# Patient Record
Sex: Male | Born: 1955 | Race: Black or African American | Hispanic: No | Marital: Single | Smoking: Former smoker
Health system: Southern US, Community
[De-identification: ages and names within clinical notes are randomized; demographics above are authoritative.]

## PROBLEM LIST (undated history)

## (undated) DIAGNOSIS — K4090 Unilateral inguinal hernia, without obstruction or gangrene, not specified as recurrent: Secondary | ICD-10-CM

## (undated) DIAGNOSIS — D849 Immunodeficiency, unspecified: Secondary | ICD-10-CM

## (undated) DIAGNOSIS — I219 Acute myocardial infarction, unspecified: Secondary | ICD-10-CM

## (undated) DIAGNOSIS — Z21 Asymptomatic human immunodeficiency virus [HIV] infection status: Secondary | ICD-10-CM

## (undated) DIAGNOSIS — B20 Human immunodeficiency virus [HIV] disease: Secondary | ICD-10-CM

## (undated) DIAGNOSIS — I2510 Atherosclerotic heart disease of native coronary artery without angina pectoris: Secondary | ICD-10-CM

## (undated) DIAGNOSIS — I509 Heart failure, unspecified: Secondary | ICD-10-CM

## (undated) DIAGNOSIS — E785 Hyperlipidemia, unspecified: Secondary | ICD-10-CM

## (undated) HISTORY — PX: COLONOSCOPY: SHX174

## (undated) HISTORY — DX: Hyperlipidemia, unspecified: E78.5

## (undated) HISTORY — PX: HERNIA REPAIR: SHX51

## (undated) HISTORY — PX: FRACTURE SURGERY: SHX138

## (undated) HISTORY — DX: Atherosclerotic heart disease of native coronary artery without angina pectoris: I25.10

## (undated) HISTORY — PX: CORONARY ANGIOPLASTY: SHX604

## (undated) HISTORY — DX: Heart failure, unspecified: I50.9

## (undated) HISTORY — PX: SURGERY SCROTAL / TESTICULAR: SUR1316

---

## 1999-11-29 ENCOUNTER — Encounter: Payer: Self-pay | Admitting: Emergency Medicine

## 1999-11-29 ENCOUNTER — Emergency Department (HOSPITAL_COMMUNITY): Admission: EM | Admit: 1999-11-29 | Discharge: 1999-11-29 | Payer: Self-pay | Admitting: Emergency Medicine

## 1999-12-01 ENCOUNTER — Ambulatory Visit (HOSPITAL_COMMUNITY): Admission: RE | Admit: 1999-12-01 | Discharge: 1999-12-02 | Payer: Self-pay | Admitting: Oral Surgery

## 2002-01-18 ENCOUNTER — Emergency Department (HOSPITAL_COMMUNITY): Admission: EM | Admit: 2002-01-18 | Discharge: 2002-01-18 | Payer: Self-pay | Admitting: Emergency Medicine

## 2002-01-18 ENCOUNTER — Encounter: Payer: Self-pay | Admitting: Family Medicine

## 2002-01-29 ENCOUNTER — Encounter: Admission: RE | Admit: 2002-01-29 | Discharge: 2002-01-29 | Payer: Self-pay | Admitting: Internal Medicine

## 2003-08-30 ENCOUNTER — Emergency Department (HOSPITAL_COMMUNITY): Admission: EM | Admit: 2003-08-30 | Discharge: 2003-08-30 | Payer: Self-pay | Admitting: Emergency Medicine

## 2004-11-09 ENCOUNTER — Inpatient Hospital Stay (HOSPITAL_COMMUNITY): Admission: EM | Admit: 2004-11-09 | Discharge: 2004-11-11 | Payer: Self-pay | Admitting: Emergency Medicine

## 2004-11-09 ENCOUNTER — Encounter: Payer: Self-pay | Admitting: Emergency Medicine

## 2005-03-18 ENCOUNTER — Emergency Department (HOSPITAL_COMMUNITY): Admission: EM | Admit: 2005-03-18 | Discharge: 2005-03-18 | Payer: Self-pay | Admitting: Emergency Medicine

## 2006-12-05 ENCOUNTER — Emergency Department (HOSPITAL_COMMUNITY): Admission: EM | Admit: 2006-12-05 | Discharge: 2006-12-05 | Payer: Self-pay | Admitting: Emergency Medicine

## 2007-03-11 ENCOUNTER — Emergency Department (HOSPITAL_COMMUNITY): Admission: EM | Admit: 2007-03-11 | Discharge: 2007-03-11 | Payer: Self-pay | Admitting: Emergency Medicine

## 2007-06-26 ENCOUNTER — Ambulatory Visit: Payer: Self-pay | Admitting: Family Medicine

## 2007-07-13 ENCOUNTER — Ambulatory Visit: Payer: Self-pay | Admitting: Internal Medicine

## 2007-08-10 ENCOUNTER — Ambulatory Visit: Payer: Self-pay | Admitting: Internal Medicine

## 2007-11-11 ENCOUNTER — Ambulatory Visit: Payer: Self-pay | Admitting: Internal Medicine

## 2008-02-10 ENCOUNTER — Ambulatory Visit: Payer: Self-pay | Admitting: Internal Medicine

## 2008-02-15 ENCOUNTER — Emergency Department (HOSPITAL_COMMUNITY): Admission: EM | Admit: 2008-02-15 | Discharge: 2008-02-15 | Payer: Self-pay | Admitting: Emergency Medicine

## 2008-05-10 ENCOUNTER — Emergency Department (HOSPITAL_COMMUNITY): Admission: EM | Admit: 2008-05-10 | Discharge: 2008-05-10 | Payer: Self-pay | Admitting: Emergency Medicine

## 2008-05-12 ENCOUNTER — Emergency Department (HOSPITAL_COMMUNITY): Admission: EM | Admit: 2008-05-12 | Discharge: 2008-05-12 | Payer: Self-pay | Admitting: Emergency Medicine

## 2008-05-25 ENCOUNTER — Ambulatory Visit (HOSPITAL_COMMUNITY): Admission: RE | Admit: 2008-05-25 | Discharge: 2008-05-26 | Payer: Self-pay | Admitting: Specialist

## 2008-08-11 ENCOUNTER — Encounter: Admission: RE | Admit: 2008-08-11 | Discharge: 2008-08-29 | Payer: Self-pay | Admitting: Specialist

## 2008-09-08 ENCOUNTER — Emergency Department (HOSPITAL_COMMUNITY): Admission: EM | Admit: 2008-09-08 | Discharge: 2008-09-08 | Payer: Self-pay | Admitting: Emergency Medicine

## 2009-03-26 ENCOUNTER — Emergency Department (HOSPITAL_COMMUNITY): Admission: EM | Admit: 2009-03-26 | Discharge: 2009-03-26 | Payer: Self-pay | Admitting: Emergency Medicine

## 2009-03-28 ENCOUNTER — Emergency Department (HOSPITAL_COMMUNITY): Admission: EM | Admit: 2009-03-28 | Discharge: 2009-03-29 | Payer: Self-pay | Admitting: Emergency Medicine

## 2009-06-05 ENCOUNTER — Emergency Department (HOSPITAL_COMMUNITY): Admission: EM | Admit: 2009-06-05 | Discharge: 2009-06-05 | Payer: Self-pay | Admitting: Emergency Medicine

## 2009-06-15 ENCOUNTER — Emergency Department (HOSPITAL_COMMUNITY): Admission: EM | Admit: 2009-06-15 | Discharge: 2009-06-15 | Payer: Self-pay | Admitting: Emergency Medicine

## 2009-07-04 ENCOUNTER — Emergency Department (HOSPITAL_COMMUNITY): Admission: EM | Admit: 2009-07-04 | Discharge: 2009-07-04 | Payer: Self-pay | Admitting: Emergency Medicine

## 2010-11-29 LAB — RAPID STREP SCREEN (MED CTR MEBANE ONLY): Streptococcus, Group A Screen (Direct): NEGATIVE

## 2010-12-01 LAB — POCT CARDIAC MARKERS
CKMB, poc: <1 ng/mL — ABNORMAL LOW (ref 1.0–8.0)
Myoglobin, poc: 106 ng/mL (ref 12–200)
Troponin i, poc: <0.05 ng/mL (ref 0.00–0.09)

## 2010-12-01 LAB — COMPREHENSIVE METABOLIC PANEL
ALT: 17 U/L (ref 0–53)
AST: 30 U/L (ref 0–37)
Albumin: 3.4 g/dL — ABNORMAL LOW (ref 3.5–5.2)
Alkaline Phosphatase: 88 U/L (ref 39–117)
BUN: 4 mg/dL — ABNORMAL LOW (ref 6–23)
CO2: 30 mEq/L (ref 19–32)
Calcium: 9.2 mg/dL (ref 8.4–10.5)
Calcium: 9.5 mg/dL (ref 8.4–10.5)
Chloride: 96 mEq/L (ref 96–112)
Creatinine, Ser: 0.74 mg/dL (ref 0.4–1.5)
GFR calc Af Amer: >60 mL/min (ref >60)
GFR calc Af Amer: >60 mL/min (ref >60)
GFR calc non Af Amer: >60 mL/min (ref >60)
Potassium: 3.6 mEq/L (ref 3.5–5.1)
Sodium: 136 mEq/L (ref 135–145)
Total Bilirubin: 0.8 mg/dL (ref 0.3–1.2)
Total Protein: 7.5 g/dL (ref 6.0–8.3)

## 2010-12-01 LAB — CBC
HCT: 38.3 % — ABNORMAL LOW (ref 39.0–52.0)
Hemoglobin: 13.2 g/dL (ref 13.0–17.0)
MCHC: 34.5 g/dL (ref 30.0–36.0)
MCV: 95.5 fL (ref 78.0–100.0)
Platelets: 310 10*3/uL (ref 150–400)
RBC: 4.01 MIL/uL — ABNORMAL LOW (ref 4.22–5.81)
RDW: 14.1 % (ref 11.5–15.5)
WBC: 5.2 10*3/uL (ref 4.0–10.5)

## 2010-12-01 LAB — DIFFERENTIAL
Basophils Absolute: 0 10*3/uL (ref 0.0–0.1)
Basophils Relative: 1 % (ref 0–1)
Eosinophils Absolute: 0.1 K/uL (ref 0.0–0.7)
Eosinophils Relative: 1 % (ref 0–5)
Lymphocytes Relative: 32 % (ref 12–46)
Lymphs Abs: 1.7 10*3/uL (ref 0.7–4.0)
Monocytes Absolute: 0.6 K/uL (ref 0.1–1.0)
Monocytes Relative: 12 % (ref 3–12)
Neutro Abs: 2.9 10*3/uL (ref 1.7–7.7)
Neutrophils Relative %: 54 % (ref 43–77)

## 2010-12-01 LAB — COMPREHENSIVE METABOLIC PANEL WITH GFR
ALT: 22 U/L (ref 0–53)
Albumin: 3.5 g/dL (ref 3.5–5.2)
Alkaline Phosphatase: 95 U/L (ref 39–117)
Glucose, Bld: 94 mg/dL (ref 70–99)
Potassium: 3.3 meq/L — ABNORMAL LOW (ref 3.5–5.1)
Sodium: 136 meq/L (ref 135–145)
Total Protein: 7.8 g/dL (ref 6.0–8.3)

## 2010-12-01 LAB — POCT I-STAT, CHEM 8
BUN: 5 mg/dL — ABNORMAL LOW (ref 6–23)
Calcium, Ion: 1.13 mmol/L (ref 1.12–1.32)
Chloride: 95 mEq/L — ABNORMAL LOW (ref 96–112)
Creatinine, Ser: 0.9 mg/dL (ref 0.4–1.5)
Glucose, Bld: 97 mg/dL (ref 70–99)
HCT: 41 % (ref 39.0–52.0)
Hemoglobin: 13.9 g/dL (ref 13.0–17.0)
Potassium: 3.4 mEq/L — ABNORMAL LOW (ref 3.5–5.1)
Sodium: 138 mEq/L (ref 135–145)
TCO2: 29 mmol/L (ref 0–100)

## 2010-12-10 LAB — DIFFERENTIAL
Basophils Absolute: 0 10*3/uL (ref 0.0–0.1)
Eosinophils Relative: 1 % (ref 0–5)
Lymphocytes Relative: 38 % (ref 12–46)
Lymphs Abs: 1.6 10*3/uL (ref 0.7–4.0)
Neutro Abs: 2 10*3/uL (ref 1.7–7.7)
Neutrophils Relative %: 49 % (ref 43–77)

## 2010-12-10 LAB — CBC
HCT: 42.6 % (ref 39.0–52.0)
Platelets: 304 10*3/uL (ref 150–400)
RDW: 14.9 % (ref 11.5–15.5)
WBC: 4.2 10*3/uL (ref 4.0–10.5)

## 2010-12-10 LAB — POCT I-STAT, CHEM 8
BUN: 4 mg/dL — ABNORMAL LOW (ref 6–23)
Potassium: 4.2 mEq/L (ref 3.5–5.1)
Sodium: 139 mEq/L (ref 135–145)
TCO2: 29 mmol/L (ref 0–100)

## 2010-12-10 LAB — HEMOCCULT GUIAC POC 1CARD (OFFICE): Fecal Occult Bld: POSITIVE

## 2011-01-08 NOTE — Op Note (Signed)
NAMELYMON, KIDNEY NO.:  [id number]   MEDICAL RECORD NO.:  [id number]          PATIENT TYPE:  OIB   LOCATION:  1538                         FACILITY:  [facility]   PHYSICIAN:  Jene Every, M.D.    DATE OF BIRTH:  [birth year]   DATE OF PROCEDURE:  05/25/2008  DATE OF DISCHARGE:                               OPERATIVE REPORT   PREOPERATIVE DIAGNOSIS:  Bimalleolar ankle fracture with ruptured  syndesmosis.   POSTOPERATIVE DIAGNOSIS:  Bimalleolar ankle fracture with ruptured  syndesmosis, acute on chronic fracture.   PROCEDURE PERFORMED:  1. Open reduction internal fixation bimalleolar ankle fracture.  2. Syndesmosis debridement of the ankle joint.   SURGEON:  Jene Every, M.D.   ASSISTANT:  Roma Schanz, P.A.   BRIEF HISTORY:  The patient is a 55 year old male who presented to the  acute care clinic with an acute injury to the right ankle.  Radiographs  indicated a medial malleolar displaced fracture with widened  syndesmosis, fracture of the fibula, question the acuity of the injury.  However, the patient denied any previous injury of the ankle.  It was  therefore felt to be acute.  It was indicated for open reduction  internal fixation.  Risks and benefits were discussed including  bleeding, infection, nonunion, malunion, need for revision, hardware  removal, DVT, PE and anesthetic complications, etc.   TECHNIQUE:  With the patient in supine position after adequate general  anesthesia, 2 grams Kefzol, the right lower extremity was prepped and  draped and exsanguinated in the usual sterile fashion.  Thigh tourniquet  inflated to 300 mmHg.  Curvilinear incision based upon the medial  malleolus was performed.  Subcutaneous tissue was dissected.  Electrocautery utilized to achieve hemostasis.  The periosteum over the  lateral malleolus was incised and subperiosteally elevated to expose the  fracture.  The fracture was just below the level of the plafond  and it  actually appeared to have chronic configuration to it.  We found the tip  of the medial malleolus in the joint.  It appeared to be chronic.  We  curetted the fracture ends of the tibia and of the fracture fragment.  We mobilized the fragment, skeletonized it though we left ligamentous  attachments to it.  This was then pinned to the medial malleolus  utilizing two 2-0 K-wires after transfixion with a tenaculum.  We  curetted the bone to bleeding tissue and then I used a tenaculum for  compression of the bone with expression of hematoma and then placed two  K-wires, clipped them and bent them over and impacting them into the  bone.  Prior to that we examined the joint.  I irrigated this and some  cartilaginous damage was noted.  On x-ray in the AP and lateral plane it  was found to be satisfactory.  We then used an incision over the distal  fibula.  There was small avulsion of the fibula so it would not require  ORIF.  The syndesmosis was widened and we therefore used the [employer].  After roughing up the syndesmosis we used  a tong to compress it and then  put a two hole plate and put two 0.35 screws, angled it from lateral to  medial, posterior to anterior with insertion of fully threaded cortical  screws 40 and 45 mm in length with excellent purchase.  The distal screw  had a smaller head for the cannulated screw.  We tried to change it out  but the screw head was stripped so we left it.  We felt that it was  adherent to the three hole plate that we placed on the lateral aspect of  the fibula using two of the distal holes.  With the ankle in  dorsiflexion provided compression and then released the [employer].  In  the AP and lateral plane there was excellent restoration of the mortise.  The wound was copiously irrigated and repaired the incision medially  with 2-0 Vicryl simple sutures and laterally in the skin with staples.  Tourniquet was deflated and there was adequate  vascularization of the  lower extremity appreciated.  A short-leg cast in neutral, well-padded  was then placed with fiberglass.  Cold water was used.  The patient was  then extubated without difficulty and transferred to the recovery room  in satisfactory condition.  The patient tolerated the procedure with no  complications.  Tourniquet time was an hour and 15 minutes.      Jene Every, M.D.  Electronically Signed     JB/MEDQ  D:  05/25/2008  T:  05/26/2008  Job:  [id number]
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
## 2011-05-23 LAB — POCT I-STAT, CHEM 8
BUN: 11
Calcium, Ion: 1.19
HCT: 48
Hemoglobin: 16.3
Sodium: 138
TCO2: 31

## 2011-05-23 LAB — CBC
HCT: 44.3
MCHC: 35.4
Platelets: 295
RBC: 4.73
RDW: 14.1
WBC: 5.5

## 2011-05-23 LAB — DIFFERENTIAL
Basophils Absolute: 0
Basophils Relative: 1
Eosinophils Relative: 1
Lymphocytes Relative: 38

## 2011-05-23 LAB — GC/CHLAMYDIA PROBE AMP, GENITAL
Chlamydia, DNA Probe: NEGATIVE
GC Probe Amp, Genital: NEGATIVE

## 2011-05-27 LAB — DIFFERENTIAL
Basophils Relative: 1
Eosinophils Absolute: 0
Lymphs Abs: 1.8
Monocytes Absolute: 0.6
Monocytes Relative: 12
Neutrophils Relative %: 47

## 2011-05-27 LAB — COMPREHENSIVE METABOLIC PANEL WITH GFR
ALT: 20
AST: 22
Albumin: 3.8
Alkaline Phosphatase: 91
BUN: 9
CO2: 29
Calcium: 9.5
Chloride: 105
Creatinine, Ser: 0.72
GFR calc non Af Amer: >60
Glucose, Bld: 113 — ABNORMAL HIGH
Potassium: 4.1
Sodium: 137
Total Bilirubin: 1.2
Total Protein: 7.6

## 2011-05-27 LAB — CBC
MCHC: 34.2
Platelets: 398
RDW: 13.9

## 2011-09-07 ENCOUNTER — Encounter (HOSPITAL_COMMUNITY): Payer: Self-pay

## 2011-09-07 ENCOUNTER — Emergency Department (HOSPITAL_COMMUNITY)
Admission: EM | Admit: 2011-09-07 | Discharge: 2011-09-07 | Disposition: A | Discharge status: Home / Self Care | Payer: Medicare Other | Attending: Emergency Medicine | Admitting: Emergency Medicine

## 2011-09-07 DIAGNOSIS — H6590 Unspecified nonsuppurative otitis media, unspecified ear: Secondary | ICD-10-CM | POA: Insufficient documentation

## 2011-09-07 DIAGNOSIS — H6591 Unspecified nonsuppurative otitis media, right ear: Secondary | ICD-10-CM

## 2011-09-07 DIAGNOSIS — J3489 Other specified disorders of nose and nasal sinuses: Secondary | ICD-10-CM | POA: Insufficient documentation

## 2011-09-07 DIAGNOSIS — R599 Enlarged lymph nodes, unspecified: Secondary | ICD-10-CM | POA: Insufficient documentation

## 2011-09-07 DIAGNOSIS — H9209 Otalgia, unspecified ear: Secondary | ICD-10-CM | POA: Insufficient documentation

## 2011-09-07 DIAGNOSIS — R0982 Postnasal drip: Secondary | ICD-10-CM | POA: Insufficient documentation

## 2011-09-07 NOTE — ED Notes (Signed)
Pt in from home with right ear discomfort x1 week states feels as if its clogged denies drainage

## 2011-09-07 NOTE — ED Provider Notes (Signed)
History     CSN: [id number]  Arrival date & time 09/07/11  1528   First MD Initiated Contact with Patient 09/07/11 1904      Chief Complaint  Patient presents with  . Ear Fullness    (Consider location/radiation/quality/duration/timing/severity/associated sxs/prior treatment) Patient is a 56 y.o. male presenting with plugged ear sensation. The history is provided by the patient.  Ear Fullness This is a new problem. The current episode started in the past 7 days. The problem occurs constantly. The problem has been unchanged. Associated symptoms include congestion and swollen glands. Pertinent negatives include no chest pain, chills, coughing, fever, headaches, neck pain, rash or sore throat. Associated symptoms comments: Positive nasal congestion, rhinorrhea, PND. The symptoms are aggravated by nothing. Treatments tried: "popping" ears. The treatment provided no relief.  Ear Fullness This is a new problem. The current episode started in the past 7 days. The problem occurs constantly. The problem has been unchanged. Pertinent negatives include no chest pain and no headaches. Associated symptoms comments: Positive nasal congestion, rhinorrhea, PND. The symptoms are aggravated by nothing. Treatments tried: "popping" ears. The treatment provided no relief.    History reviewed. No pertinent past medical history.  History reviewed. No pertinent past surgical history.  No family history on file.  History  Substance Use Topics  . Smoking status: Current Some Day Smoker  . Smokeless tobacco: Not on file  . Alcohol Use: Yes      Review of Systems  Constitutional: Negative for fever and chills.  HENT: Positive for ear pain, congestion, rhinorrhea and postnasal drip. Negative for hearing loss, sore throat, neck pain, tinnitus and ear discharge.   Respiratory: Negative for cough and chest tightness.   Cardiovascular: Negative for chest pain.  Skin: Negative for rash.  Neurological:  Negative for headaches.    Allergies  Review of patient's allergies indicates no known allergies.  Home Medications   Current Outpatient Rx  Name Route Sig Dispense Refill  . CYCLOBENZAPRINE HCL 10 MG PO TABS Oral Take 10 mg by mouth 3 (three) times daily as needed. For back and shoulder pain    . EFAVIRENZ-EMTRICITAB-TENOFOVIR 600-200-300 MG PO TABS Oral Take 1 tablet by mouth at bedtime.    . OXYCODONE-ACETAMINOPHEN 10-325 MG PO TABS Oral Take 1 tablet by mouth every 8 (eight) hours as needed. For pain      BP 112/79  Pulse 79  Temp(Src) 98.6 F (37 C) (Oral)  SpO2 98%  Physical Exam  Nursing note and vitals reviewed. Constitutional: He is oriented to person, place, and time. He appears well-developed and well-nourished. No distress.  HENT:  Head: Normocephalic and atraumatic.  Nose: Nose normal.  Mouth/Throat: Oropharynx is clear and moist. No oropharyngeal exudate.       Right TM with serous effusion, no erythema  Eyes: Conjunctivae are normal. Pupils are equal, round, and reactive to light.  Neck: Normal range of motion. Neck supple.  Cardiovascular: Normal rate and regular rhythm.   Pulmonary/Chest: Effort normal. No respiratory distress.  Abdominal: Soft. He exhibits no distension. There is no tenderness.  Musculoskeletal: He exhibits no edema and no tenderness.  Lymphadenopathy:    He has no cervical adenopathy.  Neurological: He is alert and oriented to person, place, and time. No cranial nerve deficit. Coordination normal.  Skin: Skin is warm and dry. No rash noted.    ED Course  Procedures (including critical care time)  Labs Reviewed - No data to display No results found.  MDM  OM with effusion, NO abx necessary. Discussed use of OTC decongestant    Medical screening examination/treatment/procedure(s) were performed by non-physician practitioner and as supervising physician I was immediately available for consultation/collaboration. Osvaldo Human, M.D.     [address] [place], [place] 09/07/11 1927  Carleene Cooper III, MD 09/08/11 [phone]

## 2011-09-23 DIAGNOSIS — D013 Carcinoma in situ of anus and anal canal: Secondary | ICD-10-CM | POA: Insufficient documentation

## 2012-12-09 ENCOUNTER — Encounter (HOSPITAL_COMMUNITY): Payer: Self-pay | Admitting: Emergency Medicine

## 2012-12-09 ENCOUNTER — Emergency Department (HOSPITAL_COMMUNITY)
Admission: EM | Admit: 2012-12-09 | Discharge: 2012-12-09 | Disposition: A | Discharge status: Home / Self Care | Payer: Medicare Other | Attending: Emergency Medicine | Admitting: Emergency Medicine

## 2012-12-09 DIAGNOSIS — K6289 Other specified diseases of anus and rectum: Secondary | ICD-10-CM | POA: Insufficient documentation

## 2012-12-09 DIAGNOSIS — F17200 Nicotine dependence, unspecified, uncomplicated: Secondary | ICD-10-CM | POA: Insufficient documentation

## 2012-12-09 DIAGNOSIS — Z21 Asymptomatic human immunodeficiency virus [HIV] infection status: Secondary | ICD-10-CM | POA: Insufficient documentation

## 2012-12-09 DIAGNOSIS — Z79899 Other long term (current) drug therapy: Secondary | ICD-10-CM | POA: Insufficient documentation

## 2012-12-09 HISTORY — DX: Human immunodeficiency virus (HIV) disease: B20

## 2012-12-09 HISTORY — DX: Asymptomatic human immunodeficiency virus (hiv) infection status: Z21

## 2012-12-09 HISTORY — DX: Immunodeficiency, unspecified: D84.9

## 2012-12-09 MED ORDER — TUCKS 50 % EX PADS: 1.0000 | MEDICATED_PAD | Freq: Four times a day (QID) | CUTANEOUS | Status: DC | PRN

## 2012-12-09 MED ORDER — HYDROCORTISONE ACE-PRAMOXINE 1-1 % RE FOAM: 1.0000 | Freq: Two times a day (BID) | RECTAL | Status: DC

## 2012-12-09 NOTE — ED Notes (Signed)
NAD noted at time of d/c home 

## 2012-12-09 NOTE — ED Notes (Signed)
ED-P in room at this time reviewing pt d/c orders

## 2012-12-09 NOTE — ED Provider Notes (Signed)
History     CSN: [id number]  Arrival date & time 12/09/12  1013   First MD Initiated Contact with Patient 12/09/12 1045      Chief Complaint  Patient presents with  . Wrist Pain  . Rectal Pain    (Consider location/radiation/quality/duration/timing/severity/associated sxs/prior treatment) Patient is a 57 y.o. male presenting with wrist pain. The history is provided by the patient.  Wrist Pain   patient here complaining of rectal pain after a biopsy done 3 weeks ago. No fever or chills. No nausea or vomiting. History of chronic rectal complaints. Has been using medications without relief. Pain worse with defecation. No severe drainage appreciated. Has used sitz baths without relief.  Past Medical History  Diagnosis Date  . Immune deficiency disorder   . HIV (human immunodeficiency virus infection)     Past Surgical History  Procedure Laterality Date  . Fracture surgery      left ankle and right foot  . Colonoscopy    . Hernia repair    . Surgery scrotal / testicular      nondecended testes    No family history on file.  History  Substance Use Topics  . Smoking status: Current Some Day Smoker -- 0.50 packs/day    Types: Cigarettes  . Smokeless tobacco: Not on file  . Alcohol Use: 1.2 oz/week    2 Cans of beer per week     Comment: beer daily      Review of Systems  All other systems reviewed and are negative.    Allergies  Review of patient's allergies indicates no known allergies.  Home Medications   Current Outpatient Rx  Name  Route  Sig  Dispense  Refill  . efavirenz-emtrictabine-tenofovir (ATRIPLA) 600-200-300 MG per tablet   Oral   Take 1 tablet by mouth at bedtime.         [name] oxyCODONE-acetaminophen (PERCOCET) 10-325 MG per tablet   Oral   Take 1 tablet by mouth every 8 (eight) hours as needed. For pain           BP 120/74  Pulse 83  Temp(Src) 98.1 F (36.7 C) (Oral)  Resp 20  Ht 5\' 7"  (1.702 m)  Wt 220 lb (99.791 kg)  BMI 34.45  kg/m2  SpO2 100%  Physical Exam  Nursing note and vitals reviewed. Constitutional: He is oriented to person, place, and time. He appears well-developed and well-nourished.  Non-toxic appearance. No distress.  HENT:  Head: Normocephalic and atraumatic.  Eyes: Conjunctivae, EOM and lids are normal. Pupils are equal, round, and reactive to light.  Neck: Normal range of motion. Neck supple. No tracheal deviation present. No mass present.  Cardiovascular: Normal rate, regular rhythm and normal heart sounds.  Exam reveals no gallop.   No murmur heard. Pulmonary/Chest: Effort normal and breath sounds normal. No stridor. No respiratory distress. He has no decreased breath sounds. He has no wheezes. He has no rhonchi. He has no rales.  Abdominal: Soft. Normal appearance and bowel sounds are normal. He exhibits no distension. There is no tenderness. There is no rebound and no CVA tenderness.  Genitourinary:  Ulcerations noted around the patient's anus without evidence of abscess. No fluctuance appreciated. No erythema noted. Patient has deferred the digital exam  Musculoskeletal: Normal range of motion. He exhibits no edema and no tenderness.  Neurological: He is alert and oriented to person, place, and time. He has normal strength. No cranial nerve deficit or sensory deficit. GCS eye subscore is 4.  GCS verbal subscore is 5. GCS motor subscore is 6.  Skin: Skin is warm and dry. No abrasion and no rash noted.  Psychiatric: He has a normal mood and affect. His speech is normal and behavior is normal.    ED Course  Procedures (including critical care time)  Labs Reviewed - No data to display No results found.   No diagnosis found.    MDM  Will place patient on Proctofoam and Tucks pads. Given GI referral        Toy Baker, MD 12/09/12 1105

## 2012-12-09 NOTE — ED Notes (Signed)
Pt with c/o rectal pain after endoscopy with biopsy x3 weeks ago done at [facility]. Pt reports he is taking pain medication for the rectal pain that is not helping along with sitz baths. Also c/o right wrist and FA pain that he has had "for a couple of months". Pt lying on his side with head propped onto right hand.

## 2013-01-05 ENCOUNTER — Ambulatory Visit (INDEPENDENT_AMBULATORY_CARE_PROVIDER_SITE_OTHER): Payer: Self-pay | Admitting: General Surgery

## 2013-01-06 ENCOUNTER — Ambulatory Visit (INDEPENDENT_AMBULATORY_CARE_PROVIDER_SITE_OTHER): Payer: Medicare Other | Admitting: General Surgery

## 2013-01-06 ENCOUNTER — Encounter (INDEPENDENT_AMBULATORY_CARE_PROVIDER_SITE_OTHER): Payer: Self-pay | Admitting: General Surgery

## 2013-01-06 VITALS — BP 126/78 | HR 92 | Temp 97.7°F | Ht 69.0 in | Wt 205.0 lb

## 2013-01-06 DIAGNOSIS — D013 Carcinoma in situ of anus and anal canal: Secondary | ICD-10-CM | POA: Insufficient documentation

## 2013-01-06 DIAGNOSIS — B20 Human immunodeficiency virus [HIV] disease: Secondary | ICD-10-CM | POA: Insufficient documentation

## 2013-01-06 NOTE — Patient Instructions (Signed)
Anal Warts and Anal Dysplasia Expanded  What are anal warts? Anal warts (also called "condyloma acuminata") are a condition that affects the area around and inside the anus. They may also affect the skin of the genital area. They first appear as tiny spots or growths, perhaps as small as the head of a pin, and may grow quite large and cover the entire anal area. They usually appear as a flesh or brownish color. Usually, they do not cause pain or discomfort and patients may be unaware that the warts are present. Some patients will experience symptoms such as itching, bleeding, mucus discharge and/or a feeling of a lump or mass in the anal area.  What causes anal warts? Warts are caused by the human papilloma virus (HPV), which is transmitted from person to person by direct contact. HPV is considered to be the most common sexually transmitted disease (STD). You may be upset when you are given this diagnosis and it is important to note that anal intercourse is not necessary to develop anal condylomata. Any contact exposure to the anal area (hand contact, secretions from a sexual partner) can result in HPV infection. Exposure to the virus could have occurred many years ago or from prior sexual partners, but you may have just recently developed the actual warts. How are anal warts diagnosed? Although potentially sensitive and difficult to talk about, your doctor may inquire as to the presence or absence of risk factors to include a history of anal intercourse, a positive HIV test or a chronically weakened immune system (medications for organ transplant patients, inflammatory bowel disease, rheumatoid arthritis, etc).  Physical examination should focus primarily on the anorectal examination and evaluation of the perineum (pelvic region) that includes the penile or vaginal area to look for warts. Digital rectal examination should be performed to rule out any mass. Anoscopy is typically performed to look within the  anal canal for additional warts. This involves inserting a small instrument about the size of a finger into your anus to help visualize the area. Speculum examination may also be performed to aid in vaginal examination in women.  Can I prevent myself from getting warts? The safest way to protect yourself from getting exposed to HPV or any other STD, is to use safe sex techniques. Abstain from sexual contact with individuals who have anal (or genital) warts. Since many individuals may be unaware that they suffer from this condition, sexual abstinence, condom protection or limiting sexual contact to a single partner will reduce the contagious virus that causes warts. However, using condoms whenever having any kind of intercourse may reduce, but not completely eliminate, the risk of HPV infection, as HPV is spread by skin-to-skin contact and can live in areas not covered by a condom. The U[place]. Food and Drug Administration (FDA) has approved the vaccine Gardasil (vaccine against certain types of HPV that more commonly cause cervical and other HPV-related cancers) in certain patients age 9 to 26 prior to HPV exposure (sexual activity) to prevent the development of HPV- related cancers and associated precancerous lesions (called dysplasia). Your physician may recommend you to be evaluated if you would potentially be a candidate for this vaccine. However, the vaccine's role to prevent anal warts and anal cancer is unknown. Do these warts always need to be removed? Yes. If they are not removed, the warts usually grow larger and multiply. Left untreated, warts may lead to an increased risk of anal cancer in the affected area. Fortunately, the risk of anal   cancer is still very rare. What treatments are available for anal warts? Topical options for treating warts 1. Medications: If warts are very small and are located only on the skin around the anus, they may be treated with a topical medication in the office and  sometimes at home. Common topical medications applied directly to the warts are podophyllin, trichloroacetic acid and bichloroacetic acid. These office treatments do not require anesthesia and only take a few minutes to apply to the warts. Minor burning or discomfort may be experienced after treatment and, thus, most patients can return to work after the procedure. Your physician will recommend when to wash off the medication after treatment. Topical agents that can be applied at home on small warts include Imiquimod or 5-fluorouracial (5-FU), although how well they work to eliminate anal warts completely is unknown. Side effects include skin irritation, burning and painful ulcerations of the skin. If you develop severe side effects, immediately stop using the cream and contact your physician. 2. Cryotherapy: Anal warts may also be treated in the physician's office by freezing the warts with liquid nitrogen. Similar recovery as the topical agents mentioned above is expected. Surgical options for treating warts: Surgery provides immediate results, but must be performed using either a local anesthetic - such as Novocaine - or a general or spinal anesthetic, depending on the number and exact location of warts being treated. Fulguration (burning), surgical excision (removal), or a combination of both, are used to treat larger external and internal anal warts. Must I be hospitalized for surgical treatment? Surgical treatment of anal warts is usually performed as outpatient surgery.  How much time will I lose from work after surgical treatment? Most people are moderately uncomfortable for a few days after treatment, and pain medication may be prescribed. Depending on the extent of the disease, some people return to work the next day, while others may remain out of work for several days to weeks. Pain, discomfort and slight bleeding are expected in the recovery period and may last at some level for several weeks.  Excessive bleeding is abnormal and your physician should be informed immediately if you experience large amounts of bleeding. Clear, yellowish or blood tinged drainage or moisture will be expected for days to weeks after the procedure. Placement of a pad and frequent dressing changes will help lessen the moisture and itching associated with the drainage. Will a single treatment cure the problem? When warts are extensive, your surgeon may wish to perform the surgery in stages. Additionally, recurrent warts are common in over 50% of patients. The virus that causes the warts can live concealed in the skin that appears normal for several months before another wart develops. As new warts develop, they usually can be treated in the physician's office. Sometimes new warts develop so rapidly that office treatment may not be possible or could be quite uncomfortable. In these situations, a second, and occasionally, third outpatient surgical visit may be recommended. How long is treatment usually continued? Follow-up visits are necessary at frequent intervals for several months after the last wart is observed to be certain that no new warts occur. It is important to see your physician on a routine basis as recommended by her/him or if you notice any new lesions or new symptoms (pain, rectal bleeding). What can be done to avoid getting these warts again? In some cases, warts may recur repeatedly after successful removal, since the virus that causes the warts often persists in a dormant state in the   skin. Discuss with you physician how often you should be examined for recurrent warts.  To prevent further spread of HPV, safe sex practices are recommended and include sexual abstinence, condom protection or limiting sexual contact to single partner. As a precaution, sexual partners ought to be checked for warts and other sexual transmitted diseases (STD), even if they have no symptoms. Your physician may also recommend that  you be tested for other STDs.  What is anal dysplasia? Some warts have abnormal changes seen by the pathologist when they look at the removed wart under the microscope. These changes are called anal dysplasia and can be graded as to how advanced their dysplasia or abnormal changes are under the microscope. These changes are referred to by physicians as low-grade and high-grade anal intraepithelial neoplasia (LGAIN/HGAIN). Cells that are becoming malignant or "premalignant", but have not invaded deeper into the skin, are often referred to as HGAIN. While this condition is likely a precursor to anal cancer, this is not anal cancer and is treated differently than anal cancer. Anal dysplasia (LGAIN/HGAIN) is similar to cervical dysplasia (cervical intraepithelial neoplasia or CIN) in that it originates from a HPV infection and can develop into anal and cervical cancer, respectively. Thus, patients with anal dysplasia need close follow up determined by their physician and any new lesions must be evaluated promptly. A gynecologic examination is also recommended in females, as the presence of HGAIN puts a male patient at risk for having CIN. Who is at increased risk for anal dysplasia? Risk factors for anal dysplasia include: 1. Patients with HPV infections (most common) 2. Patients with history of anal intercourse 3. Positive HIV test 4. Cigarette smoking  5. Patients with a weakened immune system from certain medications (solid organ transplantation, RA, IBD) Why do we need to treat anal dysplasia? Once you have anal dysplasia, it rarely disappears. Although still exceedingly uncommon, there is a slight increase risk of anal cancer in patients with a history of anal dysplasia (less than 5%). Its progression in HIV-positive patients seems to be higher. The risk for progression to anal cancer may be as high as 10-50% among HIV-positive patients. How is anal dysplasia diagnosed? Anal dysplasia can be found in  anal warts or sometimes these changes are found incidentally at the time of unrelated anal surgery (i.e. hemorrhoid surgery).  Screening procedures available to detect anal dysplasia include anal cytology and high-resolution anoscopy (HRA). However, they are not universally performed and their role in the management of patients with anal dysplasia is unknown at this time. 1. Anal Papanicolaou (Pap) smear cytology consists of using anal swabs to sample cells from the anal canal and can be used for both screening patients considered high-risk (see list above) and as follow up after anal dysplasia has been treated. Unfortunately, up to 45% of patients can have a false-positive test by anal PAP for anal dysplasia. As well, it is not known if anal PAP improves your outcome or decreases your risk of anal cancer.  2. HRA typically involves the application of temporary stains (3% acetic acid and Lugol's iodine solution) to the anal canal followed by evaluation under high-resolution microscopy to help differentiate normal from abnormal tissue. This is very similar to colposcopy (examination of the cervix) in women who have cervical dysplasia. Directed biopsies are performed for any questionable areas and to identify areas that may need further treatment. How is anal dysplasia treated? 1. Observation alone with close clinical follow-up may be considered for the treatment of   anal dysplasia. There has been much debate regarding the best treatment for anal dysplasia. There is a high risk of recurrence following treatments so physicians may recommend close observation and physical examination every 3-6 months depending on your risk factors and if the screening procedures discussed are available in your area. However, due to the high risk of also having cervical dysplasia, a referral to gynecology is recommended. 2. Topical 5% imiquimod (Aldara) cream or 5% 5-fluorouracil (5-FU) cream may be applied to areas of anal  dysplasia. Local treatment creams may be needed for 9-16 weeks. Up to 90% may have anal lesions disappear, although as many as 50% can recur. Local side effects are very common, occurring in up to 85%, and include skin irritation and hypo-pigmentation (loss of color around the anus), but these stop or reverse after you stop the cream. 3. Photodynamic therapy may be used in select patients. Similar to use in other types of skin cancers, photodynamic therapy has been described in patients with anal dysplasia since 1992. In this process, photosensitizing agents such as 5-aminolevulinic acid creams are applied to the affected area followed by treatment with a specific wavelength laser. Studies have been limited and its role in patients with anal dysplasia is still unknown. 4. Targeted destruction and close clinical long-term follow-up. A variety of surgical techniques to remove anal dysplasia have been used to prevent disease progression. These include wide local excision and targeted therapy using high-resolution anoscopy (HRA). Wide local excision's goal is to remove all of the affected areas with normal surrounding tissue ("negative margins"), although total removal of all disease is often difficult. Sometimes a local flap of normal tissue adjacent to the removed area is used to cover the large defect. There is still a high rate of recurrence of anal dysplasia despite a wide removal of tissue and high rates of complications such as stenosis anal (narrowing of the area) and fecal incontinence. Targeted destruction guided by high-resolution anoscopy is effective to identify, biopsy and destroy anal dysplasia without the long recovery and complications associated with wide local excision. However, there is still a high risk of persistent or recurrent disease, reported in up to 20-80%. Surgical complications such as incontinence and stenosis are generally not seen with HRA, though. Fortunately, both wide local excision  and targeted destruction by HRA have been shown to prevent progression from anal dysplasia to anal cancer. How should I be followed after treatment for anal dysplasia? Patients with anal dysplasia should usually be closely followed long term to prevent or detect recurrence, persistence or progression to anal cancer. Physical examinations may be performed at 3 to 6-month intervals. This approach allows for the treatment of recurrent or persistent dysplasia or the detection of anal cancer. Follow-up generally includes digital rectal examination, anoscopic examination, with or without the aid of magnification or the application of acetic acid and Lugol's solution, and can be performed in an office setting. Anorectal cytology (anal PAP) and/or biopsy may also be included if available in your area. The importance of close follow-up cannot be over emphasized in patients with history of anal dysplasia especially if new lesions develop. What is a colon and rectal surgeon? Colon and rectal surgeons are experts in the surgical and non-surgical treatment of diseases of the colon, rectum and anus. They have completed advanced surgical training in the treatment of these diseases as well as full general surgical training. Board-certified colon and rectal surgeons complete residencies in general surgery and colon and rectal surgery, and pass intensive   examinations conducted by the American Board of Surgery and the American Board of Colon and Rectal Surgery. They are well-versed in the treatment of both benign and malignant diseases of the colon, rectum and anus and are able to perform routine screening examinations and surgically treat conditions if indicated to do so. Disclaimer The American Society of Colon and Rectal Surgeons is dedicated to ensuring high-quality patient care by advancing the science, prevention, and management of disorders and diseases of the colon, rectum, and anus. These brochures are inclusive, and  not prescriptive. Their purpose is to provide information on diseases processes, rather than dictate a specific form of treatment. They are intended for the use of all practitioners, health care workers, and patients who desire information about the management of the conditions addressed by the topics covered in these brochures. It should be recognized that these brochures should not be deemed inclusive of all proper methods of care or exclusive of methods of care reasonably directed to obtaining the same results. The ultimate judgment regarding the propriety of any specific procedure must be made by the physician in light of all the circumstances presented by the individual patient.  author: Jennifer Lowney, MD, FASCRS, on behalf of the ASCRS Public Relations Committee   American Society of Colon & Rectal Surgeons  

## 2013-01-06 NOTE — Progress Notes (Signed)
Chief Complaint  Patient presents with  . New Evaluation    eval rectal pain    HISTORY: SANDERS MANNINEN is a 57 y.o. male who presents to the office with anal pain.  Other symptoms include itching and bleeding.  This had been occurring for about a year.  He has tried hemorrhoid creams and narcotics in the past with little success.  BM's makes the symptoms worse.   It is continuous in nature.  His bowel habits are regular and his bowel movements are soft.  He is being follow at [facility] with HRA and IRC treatments.  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  He has not had any invasive lesions.  He has had HIV for 17 yrs.  He thinks his last CD4 was >800.    Past Medical History  Diagnosis Date  . Immune deficiency disorder   . HIV (human immunodeficiency virus infection)       Past Surgical History  Procedure Laterality Date  . Fracture surgery      left ankle and right foot  . Colonoscopy    . Hernia repair    . Surgery scrotal / testicular      nondecended testes        Current Outpatient Prescriptions  Medication Sig Dispense Refill  . efavirenz-emtrictabine-tenofovir (ATRIPLA) 600-200-300 MG per tablet Take 1 tablet by mouth at bedtime.      . hydrocortisone-pramoxine (PROCTOFOAM HC) rectal foam Place 1 applicator rectally 2 (two) times daily.  10 g  0  . oxyCODONE-acetaminophen (PERCOCET) 10-325 MG per tablet Take 1 tablet by mouth every 8 (eight) hours as needed. For pain      . Witch Hazel (TUCKS) 50 % PADS Apply 1 applicator topically every 6 (six) hours as needed (anal pain).  30 each  0   No current facility-administered medications for this visit.      No Known Allergies    Family History  Problem Relation Age of Onset  . Cancer Mother     lung  . Cancer Brother     lung, liver, and colon    History   Social History  . Marital Status: Single    Spouse Name: N/A    Number of Children: N/A  . Years of Education: N/A   Social History Main Topics   . Smoking status: Current Some Day Smoker -- 0.50 packs/day    Types: Cigarettes  . Smokeless tobacco: None  . Alcohol Use: 1.8 oz/week    3 Cans of beer per week     Comment: beer daily  . Drug Use: Yes    Special: Marijuana  . Sexually Active: None   Other Topics Concern  . None   Social History Narrative  . None      REVIEW OF SYSTEMS - PERTINENT POSITIVES ONLY: Review of Systems - General ROS: negative for - chills, fever or weight loss Hematological and Lymphatic ROS: negative for - bleeding problems, blood clots or bruising Respiratory ROS: no cough, shortness of breath, or wheezing Cardiovascular ROS: no chest pain or dyspnea on exertion Gastrointestinal ROS: no abdominal pain, change in bowel habits, or black or bloody stools Genito-Urinary ROS: no dysuria, trouble voiding, or hematuria  EXAM: Filed Vitals:   01/06/13 1345  BP: 126/78  Pulse: 92  Temp: 97.7 F (36.5 C)    General appearance: alert and cooperative Resp: clear to auscultation bilaterally Cardio: regular rate and rhythm GI: soft, non-tender; bowel  sounds normal; no masses,  no organomegaly  Perianal Findings: multiple scars, no visible condyloma, unable to do DRE due to pain.      ASSESSMENT AND PLAN: RONDEL EPISCOPO is a 57 y.o. M with anal pain.  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  This is being treated with IRC.  He is doing sitz baths 3-4 times a day.  I do not have anything to add to his current treatment.  After reviewing the notes from [facility], I do not have anything else to add.  I have recommended that he continue his surveillance for now.      Vanita Panda, MD Colon and Rectal Surgery / General Surgery [facility]      Visit Diagnoses: 1. HIV disease   2. AIN grade III     Primary Care Physician: Bartholomew Boards, MD

## 2013-01-22 ENCOUNTER — Ambulatory Visit (INDEPENDENT_AMBULATORY_CARE_PROVIDER_SITE_OTHER): Payer: Medicare Other | Admitting: General Surgery

## 2013-06-21 ENCOUNTER — Encounter (HOSPITAL_COMMUNITY): Payer: Self-pay | Admitting: Emergency Medicine

## 2013-06-21 ENCOUNTER — Emergency Department (HOSPITAL_COMMUNITY)
Admission: EM | Admit: 2013-06-21 | Discharge: 2013-06-21 | Disposition: A | Discharge status: Home / Self Care | Payer: Medicare Other | Attending: Emergency Medicine | Admitting: Emergency Medicine

## 2013-06-21 ENCOUNTER — Emergency Department (HOSPITAL_COMMUNITY): Payer: Medicare Other

## 2013-06-21 DIAGNOSIS — N451 Epididymitis: Secondary | ICD-10-CM

## 2013-06-21 DIAGNOSIS — Z79899 Other long term (current) drug therapy: Secondary | ICD-10-CM | POA: Insufficient documentation

## 2013-06-21 DIAGNOSIS — Z885 Allergy status to narcotic agent status: Secondary | ICD-10-CM | POA: Insufficient documentation

## 2013-06-21 DIAGNOSIS — N390 Urinary tract infection, site not specified: Secondary | ICD-10-CM | POA: Insufficient documentation

## 2013-06-21 DIAGNOSIS — R109 Unspecified abdominal pain: Secondary | ICD-10-CM | POA: Insufficient documentation

## 2013-06-21 DIAGNOSIS — N453 Epididymo-orchitis: Secondary | ICD-10-CM | POA: Insufficient documentation

## 2013-06-21 DIAGNOSIS — F17200 Nicotine dependence, unspecified, uncomplicated: Secondary | ICD-10-CM | POA: Insufficient documentation

## 2013-06-21 DIAGNOSIS — D849 Immunodeficiency, unspecified: Secondary | ICD-10-CM | POA: Insufficient documentation

## 2013-06-21 DIAGNOSIS — Z21 Asymptomatic human immunodeficiency virus [HIV] infection status: Secondary | ICD-10-CM | POA: Insufficient documentation

## 2013-06-21 LAB — CBC WITH DIFFERENTIAL/PLATELET
Basophils Absolute: 0 10*3/uL (ref 0.0–0.1)
Basophils Relative: 0 % (ref 0–1)
Eosinophils Absolute: 0.1 10*3/uL (ref 0.0–0.7)
Eosinophils Relative: 2 % (ref 0–5)
HCT: 38 % — ABNORMAL LOW (ref 39.0–52.0)
Hemoglobin: 13.4 g/dL (ref 13.0–17.0)
Lymphocytes Relative: 40 % (ref 12–46)
Lymphs Abs: 1.8 10*3/uL (ref 0.7–4.0)
MCH: 34.1 pg — ABNORMAL HIGH (ref 26.0–34.0)
MCHC: 35.3 g/dL (ref 30.0–36.0)
MCV: 96.7 fL (ref 78.0–100.0)
Monocytes Absolute: 0.6 10*3/uL (ref 0.1–1.0)
Monocytes Relative: 12 % (ref 3–12)
Neutro Abs: 2.1 10*3/uL (ref 1.7–7.7)
Neutrophils Relative %: 46 % (ref 43–77)
Platelets: 376 10*3/uL (ref 150–400)
RBC: 3.93 MIL/uL — ABNORMAL LOW (ref 4.22–5.81)
RDW: 14.6 % (ref 11.5–15.5)
WBC: 4.5 10*3/uL (ref 4.0–10.5)

## 2013-06-21 LAB — BASIC METABOLIC PANEL WITH GFR
BUN: 10 mg/dL (ref 6–23)
CO2: 26 meq/L (ref 19–32)
Calcium: 9.6 mg/dL (ref 8.4–10.5)
Chloride: 100 meq/L (ref 96–112)
Creatinine, Ser: 0.67 mg/dL (ref 0.50–1.35)
GFR calc Af Amer: >90 mL/min
GFR calc non Af Amer: >90 mL/min
Glucose, Bld: 101 mg/dL — ABNORMAL HIGH (ref 70–99)
Potassium: 4.1 meq/L (ref 3.5–5.1)
Sodium: 134 meq/L — ABNORMAL LOW (ref 135–145)

## 2013-06-21 LAB — URINALYSIS, ROUTINE W REFLEX MICROSCOPIC
Bilirubin Urine: NEGATIVE
Glucose, UA: NEGATIVE mg/dL
Ketones, ur: NEGATIVE mg/dL
Nitrite: POSITIVE — AB
Protein, ur: NEGATIVE mg/dL
Specific Gravity, Urine: 1.017 (ref 1.005–1.030)
Urobilinogen, UA: 0.2 mg/dL (ref 0.0–1.0)
pH: 6 (ref 5.0–8.0)

## 2013-06-21 LAB — URINE MICROSCOPIC-ADD ON

## 2013-06-21 MED ORDER — ONDANSETRON HCL 4 MG/2ML IJ SOLN
4.0000 mg | Freq: Once | INTRAMUSCULAR | Status: AC
  Administered 2013-06-21: 4 mg via INTRAVENOUS
  Filled 2013-06-21: qty 2

## 2013-06-21 MED ORDER — MORPHINE SULFATE 4 MG/ML IJ SOLN
4.0000 mg | Freq: Once | INTRAMUSCULAR | Status: AC
  Administered 2013-06-21: 4 mg via INTRAVENOUS
  Filled 2013-06-21: qty 1

## 2013-06-21 MED ORDER — DEXTROSE 5 % IV SOLN
1.0000 g | Freq: Once | INTRAVENOUS | Status: AC
  Administered 2013-06-21: 1 g via INTRAVENOUS
  Filled 2013-06-21: qty 10

## 2013-06-21 MED ORDER — OXYCODONE-ACETAMINOPHEN 10-325 MG PO TABS: 0.5000 | ORAL_TABLET | ORAL | Status: DC | PRN

## 2013-06-21 MED ORDER — SODIUM CHLORIDE 0.9 % IV SOLN
Freq: Once | INTRAVENOUS | Status: AC
  Administered 2013-06-21: 15:00:00 via INTRAVENOUS

## 2013-06-21 MED ORDER — LEVOFLOXACIN 500 MG PO TABS: 500.0000 mg | ORAL_TABLET | Freq: Every day | ORAL | Status: DC

## 2013-06-21 NOTE — ED Provider Notes (Signed)
Call received from pharmacy regarding prescription for oxycodone acetaminophen 10-325. On August 20, he felt a prescription for 120 tablets of oxycodone-acetaminophen 5-325 but had complained that it is not effective. Says he should have adequate number of visits of oxycodone acetaminophen tablets left, it is decided to not fill the prescription for oxycodone-acetaminophen 10-325 and patient is advised to take 2 of his oxycodone-acetaminophen 5-325 tablets as needed for pain.  Dione Booze, MD 06/21/13 [phone]

## 2013-06-21 NOTE — ED Notes (Signed)
Pt c/o left testicle pain x 2 weeks; pt unsure if swelling or red

## 2013-06-21 NOTE — ED Provider Notes (Signed)
CSN: [id number]     Arrival date & time 06/21/13  1145 History   First MD Initiated Contact with Patient 06/21/13 1203     Chief Complaint  Patient presents with  . Testicle Pain   (Consider location/radiation/quality/duration/timing/severity/associated sxs/prior Treatment) HPI Comments: Omar Sandoval This 57 year old HIV positive male who presents with chief complaint of left testicle pain.  Patient states that for the past 2 weeks he is having increasingly worsening pain in the left testicle.  He said the pain is worse when standing.  It is pain is aggravated also with ejaculation, urination.  He has some complaint of pain into the inguinal canal.  The patient also has a history of undescended right testicle with orchiopexy at the age of 64.  The patient is in a monogamous relationship with one partner for many years.  He denies any discharge from his penis.  He did notice a little bit of blood in his ejaculate.  He denies any rectal pain, discharge.  The patient states that he is a triple a prescribing his last CD4 count and viral load were "good." The patient denies any abdominal pain, flank pain, hematuria, fevers, chills, fatigue, unexplained weight loss or soaking night sweats.  Patient is a 57 y.o. male presenting with testicular pain. The history is provided by the patient and medical records. No language interpreter was used.  Testicle Pain This is a new problem. The current episode started 1 to 4 weeks ago. The problem occurs constantly. The problem has been gradually worsening. Associated symptoms include abdominal pain and urinary symptoms. Pertinent negatives include no anorexia, arthralgias, change in bowel habit, chest pain, chills, congestion, coughing, diaphoresis, fatigue, fever, headaches, joint swelling, myalgias, nausea, neck pain, numbness, rash, sore throat, swollen glands, vertigo, visual change, vomiting or weakness. Exacerbated by: standing, ejaculating, urinating. He has  tried nothing for the symptoms.    Past Medical History  Diagnosis Date  . Immune deficiency disorder   . HIV (human immunodeficiency virus infection)    Past Surgical History  Procedure Laterality Date  . Fracture surgery      left ankle and right foot  . Colonoscopy    . Hernia repair    . Surgery scrotal / testicular      nondecended testes   Family History  Problem Relation Age of Onset  . Cancer Mother     lung  . Cancer Brother     lung, liver, and colon   History  Substance Use Topics  . Smoking status: Current Some Day Smoker -- 0.50 packs/day    Types: Cigarettes  . Smokeless tobacco: Not on file  . Alcohol Use: 1.8 oz/week    3 Cans of beer per week     Comment: beer daily    Review of Systems  Constitutional: Negative for fever, chills, diaphoresis, activity change, appetite change, fatigue and unexpected weight change.  HENT: Negative for congestion and sore throat.   Respiratory: Negative for cough.   Cardiovascular: Negative for chest pain.  Gastrointestinal: Positive for abdominal pain. Negative for nausea, vomiting, anorexia and change in bowel habit.  Genitourinary: Positive for dysuria and testicular pain. Negative for frequency, hematuria, flank pain, discharge, difficulty urinating and penile pain.  Musculoskeletal: Negative for arthralgias, joint swelling, myalgias and neck pain.  Skin: Negative for rash.  Neurological: Negative for vertigo, weakness, numbness and headaches.  Psychiatric/Behavioral: Negative for behavioral problems.  All other systems reviewed and are negative.    Allergies  Vicodin  Home  Medications   Current Outpatient Rx  Name  Route  Sig  Dispense  Refill  . efavirenz-emtrictabine-tenofovir (ATRIPLA) 600-200-300 MG per tablet   Oral   Take 1 tablet by mouth at bedtime.         [name] oxyCODONE-acetaminophen (PERCOCET) 10-325 MG per tablet   Oral   Take 1 tablet by mouth every 8 (eight) hours as needed. For pain           BP 135/88  Pulse 93  Temp(Src) 97.8 F (36.6 C) (Oral)  Resp 16  Ht 5\' 9"  (1.753 m)  Wt 211 lb 8 oz (95.936 kg)  BMI 31.22 kg/m2  SpO2 95% Physical Exam  Nursing note and vitals reviewed. Constitutional: He appears well-developed and well-nourished. No distress.  HENT:  Head: Normocephalic and atraumatic.  Eyes: Conjunctivae are normal. No scleral icterus.  Neck: Normal range of motion. Neck supple.  Cardiovascular: Normal rate, regular rhythm and normal heart sounds.   Pulmonary/Chest: Effort normal and breath sounds normal. No respiratory distress.  Abdominal: Soft. There is no tenderness.  Genitourinary:  Genital exam: Normal male anatomy with a circumcised penis.  No discharge from meatus.  Right testicle normal in size, nontender, epididymis nontender, no spermatic cord tenderness and no right sided hernia.  Left testicle is swollen and tender to touch, no epididymal tenderness, no spermatic cord tenderness, negative pre-and signed, patient has tenderness in the inguinal canal with palpation.  No palpable hernia.  Musculoskeletal: He exhibits no edema.  Neurological: He is alert.  Skin: Skin is warm and dry. He is not diaphoretic.  Psychiatric: His behavior is normal.    ED Course  Procedures (including critical care time) Labs Review Labs Reviewed  GC/CHLAMYDIA PROBE AMP  URINALYSIS, ROUTINE W REFLEX MICROSCOPIC  CBC WITH DIFFERENTIAL  BASIC METABOLIC PANEL   Imaging Review No results found.  EKG Interpretation   None       MDM   1. Epididymitis   2. UTI (lower urinary tract infection)    Patient here with complaint of left testicle pain.  Ongoing for the past 2 weeks.  Has been gradually worsening.  Patient will have testicular ultrasound to rule out possible torsion however it is lower on the differential.  The patient may have possibility of intermittent torsion.  May have possible hernia.  I do not suspect referred pain from kidney stone.   Differential includes epididymitis, orchitis. Less likely is torsion. [place] pending.    1:58 PM BP 134/84  Pulse 84  Temp(Src) 97.7 F (36.5 C) (Oral)  Resp 18  Ht 5\' 9"  (1.753 m)  Wt 211 lb 8 oz (95.936 kg)  BMI 31.22 kg/m2  SpO2 98%  Urine appears infected, culture pending.. I have ordered IV rocephin. G/C CHLAMYDIA probe collected and results are pending. The patient pain controlled here in the ED. [place] results consistent with epididymitis. Patient will be discharged with 10 days of doxycycline. F/u with urology. The patient appears reasonably screened and/or stabilized for discharge and I doubt any other medical condition or other [facility] requiring further screening, evaluation, or treatment in the ED at this time prior to discharge.    Arthor Captain, PA-C 06/24/13 1415

## 2013-06-21 NOTE — ED Notes (Signed)
Pt discharged.Vital signs stable and GCS 15.Discharge instruction given. 

## 2013-06-22 LAB — GC/CHLAMYDIA PROBE AMP: CT Probe RNA: NEGATIVE

## 2013-06-23 LAB — URINE CULTURE: Colony Count: >=100000

## 2013-06-25 NOTE — ED Provider Notes (Signed)
Medical screening examination/treatment/procedure(s) were performed by non-physician practitioner and as supervising physician I was immediately available for consultation/collaboration.  EKG Interpretation   None         Audree Camel, MD 06/25/13 1735

## 2013-12-11 ENCOUNTER — Emergency Department (HOSPITAL_COMMUNITY)
Admission: EM | Admit: 2013-12-11 | Discharge: 2013-12-11 | Disposition: A | Discharge status: Home / Self Care | Payer: Medicare Other | Attending: Emergency Medicine | Admitting: Emergency Medicine

## 2013-12-11 ENCOUNTER — Encounter (HOSPITAL_COMMUNITY): Payer: Self-pay | Admitting: Emergency Medicine

## 2013-12-11 DIAGNOSIS — Z21 Asymptomatic human immunodeficiency virus [HIV] infection status: Secondary | ICD-10-CM | POA: Insufficient documentation

## 2013-12-11 DIAGNOSIS — Z8639 Personal history of other endocrine, nutritional and metabolic disease: Secondary | ICD-10-CM | POA: Insufficient documentation

## 2013-12-11 DIAGNOSIS — N390 Urinary tract infection, site not specified: Secondary | ICD-10-CM

## 2013-12-11 DIAGNOSIS — F17200 Nicotine dependence, unspecified, uncomplicated: Secondary | ICD-10-CM | POA: Insufficient documentation

## 2013-12-11 DIAGNOSIS — Z862 Personal history of diseases of the blood and blood-forming organs and certain disorders involving the immune mechanism: Secondary | ICD-10-CM | POA: Insufficient documentation

## 2013-12-11 DIAGNOSIS — R5381 Other malaise: Secondary | ICD-10-CM | POA: Insufficient documentation

## 2013-12-11 DIAGNOSIS — R5383 Other fatigue: Secondary | ICD-10-CM

## 2013-12-11 DIAGNOSIS — L702 Acne varioliformis: Secondary | ICD-10-CM | POA: Insufficient documentation

## 2013-12-11 LAB — CBC WITH DIFFERENTIAL/PLATELET
BASOS ABS: 0 10*3/uL (ref 0.0–0.1)
Basophils Relative: 0 % (ref 0–1)
Eosinophils Absolute: 0 10*3/uL (ref 0.0–0.7)
Eosinophils Relative: 0 % (ref 0–5)
HEMATOCRIT: 39.3 % (ref 39.0–52.0)
HEMOGLOBIN: 13.4 g/dL (ref 13.0–17.0)
LYMPHS ABS: 1.8 10*3/uL (ref 0.7–4.0)
LYMPHS PCT: 15 % (ref 12–46)
MCH: 33.3 pg (ref 26.0–34.0)
MCHC: 34.1 g/dL (ref 30.0–36.0)
MCV: 97.5 fL (ref 78.0–100.0)
MONO ABS: 2 10*3/uL — AB (ref 0.1–1.0)
MONOS PCT: 16 % — AB (ref 3–12)
NEUTROS ABS: 8.6 10*3/uL — AB (ref 1.7–7.7)
Neutrophils Relative %: 69 % (ref 43–77)
Platelets: 504 10*3/uL — ABNORMAL HIGH (ref 150–400)
RBC: 4.03 MIL/uL — AB (ref 4.22–5.81)
RDW: 13.6 % (ref 11.5–15.5)
WBC: 12.4 10*3/uL — AB (ref 4.0–10.5)

## 2013-12-11 LAB — URINALYSIS, ROUTINE W REFLEX MICROSCOPIC
BILIRUBIN URINE: NEGATIVE
Glucose, UA: NEGATIVE mg/dL
Ketones, ur: 15 mg/dL — AB
Nitrite: POSITIVE — AB
PROTEIN: 30 mg/dL — AB
Specific Gravity, Urine: 1.021 (ref 1.005–1.030)
UROBILINOGEN UA: 0.2 mg/dL (ref 0.0–1.0)
pH: 5.5 (ref 5.0–8.0)

## 2013-12-11 LAB — COMPREHENSIVE METABOLIC PANEL
ALT: 42 U/L (ref 0–53)
AST: 44 U/L — AB (ref 0–37)
Albumin: 3 g/dL — ABNORMAL LOW (ref 3.5–5.2)
Alkaline Phosphatase: 120 U/L — ABNORMAL HIGH (ref 39–117)
BILIRUBIN TOTAL: 0.2 mg/dL — AB (ref 0.3–1.2)
BUN: 9 mg/dL (ref 6–23)
CHLORIDE: 93 meq/L — AB (ref 96–112)
CO2: 24 meq/L (ref 19–32)
CREATININE: 0.89 mg/dL (ref 0.50–1.35)
Calcium: 9.6 mg/dL (ref 8.4–10.5)
GLUCOSE: 125 mg/dL — AB (ref 70–99)
Potassium: 3.9 mEq/L (ref 3.7–5.3)
Sodium: 132 mEq/L — ABNORMAL LOW (ref 137–147)
Total Protein: 8 g/dL (ref 6.0–8.3)

## 2013-12-11 LAB — URINE MICROSCOPIC-ADD ON

## 2013-12-11 MED ORDER — ACETAMINOPHEN 325 MG PO TABS
650.0000 mg | ORAL_TABLET | Freq: Once | ORAL | Status: AC
  Administered 2013-12-11: 650 mg via ORAL
  Filled 2013-12-11: qty 2

## 2013-12-11 MED ORDER — SODIUM CHLORIDE 0.9 % IV BOLUS (SEPSIS)
500.0000 mL | Freq: Once | INTRAVENOUS | Status: AC
  Administered 2013-12-11: 500 mL via INTRAVENOUS

## 2013-12-11 MED ORDER — DEXTROSE 5 % IV SOLN
1.0000 g | Freq: Once | INTRAVENOUS | Status: AC
  Administered 2013-12-11: 1 g via INTRAVENOUS
  Filled 2013-12-11: qty 10

## 2013-12-11 MED ORDER — CIPROFLOXACIN HCL 500 MG PO TABS: 500.0000 mg | ORAL_TABLET | Freq: Two times a day (BID) | ORAL | Status: DC

## 2013-12-11 NOTE — Discharge Instructions (Signed)
Please read and follow all provided instructions.  Your diagnoses today include:  1. Urinary tract infection     Tests performed today include:  Urine test - suggests that you have an infection in your bladder  Blood counts and electrolytes  Test for gonorrhea and chlamydia -- you will be notified with a positive result  Vital signs. See below for your results today.   Medications prescribed:   Ciprofloxacin - antibiotic  You have been prescribed an antibiotic medicine: take the entire course of medicine even if you are feeling better. Stopping early can cause the antibiotic not to work.  Home care instructions:  Follow any educational materials contained in this packet.  Follow-up instructions: Please follow-up with your primary care provider in 3 days for a recheck of your urine.   If you do not have a primary care doctor -- see below for referral information.   Return instructions:   Please return to the Emergency Department if you experience worsening symptoms.   Return with fever, worsening pain, persistent vomiting, worsening pain in your back.   Please return if you have any other emergent concerns.  Additional Information:  Your vital signs today were: BP 127/81   Pulse 82   Temp(Src) 99.4 F (37.4 C)   Resp 18   SpO2 91% If your blood pressure (BP) was elevated above 135/85 this visit, please have this repeated by your doctor within one month. --------------

## 2013-12-11 NOTE — ED Notes (Addendum)
Per pt sts that he has been having flu like symptoms, diarrhea for a few weeks. sts it will get better and then come back. sts he just doesn't feel well. sts he also wants a STD check. Denies discharge.

## 2013-12-11 NOTE — ED Provider Notes (Signed)
CSN: [id number]     Arrival date & time 12/11/13  1106 History   First MD Initiated Contact with Patient 12/11/13 1206     Chief Complaint  Patient presents with  . Diarrhea  . Chills     (Consider location/radiation/quality/duration/timing/severity/associated sxs/prior Treatment) HPI Comments: Patient with history of HIV, CD4 count 760 in 09/2013 -- presents with complaint of 2 weeks of malaise, generalized fatigue, subjective fever and chills. Patient has not had nausea, vomiting, or diarrhea. He has not had chest pain, cough, abdominal pain. He has not had skin rash. Patient states that he has had increasing urinary frequency but no current dysuria or hematuria. Patient has had similar symptoms in the past with having urinary tract infection. He has been eating and drinking well and is compliant with his antiretroviral therapy. Onset of symptoms gradual. Course is constant. Nothing makes symptoms better worse. No treatments prior to arrival. Patient is in a same sex relationship and participates in receptive sex only.  The history is provided by the patient and medical records.    Past Medical History  Diagnosis Date  . Immune deficiency disorder   . HIV (human immunodeficiency virus infection)    Past Surgical History  Procedure Laterality Date  . Fracture surgery      left ankle and right foot  . Colonoscopy    . Hernia repair    . Surgery scrotal / testicular      nondecended testes   Family History  Problem Relation Age of Onset  . Cancer Mother     lung  . Cancer Brother     lung, liver, and colon   History  Substance Use Topics  . Smoking status: Current Some Day Smoker -- 0.50 packs/day    Types: Cigarettes  . Smokeless tobacco: Not on file  . Alcohol Use: 1.8 oz/week    3 Cans of beer per week     Comment: beer daily    Review of Systems  Constitutional: Positive for fever and fatigue.  HENT: Negative for rhinorrhea and sore throat.   Eyes: Negative for  redness.  Respiratory: Negative for cough.   Cardiovascular: Negative for chest pain.  Gastrointestinal: Negative for nausea, vomiting, abdominal pain and diarrhea.  Genitourinary: Positive for frequency. Negative for dysuria, hematuria, flank pain, discharge and penile pain.  Musculoskeletal: Negative for myalgias.  Skin: Negative for rash.  Neurological: Negative for headaches.      Allergies  Vicodin  Home Medications   Prior to Admission medications   Medication Sig Start Date End Date Taking? Authorizing Provider  efavirenz-emtrictabine-tenofovir (ATRIPLA) 600-200-300 MG per tablet Take 1 tablet by mouth at bedtime.   Yes Historical Provider, MD  oxyCODONE-acetaminophen (PERCOCET) 10-325 MG per tablet Take 0.5-1 tablets by mouth every 4 (four) hours as needed for pain. 06/21/13  Yes Abigail Harris, PA-C   BP 108/75  Pulse 93  Temp(Src) 99.4 F (37.4 C)  Resp 18  SpO2 93% Physical Exam  Nursing note and vitals reviewed. Constitutional: He appears well-developed and well-nourished.  HENT:  Head: Normocephalic and atraumatic.  Mouth/Throat: Oropharynx is clear and moist. No oropharyngeal exudate.  Eyes: Conjunctivae are normal. Pupils are equal, round, and reactive to light. Right eye exhibits no discharge. Left eye exhibits no discharge.  Neck: Normal range of motion. Neck supple.  Cardiovascular: Normal rate, regular rhythm and normal heart sounds.   Pulmonary/Chest: Effort normal and breath sounds normal. No respiratory distress. He has no wheezes. He has no rales.  Abdominal: Soft. Bowel sounds are normal. There is no tenderness. There is no rebound and no guarding.  Genitourinary: Penis normal. No discharge found.  Lymphadenopathy:    He has no cervical adenopathy.  Neurological: He is alert.  Skin: Skin is warm and dry.  Psychiatric: He has a normal mood and affect.    ED Course  Procedures (including critical care time) Labs Review Labs Reviewed  CBC WITH  DIFFERENTIAL - Abnormal; Notable for the following:    WBC 12.4 (*)    RBC 4.03 (*)    Platelets 504 (*)    Neutro Abs 8.6 (*)    Monocytes Relative 16 (*)    Monocytes Absolute 2.0 (*)    All other components within normal limits  COMPREHENSIVE METABOLIC PANEL - Abnormal; Notable for the following:    Sodium 132 (*)    Chloride 93 (*)    Glucose, Bld 125 (*)    Albumin 3.0 (*)    AST 44 (*)    Alkaline Phosphatase 120 (*)    Total Bilirubin 0.2 (*)    All other components within normal limits  URINALYSIS, ROUTINE W REFLEX MICROSCOPIC - Abnormal; Notable for the following:    APPearance CLOUDY (*)    Hgb urine dipstick MODERATE (*)    Ketones, ur 15 (*)    Protein, ur 30 (*)    Nitrite POSITIVE (*)    Leukocytes, UA MODERATE (*)    All other components within normal limits  URINE MICROSCOPIC-ADD ON - Abnormal; Notable for the following:    Bacteria, UA MANY (*)    All other components within normal limits  URINE CULTURE  GC/CHLAMYDIA PROBE AMP    Imaging Review No results found.   EKG Interpretation None      12:55 PM Patient seen and examined. Work-up initiated. Medications ordered.   Vital signs reviewed and are as follows: Filed Vitals:   12/11/13 1240  BP: 108/75  Pulse: 93  Temp:   Resp:   BP 108/75  Pulse 93  Temp(Src) 99.4 F (37.4 C)  Resp 18  SpO2 93%  Patient informed of results. Will treat for urinary tract infection. Will give dose of Rocephin in emergency department. Patient appears well, nontoxic. Verbalizes understanding and agrees with the plan.  2:32 PM antibiotics completed. Patient to followup with primary care physician in the next 3 days for recheck of urine. Patient encouraged to return to the emergency department with worsening symptoms, fever, vomiting, back pain, or new symptoms. Patient verbalizes understanding and agrees with this plan. Low grade fever at discharge.  BP 114/82  Pulse 91  Temp(Src) 100.5 F (38.1 C) (Oral)   Resp 16  SpO2 97%    MDM   Final diagnoses:  Urinary tract infection   Patient with history of HIV, normal CD4 count, with vague generalized weakness and malaise. No worsening cough or shortness of breath to suggest pneumonia. No abdominal pain to suggest colitis or abdominal infection. No signs of skin infection on exam. No URI symptoms on exam  He does have increased urinary frequency similar to previous UTI. His urine demonstrates nitrite positive urinary tract infection. Doubt pyelo given no back pain, vomiting. Patient was treated in emergency department with Rocephin. Will be discharged to home on Cipro. He is able to tolerate oral fluids. He appears well, nontoxic.  Low grade fever at discharge. I do not feel this will preclude discharge at this time as patient is being appropriately treated, is immunocompetent from  a CD4 standpoint, is well-appearing, has good PCP f/u, and seems reliable to return if worsening. Do not feel he warrants admission at this time. Doubt pyelo.     Renne CriglerJoshua Dacoda Spallone, PA-C 12/11/13 [id number]

## 2013-12-12 NOTE — ED Provider Notes (Signed)
Medical screening examination/treatment/procedure(s) were performed by non-physician practitioner and as supervising physician I was immediately available for consultation/collaboration.   EKG Interpretation None       Humberto Addo R. Shaquala Broeker, MD 12/12/13 1053 

## 2013-12-13 LAB — URINE CULTURE

## 2013-12-13 LAB — GC/CHLAMYDIA PROBE AMP
CT Probe RNA: NEGATIVE
GC Probe RNA: NEGATIVE

## 2013-12-15 ENCOUNTER — Telehealth (HOSPITAL_BASED_OUTPATIENT_CLINIC_OR_DEPARTMENT_OTHER): Payer: Self-pay | Admitting: Emergency Medicine

## 2013-12-15 NOTE — Telephone Encounter (Signed)
Post ED Visit - Positive Culture Follow-up  Culture report reviewed by antimicrobial stewardship pharmacist: []  Wes Dulaney, Pharm.D., BCPS []  Celedonio MiyamotoJeremy Frens, [address] BCPS []  Georgina PillionElizabeth Martin, Pharm.D., BCPS []  KeshenaMinh Pham, [address] BCPS, AAHIVP []  Estella HuskMichelle Turner, Pharm.D., BCPS, AAHIVP [x]  Harvie JuniorNathan Cope, Pharm.D.  Positive urine culture Treated with Cipro, organism sensitive to the same and no further patient follow-up is required at this time.  Mickie Kozikowski 12/15/2013, 11:25 AM

## 2014-01-03 ENCOUNTER — Ambulatory Visit
Admission: RE | Admit: 2014-01-03 | Discharge: 2014-01-03 | Disposition: A | Discharge status: Home / Self Care | Payer: Medicare Other | Source: Ambulatory Visit | Attending: Otolaryngology | Admitting: Otolaryngology

## 2014-01-03 ENCOUNTER — Other Ambulatory Visit: Payer: Self-pay | Admitting: Otolaryngology

## 2014-01-03 DIAGNOSIS — R059 Cough, unspecified: Secondary | ICD-10-CM

## 2014-01-03 DIAGNOSIS — R05 Cough: Secondary | ICD-10-CM

## 2014-08-12 ENCOUNTER — Emergency Department (HOSPITAL_COMMUNITY)
Admission: EM | Admit: 2014-08-12 | Discharge: 2014-08-12 | Disposition: A | Discharge status: Home / Self Care | Payer: Medicare Other | Attending: Emergency Medicine | Admitting: Emergency Medicine

## 2014-08-12 ENCOUNTER — Encounter (HOSPITAL_COMMUNITY): Payer: Self-pay | Admitting: *Deleted

## 2014-08-12 DIAGNOSIS — R609 Edema, unspecified: Secondary | ICD-10-CM | POA: Diagnosis not present

## 2014-08-12 DIAGNOSIS — Z792 Long term (current) use of antibiotics: Secondary | ICD-10-CM | POA: Diagnosis not present

## 2014-08-12 DIAGNOSIS — Z720 Tobacco use: Secondary | ICD-10-CM | POA: Insufficient documentation

## 2014-08-12 DIAGNOSIS — Z21 Asymptomatic human immunodeficiency virus [HIV] infection status: Secondary | ICD-10-CM | POA: Insufficient documentation

## 2014-08-12 DIAGNOSIS — M7989 Other specified soft tissue disorders: Secondary | ICD-10-CM | POA: Diagnosis present

## 2014-08-12 DIAGNOSIS — Z862 Personal history of diseases of the blood and blood-forming organs and certain disorders involving the immune mechanism: Secondary | ICD-10-CM | POA: Insufficient documentation

## 2014-08-12 LAB — I-STAT CHEM 8, ED
BUN: 10 mg/dL (ref 6–23)
Calcium, Ion: 1.17 mmol/L (ref 1.12–1.23)
Chloride: 101 mEq/L (ref 96–112)
Creatinine, Ser: 0.9 mg/dL (ref 0.50–1.35)
Glucose, Bld: 116 mg/dL — ABNORMAL HIGH (ref 70–99)
HCT: 42 % (ref 39.0–52.0)
Hemoglobin: 14.3 g/dL (ref 13.0–17.0)
Potassium: 3.8 mEq/L (ref 3.7–5.3)
Sodium: 139 mEq/L (ref 137–147)
TCO2: 25 mmol/L (ref 0–100)

## 2014-08-12 NOTE — Progress Notes (Signed)
VASCULAR LAB PRELIMINARY  PRELIMINARY  PRELIMINARY  PRELIMINARY  Right lower extremity venous duplex completed.    Preliminary report:  Right:  No evidence of DVT, superficial thrombosis, or Baker's cyst.  Aleks Nawrot, RVT 08/12/2014, 3:15 PM

## 2014-08-12 NOTE — Discharge Instructions (Signed)
Your ultrasound did not show any signs of blood clot.  Return here as needed.  Follow-up with your primary care doctor, elevate your lower extremities above the level of your heart

## 2014-08-12 NOTE — ED Notes (Signed)
Pt remains monitored by blood pressure, pulse ox, and 5 lead. Phlebotomy at bedside.  

## 2014-08-12 NOTE — ED Notes (Signed)
Pt reports onset of right leg swelling yesterday. Denies injury or pain to leg. No acute distress noted at triage.

## 2014-08-12 NOTE — ED Provider Notes (Signed)
CSN: [id number]     Arrival date & time 08/12/14  1257 History   First MD Initiated Contact with Patient 08/12/14 1335     Chief Complaint  Patient presents with  . Leg Swelling     (Consider location/radiation/quality/duration/timing/severity/associated sxs/prior Treatment) HPI Patient presents to the emergency department with right leg swelling that started 2 days ago.  The patient states that he does not have any pain or injury to that leg.  The patient states that he is concerned about blood clot.  The patient states that nothing seems make his condition, better or worse.  Patient denies fever, nausea, vomiting, chest pain, shortness of breath, abdominal pain, weakness, dizziness, back pain, numbness, lightheadedness, hemoptysis, bloody stool or syncope.  The patient states that he did not take any medications prior to arrival.  Past Medical History  Diagnosis Date  . Immune deficiency disorder   . HIV (human immunodeficiency virus infection)    Past Surgical History  Procedure Laterality Date  . Fracture surgery      left ankle and right foot  . Colonoscopy    . Hernia repair    . Surgery scrotal / testicular      nondecended testes   Family History  Problem Relation Age of Onset  . Cancer Mother     lung  . Cancer Brother     lung, liver, and colon   History  Substance Use Topics  . Smoking status: Current Some Day Smoker -- 0.50 packs/day    Types: Cigarettes  . Smokeless tobacco: Not on file  . Alcohol Use: 1.8 oz/week    3 Cans of beer per week     Comment: beer daily    Review of Systems`  All other systems negative except as documented in the HPI. All pertinent positives and negatives as reviewed in the HPI.   Allergies  Vicodin  Home Medications   Prior to Admission medications   Medication Sig Start Date End Date Taking? Authorizing Provider  Abacavir-Dolutegravir-Lamivud (TRIUMEQ) 600-50-300 MG TABS Take 1 tablet by mouth at bedtime.  03/03/14  Yes  Historical Provider, MD  Cyanocobalamin (VITAMIN B-12 IJ) Inject 1 Units as directed every 30 (thirty) days.   Yes Historical Provider, MD  oxyCODONE-acetaminophen (PERCOCET) 10-325 MG per tablet Take 0.5-1 tablets by mouth every 4 (four) hours as needed for pain. Patient taking differently: Take 1 tablet by mouth every 4 (four) hours as needed for pain.  06/21/13  Yes Arthor CaptainAbigail Harris, PA-C  PRESCRIPTION MEDICATION Inject 1 Units as directed See admin instructions. Steroid injections every 2 to 3 months, get shots from doctor's office.   Yes Historical Provider, MD  ciprofloxacin (CIPRO) 500 MG tablet Take 1 tablet (500 mg total) by mouth 2 (two) times daily. 12/11/13   Renne CriglerJoshua Geiple, PA-C   BP 116/69 mmHg  Pulse 69  Temp(Src) 98 F (36.7 C) (Oral)  Resp 21  SpO2 100% Physical Exam  Constitutional: He is oriented to person, place, and time. He appears well-developed and well-nourished. No distress.  HENT:  Head: Normocephalic and atraumatic.  Mouth/Throat: Oropharynx is clear and moist.  Eyes: Pupils are equal, round, and reactive to light.  Neck: Normal range of motion. Neck supple. No JVD present.  Cardiovascular: Normal rate, regular rhythm and normal heart sounds.  Exam reveals no gallop and no friction rub.   No murmur heard. Pulmonary/Chest: Effort normal and breath sounds normal. No respiratory distress.  Abdominal: Soft. Bowel sounds are normal. He exhibits no distension.  There is no tenderness.  Musculoskeletal: He exhibits edema.  Patient has bilateral pitting edema, right greater than left  Neurological: He is alert and oriented to person, place, and time. He exhibits normal muscle tone. Coordination normal.  Skin: Skin is warm and dry. No rash noted. No erythema.  Psychiatric: He has a normal mood and affect. His behavior is normal.  Nursing note and vitals reviewed.   ED Course  Procedures (including critical care time) Labs Review Labs Reviewed  I-STAT CHEM 8, ED -  Abnormal; Notable for the following:    Glucose, Bld 116 (*)    All other components within normal limits    Patient has a negative venous Doppler of the right lower extremity.  He has bilateral peripheral edema.  1+ pitting.  The patient is advised follow-up with his primary care Dr. told to elevate his legs with level of his heart.  Patient does not have any signs of or symptoms of CHF  MDM   Final diagnoses:  None        Carlyle DollyChristopher W Kabeer Hoagland, PA-C 08/16/14 0015  Juliet RudeNathan R. Rubin PayorPickering, MD 08/16/14 [phone]

## 2015-08-26 ENCOUNTER — Emergency Department (HOSPITAL_COMMUNITY)
Admission: EM | Admit: 2015-08-26 | Discharge: 2015-08-26 | Disposition: A | Discharge status: Home / Self Care | Payer: Medicare HMO | Attending: Emergency Medicine | Admitting: Emergency Medicine

## 2015-08-26 ENCOUNTER — Emergency Department (HOSPITAL_COMMUNITY): Payer: Medicare HMO

## 2015-08-26 ENCOUNTER — Encounter (HOSPITAL_COMMUNITY): Payer: Self-pay | Admitting: Emergency Medicine

## 2015-08-26 DIAGNOSIS — M549 Dorsalgia, unspecified: Secondary | ICD-10-CM

## 2015-08-26 DIAGNOSIS — Z21 Asymptomatic human immunodeficiency virus [HIV] infection status: Secondary | ICD-10-CM | POA: Diagnosis not present

## 2015-08-26 DIAGNOSIS — R059 Cough, unspecified: Secondary | ICD-10-CM

## 2015-08-26 DIAGNOSIS — R05 Cough: Secondary | ICD-10-CM | POA: Insufficient documentation

## 2015-08-26 DIAGNOSIS — M545 Low back pain: Secondary | ICD-10-CM | POA: Diagnosis present

## 2015-08-26 DIAGNOSIS — Z792 Long term (current) use of antibiotics: Secondary | ICD-10-CM | POA: Insufficient documentation

## 2015-08-26 DIAGNOSIS — Z87891 Personal history of nicotine dependence: Secondary | ICD-10-CM | POA: Diagnosis not present

## 2015-08-26 MED ORDER — ACETAMINOPHEN 325 MG PO TABS
650.0000 mg | ORAL_TABLET | Freq: Once | ORAL | Status: AC
Start: 2015-08-26 — End: 2015-08-26
  Administered 2015-08-26: 650 mg via ORAL
  Filled 2015-08-26: qty 2

## 2015-08-26 MED ORDER — IBUPROFEN 800 MG PO TABS: 800.0000 mg | ORAL_TABLET | Freq: Three times a day (TID) | ORAL | Status: DC

## 2015-08-26 MED ORDER — ACETAMINOPHEN 325 MG PO TABS: 650.0000 mg | ORAL_TABLET | Freq: Four times a day (QID) | ORAL | Status: DC | PRN

## 2015-08-26 MED ORDER — KETOROLAC TROMETHAMINE 30 MG/ML IJ SOLN
30.0000 mg | Freq: Once | INTRAMUSCULAR | Status: AC
  Administered 2015-08-26: 30 mg via INTRAVENOUS
  Filled 2015-08-26: qty 1

## 2015-08-26 MED ORDER — BENZONATATE 100 MG PO CAPS: 100.0000 mg | ORAL_CAPSULE | Freq: Three times a day (TID) | ORAL | Status: DC

## 2015-08-26 NOTE — ED Notes (Signed)
Patient states low back pain that goes into legs.   Patient states he has "been living in a house with no heat since the 14th of December".  Patient states that he has been having problems with his back since he has been having to live without heat.   Patient states congested cough since that time with nasal congestion.   Patient talking in full sentences with no distress.

## 2015-08-26 NOTE — Discharge Instructions (Signed)
You were seen in the ER today for cough and back pain. Your chest x-ray was normal. Your back pain is likely musculoskeletal pain due to coughing. You may take tessalon, a cough medicine, as needed for cough. I will also give you prescriptions for ibuprofen and tylenol to take as needed for your sore back. Please follow-up with your primary care provider within one week. Return to the ER for new or worsening symptoms.

## 2015-08-26 NOTE — ED Provider Notes (Signed)
CSN: [id number]     Arrival date & time 08/26/15  1232 History   First MD Initiated Contact with Patient 08/26/15 1343     Chief Complaint  Patient presents with  . Back Pain    HPI  [name] is an 59 y.o. male with history of HIV who presents to the ED for evaluation of back pain and cough. States he has not had heat at his house for the past couple of weeks and feels like he has a cold "set in to his back bones." He reports an intermittent cough productive of clear sputum. States his back hurts all over and sometimes it feels like his pain radiates into his legs. Denies chest pain or SOB. Denies fever, chills, abdominal pain, n/v/d. He does not follow with ID. Denies IVDU. Denies saddle paresthesias, bowel bladder incontinence. He states he has not tried anything to help with his symptoms.    Past Medical History  Diagnosis Date  . Immune deficiency disorder (HCC)   . HIV (human immunodeficiency virus infection) [facility])    Past Surgical History  Procedure Laterality Date  . Fracture surgery      left ankle and right foot  . Colonoscopy    . Hernia repair    . Surgery scrotal / testicular      nondecended testes   Family History  Problem Relation Age of Onset  . Cancer Mother     lung  . Cancer Brother     lung, liver, and colon   Social History  Substance Use Topics  . Smoking status: Former Smoker -- 0.00 packs/day    Types: Cigarettes  . Smokeless tobacco: None  . Alcohol Use: 1.8 oz/week    3 Cans of beer per week     Comment: beer daily    Review of Systems  All other systems reviewed and are negative.     Allergies  Vicodin  Home Medications   Prior to Admission medications   Medication Sig Start Date End Date Taking? Authorizing Provider  Abacavir-Dolutegravir-Lamivud (TRIUMEQ) 600-50-300 MG TABS Take 1 tablet by mouth at bedtime.  03/03/14  Yes Historical Provider, MD  oxyCODONE-acetaminophen (PERCOCET) 10-325 MG per tablet Take 0.5-1 tablets by mouth  every 4 (four) hours as needed for pain. Patient taking differently: Take 1 tablet by mouth every 4 (four) hours as needed for pain.  06/21/13  Yes Arthor Captain, PA-C  ciprofloxacin (CIPRO) 500 MG tablet Take 1 tablet (500 mg total) by mouth 2 (two) times daily. 12/11/13   Renne Crigler, PA-C  Cyanocobalamin (VITAMIN B-12 IJ) Inject 1 Units as directed every 30 (thirty) days.    Historical Provider, MD  PRESCRIPTION MEDICATION Inject 1 Units as directed See admin instructions. Steroid injections every 2 to 3 months, get shots from doctor's office.    Historical Provider, MD   BP 129/85 mmHg  Pulse 73  Temp(Src) 98.3 F (36.8 C) (Oral)  Resp 18  Ht  (1.702 m)  Wt 100.381 kg  BMI 34.65 kg/m2  SpO2 99% Physical Exam  Constitutional: He is oriented to person, place, and time.  HENT:  Right Ear: External ear normal.  Left Ear: External ear normal.  Nose: Nose normal.  Mouth/Throat: Oropharynx is clear and moist. No oropharyngeal exudate.  Eyes: Conjunctivae and EOM are normal. Pupils are equal, round, and reactive to light.  Neck: Normal range of motion. Neck supple.  Cardiovascular: Normal rate, regular rhythm, normal heart sounds and intact distal pulses.  Pulmonary/Chest: Effort normal and breath sounds normal. No respiratory distress. He has no wheezes. He exhibits no tenderness.  Abdominal: Soft. Bowel sounds are normal. He exhibits no distension. There is no tenderness.  Musculoskeletal: Normal range of motion. He exhibits no edema.  Diffuse back tenderness. No c-spine, l-spine, or t-spine tenderness.  Neurological: He is alert and oriented to person, place, and time. He has normal strength. No cranial nerve deficit or sensory deficit. He displays a negative Romberg sign. Gait normal.  Skin: Skin is warm and dry.  Psychiatric: He has a normal mood and affect.  Nursing note and vitals reviewed.   ED Course  Procedures (including critical care time) Labs Review Labs  Reviewed - No data to display  Imaging Review Dg Chest 2 View  08/26/2015  CLINICAL DATA:  Dry cough x2 weeks, pt denies SOB and CP. Pt states his landlord has cut off his heat on 08/09/2015. Hx of HIV. Ex-smoker, quit x2 years ago. EXAM: CHEST  2 VIEW COMPARISON:  01/03/2014 FINDINGS: Midline trachea. Normal heart size. Mildly tortuous thoracic aorta. No pleural effusion or pneumothorax. Apical lordotic positioning on the frontal radiograph. Mild hyperinflation. Clear lungs. IMPRESSION: Mild hyperinflation, without acute disease. Electronically Signed   By: Jeronimo GreavesKyle  Talbot M.D.   On: 08/26/2015 14:56   I have personally reviewed and evaluated these images and lab results as part of my medical decision-making.   EKG Interpretation None      MDM   Final diagnoses:  Cough  Bilateral back pain, unspecified location    Diffuse back pain. Suspect musculoskeletal pain. No spinal tenderness. Afebrile. No red flags for cauda equina, epidural abscess, or other acute spinal pathology. CXR obtained given 2 week history of cough. CXR negative. Likely viral vs seasonal/allergic cough. Will give toradol and tylenol. Will give rx for tessalon, tylenol, and ibuprofen.     Carlene CoriaSerena Y Rishika Mccollom, PA-C 08/28/15 1519  Cathren LaineKevin Steinl, MD 08/29/15 1007

## 2015-09-16 ENCOUNTER — Emergency Department (HOSPITAL_COMMUNITY)
Admission: EM | Admit: 2015-09-16 | Discharge: 2015-09-16 | Disposition: A | Discharge status: Home / Self Care | Payer: Medicare HMO | Attending: Emergency Medicine | Admitting: Emergency Medicine

## 2015-09-16 ENCOUNTER — Emergency Department (HOSPITAL_COMMUNITY): Payer: Medicare HMO

## 2015-09-16 ENCOUNTER — Encounter (HOSPITAL_COMMUNITY): Payer: Self-pay | Admitting: *Deleted

## 2015-09-16 DIAGNOSIS — M545 Low back pain: Secondary | ICD-10-CM | POA: Diagnosis present

## 2015-09-16 DIAGNOSIS — Z79899 Other long term (current) drug therapy: Secondary | ICD-10-CM | POA: Insufficient documentation

## 2015-09-16 DIAGNOSIS — M25562 Pain in left knee: Secondary | ICD-10-CM | POA: Insufficient documentation

## 2015-09-16 DIAGNOSIS — Z87891 Personal history of nicotine dependence: Secondary | ICD-10-CM | POA: Insufficient documentation

## 2015-09-16 DIAGNOSIS — M5440 Lumbago with sciatica, unspecified side: Secondary | ICD-10-CM | POA: Diagnosis not present

## 2015-09-16 DIAGNOSIS — B20 Human immunodeficiency virus [HIV] disease: Secondary | ICD-10-CM | POA: Insufficient documentation

## 2015-09-16 DIAGNOSIS — Z792 Long term (current) use of antibiotics: Secondary | ICD-10-CM | POA: Diagnosis not present

## 2015-09-16 MED ORDER — OXYCODONE-ACETAMINOPHEN 5-325 MG PO TABS
1.0000 | ORAL_TABLET | Freq: Once | ORAL | Status: AC
  Administered 2015-09-16: 1 via ORAL
  Filled 2015-09-16: qty 1

## 2015-09-16 MED ORDER — METHOCARBAMOL 500 MG PO TABS: 500.0000 mg | ORAL_TABLET | Freq: Two times a day (BID) | ORAL | Status: DC

## 2015-09-16 NOTE — ED Provider Notes (Signed)
CSN: [id number]     Arrival date & time 09/16/15  4098 History  By signing my name below, I, [name], attest that this documentation has been prepared under the direction and in the presence of Barrett Henle, [place]. Electronically Signed: [name], ED Scribe. 09/16/2015. 10:43 AM.   Chief Complaint  Patient presents with  . Back Pain  . Leg Pain   The history is provided by the patient. No language interpreter was used.    HPI Comments: Omar Sandoval is a 60 y.o. male with past medical history of HIV who presents to the Emergency Department complaining of constant, unchanged aching lower back pain that began 2 weeks ago. He reports associated bilateral hip pain and bilateral thigh pain. He denies recent heavy lifting or injury to the affected area. He has been taking Percocet (prescribed by neurologist) and gets cortisone injections every 2-3 months (next appointment is 09/2015 at [facility]). Patient reports this pain is consistent with his chronic pain but notes it is worse. Pt denies fevers, chills, dysuria, abdominal pain, nausea, vomiting, urinary symptoms, bowel/bladder incontinence or retention, numbness or paraesthesia in groin or extremities. Pt denies any history of spinal manipulations, cancer, or IV drug abuse.   Pt also c/o left knee pain with mild swelling. Pt reports that in November 2016 he fell on his left knee. Pt denies any head injuries, LOC or any other associated sx at that time. Denies fever, redness, swelling, warmth, numbness, tingling, weakness.   Past Medical History  Diagnosis Date  . Immune deficiency disorder (HCC)   . HIV (human immunodeficiency virus infection) [facility])    Past Surgical History  Procedure Laterality Date  . Fracture surgery      left ankle and right foot  . Colonoscopy    . Hernia repair    . Surgery scrotal / testicular      nondecended testes   Family History  Problem Relation Age of Onset  . Cancer Mother     lung  .  Cancer Brother     lung, liver, and colon   Social History  Substance Use Topics  . Smoking status: Former Smoker -- 0.00 packs/day    Types: Cigarettes  . Smokeless tobacco: None  . Alcohol Use: 1.8 oz/week    3 Cans of beer per week     Comment: beer daily    Review of Systems  Constitutional: Negative for fever and chills.  Genitourinary: Negative for dysuria.  Musculoskeletal: Positive for myalgias (bilateral hips and thighs), back pain, joint swelling ( left knee) and arthralgias (left knee, bilateral hips).  Neurological: Negative for weakness and numbness.  All other systems reviewed and are negative.   Allergies  Vicodin  Home Medications   Prior to Admission medications   Medication Sig Start Date End Date Taking? Authorizing Provider  Abacavir-Dolutegravir-Lamivud (TRIUMEQ) 600-50-300 MG TABS Take 1 tablet by mouth at bedtime.  03/03/14   Historical Provider, MD  acetaminophen (TYLENOL) 325 MG tablet Take 2 tablets (650 mg total) by mouth every 6 (six) hours as needed. 08/26/15   Ace Gins Sam, PA-C  benzonatate (TESSALON) 100 MG capsule Take 1 capsule (100 mg total) by mouth every 8 (eight) hours. 08/26/15   Ace Gins Sam, PA-C  ciprofloxacin (CIPRO) 500 MG tablet Take 1 tablet (500 mg total) by mouth 2 (two) times daily. 12/11/13   Renne Crigler, PA-C  Cyanocobalamin (VITAMIN B-12 IJ) Inject 1 Units as directed every 30 (thirty) days.  Historical Provider, MD  ibuprofen (ADVIL,MOTRIN) 800 MG tablet Take 1 tablet (800 mg total) by mouth 3 (three) times daily. 08/26/15   Ace Gins Sam, PA-C  methocarbamol (ROBAXIN) 500 MG tablet Take 1 tablet (500 mg total) by mouth 2 (two) times daily. 09/16/15   Barrett Henle, PA-C  oxyCODONE-acetaminophen (PERCOCET) 10-325 MG per tablet Take 0.5-1 tablets by mouth every 4 (four) hours as needed for pain. Patient taking differently: Take 1 tablet by mouth every 4 (four) hours as needed for pain.  06/21/13   Arthor Captain, PA-C   PRESCRIPTION MEDICATION Inject 1 Units as directed See admin instructions. Steroid injections every 2 to 3 months, get shots from doctor's office.    Historical Provider, MD   BP 136/83 mmHg  Pulse 63  Temp(Src) 98.4 F (36.9 C) (Oral)  Resp 18  SpO2 100% Physical Exam  Constitutional: He is oriented to person, place, and time. He appears well-developed and well-nourished.  HENT:  Head: Normocephalic and atraumatic.  Eyes: Conjunctivae and EOM are normal. Right eye exhibits no discharge. Left eye exhibits no discharge. No scleral icterus.  Neck: Normal range of motion. Neck supple.  Cardiovascular: Normal rate.   Pulmonary/Chest: Effort normal.  Abdominal: Soft. He exhibits no distension and no mass. There is no tenderness. There is no rebound and no guarding.  Musculoskeletal: Normal range of motion. He exhibits no edema.       Left knee: He exhibits swelling. He exhibits normal range of motion, no effusion, no ecchymosis, no deformity, no laceration, no erythema, normal alignment, no LCL laxity, normal patellar mobility and no MCL laxity. No tenderness found.  No midline C, T, or L spine tenderness. Full ROM of neck and back. Full ROM of the BLE. 5/5 strength in BLE, sensation intact, 2+ PT pulse. Able to stand and ambulate without assistance.   Neurological: He is alert and oriented to person, place, and time. He has normal strength and normal reflexes. No sensory deficit. Gait normal.  Skin: Skin is warm and dry.  Psychiatric: He has a normal mood and affect.  Nursing note and vitals reviewed.   ED Course  Procedures  DIAGNOSTIC STUDIES: Oxygen Saturation is 100% on RA, nl by my interpretation.    COORDINATION OF CARE: 10:54 AM-Discussed treatment plan which includes left knee xray with pt at bedside and pt agreed to plan.   Labs Review Labs Reviewed - No data to display  Imaging Review Dg Knee Complete 4 Views Left  09/16/2015  CLINICAL DATA:  Palpable abnormality on the  lateral aspect of the left knee EXAM: LEFT KNEE - COMPLETE 4+ VIEW COMPARISON:  None. FINDINGS: There is no evidence of fracture, dislocation, or joint effusion. There is no evidence of arthropathy or other focal bone abnormality. Soft tissues are unremarkable. IMPRESSION: No acute abnormality noted. Electronically Signed   By: Alcide Clever M.D.   On: 09/16/2015 12:29     Filed Vitals:   09/16/15 1018 09/16/15 1250  BP: 120/74 136/83  Pulse: 73 63  Temp: 98.2 F (36.8 C) 98.4 F (36.9 C)  Resp: 19 18     MDM   Final diagnoses:  Bilateral low back pain, with sciatica presence unspecified  Left knee pain    Shim presents with worsening chronic back pain and left knee pain. Denies any recent fall, trauma, injury. Patient notes he ran out of his Percocet is prescribed to him through his neurosurgeon that he sees at [facility], he  notes he has a pain contract with his physician. No back pain red flags. VSS. Exam revealed mild swelling to left anterior knee, no midline spinal tenderness. No neuro deficits. Patient able to stand and ambulate without assistance. Left knee x-ray negative. Due to patient's pain being consistent with his chronic pain and exam unremarkable, I do not suspect epidural abscess, spinal cord compression or cauda equina syndrome and do not feel that any further workup or imaging is warranted at this time. Discussed with patient that I'm unable to give him narcotics for pain control to go home with at discharge due to his pain contract. Plan to discharge patient home with NSAIDs and muscle relaxant and advised patient to follow up with his neurosurgeon this week.  Evaluation does not show pathology requring ongoing emergent intervention or admission. Pt is hemodynamically stable and mentating appropriately. Discussed findings/results and plan with patient/guardian, who agrees with plan. All questions answered. Return precautions discussed and outpatient  follow up given.    I personally performed the services described in this documentation, which was scribed in my presence. The recorded information has been reviewed and is accurate.    Satira Sark [place], [place] 09/16/15 1455  Doug Sou, MD 09/16/15 [phone]

## 2015-09-16 NOTE — Discharge Instructions (Signed)
Take your medications as prescribed. I also recommend taking ibuprofen and/or Tylenol as prescribed over-the-counter for pain relief. You may also apply ice to affected regions for 15-20 minutes 3-4 times daily for pain relief. Follow-up with your neurosurgeon regarding management of your chronic pain. I recommend trying to schedule an earlier appointment for sooner evaluation. Return to the emergency department if symptoms worsen or new onset of fever, numbness, tingling, saddle anesthesia, loss of bowel or bladder, weakness.

## 2015-09-16 NOTE — ED Notes (Signed)
PT asking when the doctor will be in to see them Pt stated " I was the first one here this morning but decided to get something to eat . I did not check in until 10:00

## 2015-09-16 NOTE — ED Notes (Signed)
Declined W/C at D/C and was escorted to lobby by RN. 

## 2015-09-16 NOTE — ED Notes (Signed)
Pt reports back and Thigh pain for 2 weeks . Pt can not explain how pain started. Pt denies any type of injury.

## 2016-07-19 ENCOUNTER — Emergency Department (HOSPITAL_COMMUNITY)
Admission: EM | Admit: 2016-07-19 | Discharge: 2016-07-19 | Disposition: A | Discharge status: Home / Self Care | Payer: Medicare HMO | Attending: Emergency Medicine | Admitting: Emergency Medicine

## 2016-07-19 ENCOUNTER — Emergency Department (HOSPITAL_COMMUNITY): Payer: Medicare HMO

## 2016-07-19 ENCOUNTER — Encounter (HOSPITAL_COMMUNITY): Payer: Self-pay | Admitting: Emergency Medicine

## 2016-07-19 DIAGNOSIS — Y999 Unspecified external cause status: Secondary | ICD-10-CM | POA: Insufficient documentation

## 2016-07-19 DIAGNOSIS — Z87891 Personal history of nicotine dependence: Secondary | ICD-10-CM | POA: Diagnosis not present

## 2016-07-19 DIAGNOSIS — S22088A Other fracture of T11-T12 vertebra, initial encounter for closed fracture: Secondary | ICD-10-CM | POA: Diagnosis not present

## 2016-07-19 DIAGNOSIS — S6991XA Unspecified injury of right wrist, hand and finger(s), initial encounter: Secondary | ICD-10-CM | POA: Diagnosis present

## 2016-07-19 DIAGNOSIS — S52301A Unspecified fracture of shaft of right radius, initial encounter for closed fracture: Secondary | ICD-10-CM | POA: Insufficient documentation

## 2016-07-19 DIAGNOSIS — Q7191 Unspecified reduction defect of right upper limb: Secondary | ICD-10-CM | POA: Insufficient documentation

## 2016-07-19 DIAGNOSIS — W010XXA Fall on same level from slipping, tripping and stumbling without subsequent striking against object, initial encounter: Secondary | ICD-10-CM | POA: Diagnosis not present

## 2016-07-19 DIAGNOSIS — Z79899 Other long term (current) drug therapy: Secondary | ICD-10-CM | POA: Insufficient documentation

## 2016-07-19 DIAGNOSIS — S22080A Wedge compression fracture of T11-T12 vertebra, initial encounter for closed fracture: Secondary | ICD-10-CM

## 2016-07-19 DIAGNOSIS — Y929 Unspecified place or not applicable: Secondary | ICD-10-CM | POA: Insufficient documentation

## 2016-07-19 DIAGNOSIS — Y9389 Activity, other specified: Secondary | ICD-10-CM | POA: Insufficient documentation

## 2016-07-19 DIAGNOSIS — S52501A Unspecified fracture of the lower end of right radius, initial encounter for closed fracture: Secondary | ICD-10-CM

## 2016-07-19 LAB — BASIC METABOLIC PANEL WITH GFR
Anion gap: 12 (ref 5–15)
BUN: 7 mg/dL (ref 6–20)
CO2: 25 mmol/L (ref 22–32)
Calcium: 9.2 mg/dL (ref 8.9–10.3)
Chloride: 101 mmol/L (ref 101–111)
Creatinine, Ser: 0.66 mg/dL (ref 0.61–1.24)
GFR calc Af Amer: >60 mL/min
GFR calc non Af Amer: >60 mL/min
Glucose, Bld: 104 mg/dL — ABNORMAL HIGH (ref 65–99)
Potassium: 3.6 mmol/L (ref 3.5–5.1)
Sodium: 138 mmol/L (ref 135–145)

## 2016-07-19 LAB — CBC WITH DIFFERENTIAL/PLATELET
Basophils Absolute: 0 K/uL (ref 0.0–0.1)
Basophils Relative: 0 %
Eosinophils Absolute: 0 K/uL (ref 0.0–0.7)
Eosinophils Relative: 0 %
HCT: 37.8 % — ABNORMAL LOW (ref 39.0–52.0)
Hemoglobin: 13.2 g/dL (ref 13.0–17.0)
Lymphocytes Relative: 22 %
Lymphs Abs: 1.8 K/uL (ref 0.7–4.0)
MCH: 35.2 pg — ABNORMAL HIGH (ref 26.0–34.0)
MCHC: 34.9 g/dL (ref 30.0–36.0)
MCV: 100.8 fL — ABNORMAL HIGH (ref 78.0–100.0)
Monocytes Absolute: 0.9 K/uL (ref 0.1–1.0)
Monocytes Relative: 11 %
Neutro Abs: 5.3 K/uL (ref 1.7–7.7)
Neutrophils Relative %: 67 %
Platelets: 268 K/uL (ref 150–400)
RBC: 3.75 MIL/uL — ABNORMAL LOW (ref 4.22–5.81)
RDW: 14.1 % (ref 11.5–15.5)
WBC: 8 K/uL (ref 4.0–10.5)

## 2016-07-19 MED ORDER — HYDROMORPHONE HCL 2 MG/ML IJ SOLN
1.0000 mg | Freq: Once | INTRAMUSCULAR | Status: AC
  Administered 2016-07-19: 1 mg via INTRAMUSCULAR
  Filled 2016-07-19: qty 1

## 2016-07-19 MED ORDER — OXYCODONE-ACETAMINOPHEN 5-325 MG PO TABS
1.0000 | ORAL_TABLET | Freq: Once | ORAL | Status: AC
  Administered 2016-07-19: 1 via ORAL

## 2016-07-19 MED ORDER — OXYCODONE-ACETAMINOPHEN 5-325 MG PO TABS
ORAL_TABLET | ORAL | Status: AC
  Filled 2016-07-19: qty 1

## 2016-07-19 MED ORDER — LIDOCAINE-EPINEPHRINE (PF) 2 %-1:200000 IJ SOLN
20.0000 mL | Freq: Once | INTRAMUSCULAR | Status: AC
  Administered 2016-07-19: 20 mL
  Filled 2016-07-19: qty 20

## 2016-07-19 MED ORDER — OXYCODONE-ACETAMINOPHEN 5-325 MG PO TABS
1.0000 | ORAL_TABLET | Freq: Once | ORAL | Status: AC
  Administered 2016-07-19: 1 via ORAL
  Filled 2016-07-19: qty 1

## 2016-07-19 NOTE — ED Triage Notes (Signed)
Pt. Stated, I slid on carpet and fell. Deformity to right wrist.  my back is hurting in the middle to the bottom.

## 2016-07-19 NOTE — ED Provider Notes (Signed)
Patient was fitted with a thoracic brace.  He understands he is to come back in the morning to short stay for surgical repair of his radial fracture   Omar FavorGail Onisha Cedeno, NP 07/19/16 2246    [name], MD 07/20/16 [phone]

## 2016-07-19 NOTE — ED Notes (Signed)
Pt transported to xray 

## 2016-07-19 NOTE — ED Notes (Signed)
Pt ambulated to restroom independently without difficulty and voided of urine. No complaints.

## 2016-07-19 NOTE — ED Notes (Signed)
Pt transported to CT ?

## 2016-07-19 NOTE — ED Notes (Signed)
Ortho at bedside with back brace

## 2016-07-19 NOTE — ED Notes (Signed)
Dr Erma HeritageIsaacs, Sam PA, and Ortho tech with patient for splint placement

## 2016-07-19 NOTE — Discharge Instructions (Signed)
[facility] at 10 AM for surgical admission. He will need to go to the second floor short stay area. Do not eat or drink anything after midnight. Continue wearing T LSO brace. He will also need a follow-up with neurosurgery as an outpatient. Referral for Dr. Wynetta Emerycram given. Take home pain medications as needed.

## 2016-07-19 NOTE — ED Notes (Signed)
E-signature not available, patient verbalized understanding of instructions, denies concerns with dc

## 2016-07-19 NOTE — Progress Notes (Signed)
Orthopedic Tech Progress Note Patient Details:  Omar Sandoval 04/22/1956 [id number]  Ortho Devices Type of Ortho Device: Sugartong splint Ortho Device/Splint Location: Right Arm/Wrist Ortho Device/Splint Interventions: Application As per doctor's orders applied Sugar Tong to Right Arm/Wrist.  Omar Sandoval, Delvecchio Madole C 07/19/2016, 7:05 PM

## 2016-07-19 NOTE — ED Provider Notes (Signed)
[facility]-EMERGENCY DEPT Provider Note   CSN: [id number] Arrival date & time: 07/19/16  1247     History   Chief Complaint Chief Complaint  Patient presents with  . Fall  . Wrist Pain  . Back Pain    HPI Omar Sandoval is a 60 y.o. male with a past medical history of HIV who presents to the ED today to be evaluated after a mechanical fall. Patient states that this morning around 8 AM he was walking in his living room when he "missed a step" causing him to fall backwards landing on his left outstretched hand as well as his back. He has been able to ambulate without difficulty since. He took his home Percocet with minimal relief of his pain. He is complaining of persistent left wrist and back pain. He denies any bowel or bladder incontinence, numbness or tingling, head injury.  HPI  Past Medical History:  Diagnosis Date  . HIV (human immunodeficiency virus infection) (HCC)   . Immune deficiency disorder [facility](HCC)     Patient Active Problem List   Diagnosis Date Noted  . HIV disease (HCC) 01/06/2013  . 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 COLONOSCOPY    . FRACTURE SURGERY     left ankle and right foot  . HERNIA REPAIR    . SURGERY SCROTAL / TESTICULAR     nondecended testes       Home Medications    Prior to Admission medications   Medication Sig Start Date End Date Taking? Authorizing Provider  Abacavir-Dolutegravir-Lamivud (TRIUMEQ) 600-50-300 MG TABS Take 1 tablet by mouth at bedtime.  03/03/14   Historical Provider, MD  acetaminophen (TYLENOL) 325 MG tablet Take 2 tablets (650 mg total) by mouth every 6 (six) hours as needed. 08/26/15   Ace GinsSerena Y Sam, PA-C  benzonatate (TESSALON) 100 MG capsule Take 1 capsule (100 mg total) by mouth every 8 (eight) hours. 08/26/15   Ace GinsSerena Y Sam, PA-C  ciprofloxacin (CIPRO) 500 MG tablet Take 1 tablet (500 mg total) by mouth 2 (two) times daily. 12/11/13   Renne CriglerJoshua Geiple, PA-C  Cyanocobalamin  (VITAMIN B-12 IJ) Inject 1 Units as directed every 30 (thirty) days.    Historical Provider, MD  ibuprofen (ADVIL,MOTRIN) 800 MG tablet Take 1 tablet (800 mg total) by mouth 3 (three) times daily. 08/26/15   Ace GinsSerena Y Sam, PA-C  methocarbamol (ROBAXIN) 500 MG tablet Take 1 tablet (500 mg total) by mouth 2 (two) times daily. 09/16/15   Barrett HenleNicole Elizabeth Nadeau, PA-C  oxyCODONE-acetaminophen (PERCOCET) 10-325 MG per tablet Take 0.5-1 tablets by mouth every 4 (four) hours as needed for pain. Patient taking differently: Take 1 tablet by mouth every 4 (four) hours as needed for pain.  06/21/13   Arthor CaptainAbigail Harris, PA-C  PRESCRIPTION MEDICATION Inject 1 Units as directed See admin instructions. Steroid injections every 2 to 3 months, get shots from doctor's office.    Historical Provider, MD    Family History Family History  Problem Relation Age of Onset  . Cancer Mother     lung  . Cancer Brother     lung, liver, and colon    Social History Social History  Substance Use Topics  . Smoking status: Former Smoker    Packs/day: 0.00    Types: Cigarettes  . Smokeless tobacco: Never Used  . Alcohol use 1.8 oz/week    3 Cans of beer per week     Comment: beer daily  Allergies   Vicodin [hydrocodone-acetaminophen]   Review of Systems Review of Systems  All other systems reviewed and are negative.    Physical Exam Updated Vital Signs BP 134/84 (BP Location: Left Arm)   Pulse 83   Temp 99.2 F (37.3 C) (Oral)   Resp 18   Ht 5\' 6"  (1.676 m)   Wt 96.2 kg   SpO2 96%   BMI 34.22 kg/m   Physical Exam  Constitutional: He is oriented to person, place, and time. He appears well-developed and well-nourished. No distress.  HENT:  Head: Normocephalic and atraumatic.  Mouth/Throat: No oropharyngeal exudate.  Eyes: Conjunctivae and EOM are normal. Pupils are equal, round, and reactive to light. Right eye exhibits no discharge. Left eye exhibits no discharge. No scleral icterus.    Cardiovascular: Normal rate, regular rhythm, normal heart sounds and intact distal pulses.  Exam reveals no gallop and no friction rub.   No murmur heard. Pulmonary/Chest: Effort normal and breath sounds normal. No respiratory distress. He has no wheezes. He has no rales. He exhibits no tenderness.  Abdominal: Soft. He exhibits no distension. There is no tenderness. There is no guarding.  Musculoskeletal: Normal range of motion. He exhibits no edema.  Midline spinal tenderness of lower thoracic and lumbar spine. No step-offs or obvious bony deformities. Negative SLR.  Obvious bony deformity of right wrist with significant swelling. No ecchymosis. No open fracture.  Neurological: He is alert and oriented to person, place, and time.  Strength 5/5 throughout. No sensory deficits. No gait abnormality.   Skin: Skin is warm and dry. No rash noted. He is not diaphoretic. No erythema. No pallor.  Psychiatric: He has a normal mood and affect. His behavior is normal.  Nursing note and vitals reviewed.    ED Treatments / Results  Labs (all labs ordered are listed, but only abnormal results are displayed) Labs Reviewed - No data to display  EKG  EKG Interpretation None       Radiology Dg Thoracic Spine 2 View  Result Date: 07/19/2016 CLINICAL DATA:  Fall with pain EXAM: THORACIC SPINE 2 VIEWS COMPARISON:  08/26/2015 FINDINGS: There are 12 rib pairs identified. Suspect compression fracture deformity of T12. Mild wedging of T11 appears grossly stable. IMPRESSION: Suspect compression fracture deformity of T12. CT may be obtained for further evaluation. Electronically Signed   By: Jasmine Pang M.D.   On: 07/19/2016 18:13   Dg Lumbar Spine Complete  Result Date: 07/19/2016 CLINICAL DATA:  Slipped and fell having pain down the spine EXAM: LUMBAR SPINE - COMPLETE 4+ VIEW COMPARISON:  07/04/2009 FINDINGS: Five non rib-bearing lumbar type vertebra. Calcified pelvic phleboliths. SI joints are  patent. Mild atherosclerosis of the aorta. Superior endplate irregularity of T12. Mild wedging of T11 appears grossly unchanged. Possible mild superior endplate depression at L1. Degenerative osteophytes at L2-L3 and L3-L4. Mild disc space narrowing at L2-L3 and L3-L4. IMPRESSION: 1. Possible superior endplate compression fracture deformity at T12 with questionable irregularity of superior endplate of L1. Consider CT for further evaluation. 2. Degenerative changes of the lumbar spine. Electronically Signed   By: Jasmine Pang M.D.   On: 07/19/2016 18:11   Dg Wrist Complete Right  Result Date: 07/19/2016 CLINICAL DATA:  Right wrist pain status post fall. EXAM: RIGHT WRIST - COMPLETE 3+ VIEW COMPARISON:  None. FINDINGS: There is a transverse comminuted mildly impacted fracture of the distal radial shaft . There is a 1/2 shaft posterior dislocation of the distal fracture fragment and no significant  angulation. There is an associated avulsion fracture of the ulnar styloid. Deformity of the distal ulna likely from a healed fracture is noted. There is an associated soft tissue swelling and hematoma. IMPRESSION: Comminuted mildly impacted posteriorly dislocated fracture of the distal radial shaft. Ulnar-styloid avulsion fracture. Electronically Signed   By: Ted Mcalpineobrinka  Dimitrova M.D.   On: 07/19/2016 13:43    Procedures Reduction of fracture Date/Time: 07/19/2016 8:58 PM Performed by: Gaylyn RongWLESS, SAMANTHA TRIPP Authorized by: Gaylyn RongWLESS, SAMANTHA TRIPP  Consent: Verbal consent obtained. Risks and benefits: risks, benefits and alternatives were discussed Consent given by: patient Patient understanding: patient states understanding of the procedure being performed Patient consent: the patient's understanding of the procedure matches consent given Procedure consent: procedure consent matches procedure scheduled Test results: test results available and properly labeled Site marked: the operative site was  marked Imaging studies: imaging studies available Required items: required blood products, implants, devices, and special equipment available Patient identity confirmed: verbally with patient and arm band Local anesthesia used: yes Anesthesia: hematoma block  Anesthesia: Local anesthesia used: yes Local Anesthetic: lidocaine 2% with epinephrine Anesthetic total: 10 mL  Sedation: Patient sedated: no Patient tolerance: Patient tolerated the procedure well with no immediate complications    (including critical care time)    Medications Ordered in ED Medications  oxyCODONE-acetaminophen (PERCOCET/ROXICET) 5-325 MG per tablet 1 tablet (1 tablet Oral Given 07/19/16 1306)  oxyCODONE-acetaminophen (PERCOCET/ROXICET) 5-325 MG per tablet 1 tablet (1 tablet Oral Given 07/19/16 1809)  lidocaine-EPINEPHrine (XYLOCAINE W/EPI) 2 %-1:200000 (PF) injection 20 mL (20 mLs Infiltration Given 07/19/16 1809)     Initial Impression / Assessment and Plan / ED Course  I have reviewed the triage vital signs and the nursing notes.  Pertinent labs & imaging results that were available during my care of the patient were reviewed by me and considered in my medical decision making (see chart for details).  Clinical Course     60 year old male presents to the ED to be evaluated after mechanical fall that occurred several hours prior to arrival. Patient slipped and fell backwards landing on his back and right wrist. He is ambulatory in the ED without difficulty. No neurological deficits on exam. He has been obvious deformity of his right wrist. He is neurovascularly intact however. X-ray reveals comminuted mildly impacted posteriorly dislocated fracture of the distal radial shaft. Bedside ultrasound and hematoma block performed. Patient was placed in finger traps for approximately 15-20 minutes and splint applied. Post reduction x-ray reveals slight improvement of dorsal displacement however there is still more  than one half shaft width dorsal displacement and mild dorsal angulation. Pain is controlled however. Patient also has questionable fracture of T12. CT ordered to further evaluate. This reveals acute, comminuted T12 vertebral body compression fracture with approximately 33% loss of vertebral body height.  Spoke with Dr. Wynetta Emeryram with neurosurgery who states that if patient is able to ambulate, normal postvoid residual and pain is controlled patient may be discharged with T LSO brace and outpatient follow-up. Post void residual measured which was normal. He is urinating without difficulty. Pain is controlled.  Spoke with Dr.Ortmann with hand surgery who reviewed the films and states that patient will need surgical repair of his wrist. He recommends that I discussed with patient options of admission tonight for surgical repair or patient may come back tomorrow. I spoke with patient who wishes to go home tonight.  patient instructed to return to second for short stay tomorrow at 10 AM. He is to remain NPO  after midnight. Basic labs, chest x-ray and EKG ordered per Dr. Glenna Durand request. I appreciate his input.  Patient was discussed with and seen by Dr. Madilyn Hook who agrees with the treatment plan.    Final Clinical Impressions(s) / ED Diagnoses   Final diagnoses:  Reduction defect of upper extremity, right    New Prescriptions New Prescriptions   No medications on file     Dub Mikes, PA-C 07/19/16 2059    Tilden Fossa, MD 07/20/16 1723

## 2016-07-20 ENCOUNTER — Ambulatory Visit (HOSPITAL_COMMUNITY)
Admission: EM | Admit: 2016-07-20 | Discharge: 2016-07-20 | Disposition: A | Discharge status: Home / Self Care | Payer: Medicare HMO | Attending: Orthopedic Surgery | Admitting: Orthopedic Surgery

## 2016-07-20 ENCOUNTER — Inpatient Hospital Stay (HOSPITAL_COMMUNITY): Payer: Medicare HMO | Admitting: Certified Registered Nurse Anesthetist

## 2016-07-20 ENCOUNTER — Encounter (HOSPITAL_COMMUNITY): Payer: Self-pay | Admitting: *Deleted

## 2016-07-20 ENCOUNTER — Encounter (HOSPITAL_COMMUNITY)
Admission: EM | Disposition: A | Discharge status: Home / Self Care | Payer: Self-pay | Source: Home / Self Care | Attending: Orthopedic Surgery

## 2016-07-20 DIAGNOSIS — Z79899 Other long term (current) drug therapy: Secondary | ICD-10-CM | POA: Diagnosis not present

## 2016-07-20 DIAGNOSIS — Z87891 Personal history of nicotine dependence: Secondary | ICD-10-CM | POA: Insufficient documentation

## 2016-07-20 DIAGNOSIS — B20 Human immunodeficiency virus [HIV] disease: Secondary | ICD-10-CM | POA: Insufficient documentation

## 2016-07-20 DIAGNOSIS — W010XXA Fall on same level from slipping, tripping and stumbling without subsequent striking against object, initial encounter: Secondary | ICD-10-CM | POA: Insufficient documentation

## 2016-07-20 DIAGNOSIS — S52571A Other intraarticular fracture of lower end of right radius, initial encounter for closed fracture: Secondary | ICD-10-CM | POA: Insufficient documentation

## 2016-07-20 HISTORY — PX: OPEN REDUCTION INTERNAL FIXATION (ORIF) DISTAL RADIAL FRACTURE: SHX5989

## 2016-07-20 SURGERY — OPEN REDUCTION INTERNAL FIXATION (ORIF) DISTAL RADIUS FRACTURE
Anesthesia: Monitor Anesthesia Care | Site: Wrist | Laterality: Right

## 2016-07-20 MED ORDER — PROPOFOL 10 MG/ML IV BOLUS
INTRAVENOUS | Status: AC
  Filled 2016-07-20: qty 20

## 2016-07-20 MED ORDER — FENTANYL CITRATE (PF) 100 MCG/2ML IJ SOLN
INTRAMUSCULAR | Status: AC
  Filled 2016-07-20: qty 2

## 2016-07-20 MED ORDER — METHOCARBAMOL 500 MG PO TABS: 500.0000 mg | ORAL_TABLET | Freq: Four times a day (QID) | ORAL | 0 refills | Status: DC

## 2016-07-20 MED ORDER — DIPHENHYDRAMINE HCL 50 MG/ML IJ SOLN
INTRAMUSCULAR | Status: DC | PRN
  Administered 2016-07-20: 25 mg via INTRAVENOUS

## 2016-07-20 MED ORDER — VITAMIN C 500 MG PO TABS: 500.0000 mg | ORAL_TABLET | Freq: Every day | ORAL | 0 refills | Status: DC

## 2016-07-20 MED ORDER — MIDAZOLAM HCL 5 MG/5ML IJ SOLN
INTRAMUSCULAR | Status: DC | PRN
  Administered 2016-07-20: 2 mg via INTRAVENOUS

## 2016-07-20 MED ORDER — FENTANYL CITRATE (PF) 100 MCG/2ML IJ SOLN
INTRAMUSCULAR | Status: DC | PRN
  Administered 2016-07-20: 100 ug via INTRAVENOUS
  Administered 2016-07-20 (×2): 50 ug via INTRAVENOUS

## 2016-07-20 MED ORDER — DOCUSATE SODIUM 100 MG PO CAPS: 100.0000 mg | ORAL_CAPSULE | Freq: Two times a day (BID) | ORAL | 0 refills | Status: DC

## 2016-07-20 MED ORDER — OXYCODONE-ACETAMINOPHEN 5-325 MG PO TABS: 1.0000 | ORAL_TABLET | Freq: Three times a day (TID) | ORAL | 0 refills | Status: AC

## 2016-07-20 MED ORDER — CEFAZOLIN SODIUM-DEXTROSE 2-4 GM/100ML-% IV SOLN
2.0000 g | INTRAVENOUS | Status: AC
  Administered 2016-07-20: 2 g via INTRAVENOUS
  Filled 2016-07-20: qty 100

## 2016-07-20 MED ORDER — PROPOFOL 10 MG/ML IV BOLUS
INTRAVENOUS | Status: DC | PRN
  Administered 2016-07-20 (×2): 50 mg via INTRAVENOUS

## 2016-07-20 MED ORDER — CHLORHEXIDINE GLUCONATE 4 % EX LIQD: 60.0000 mL | Freq: Once | CUTANEOUS | Status: DC

## 2016-07-20 MED ORDER — PROPOFOL 1000 MG/100ML IV EMUL
INTRAVENOUS | Status: AC
  Filled 2016-07-20: qty 100

## 2016-07-20 MED ORDER — 0.9 % SODIUM CHLORIDE (POUR BTL) OPTIME
TOPICAL | Status: DC | PRN
  Administered 2016-07-20: 1000 mL

## 2016-07-20 MED ORDER — LACTATED RINGERS IV SOLN
INTRAVENOUS | Status: DC | PRN
  Administered 2016-07-20: 11:00:00 via INTRAVENOUS

## 2016-07-20 MED ORDER — MIDAZOLAM HCL 2 MG/2ML IJ SOLN
INTRAMUSCULAR | Status: AC
  Filled 2016-07-20: qty 2

## 2016-07-20 MED ORDER — PROPOFOL 500 MG/50ML IV EMUL
INTRAVENOUS | Status: DC | PRN
  Administered 2016-07-20: 75 ug/kg/min via INTRAVENOUS

## 2016-07-20 MED ORDER — LACTATED RINGERS IV SOLN: INTRAVENOUS | Status: DC

## 2016-07-20 SURGICAL SUPPLY — 65 items
BANDAGE ACE 4X5 VEL STRL LF (GAUZE/BANDAGES/DRESSINGS) ×3 IMPLANT
BANDAGE ELASTIC 3 VELCRO ST LF (GAUZE/BANDAGES/DRESSINGS) ×3 IMPLANT
BIT DRILL 2.2 SS TIBIAL (BIT) ×3 IMPLANT
BLADE SURG ROTATE 9660 (MISCELLANEOUS) IMPLANT
BNDG ESMARK 4X9 LF (GAUZE/BANDAGES/DRESSINGS) ×3 IMPLANT
BNDG GAUZE ELAST 4 BULKY (GAUZE/BANDAGES/DRESSINGS) ×3 IMPLANT
CANISTER SUCTION 2500CC (MISCELLANEOUS) ×3 IMPLANT
CORDS BIPOLAR (ELECTRODE) ×3 IMPLANT
COVER SURGICAL LIGHT HANDLE (MISCELLANEOUS) ×3 IMPLANT
CUFF TOURNIQUET SINGLE 18IN (TOURNIQUET CUFF) ×3 IMPLANT
CUFF TOURNIQUET SINGLE 24IN (TOURNIQUET CUFF) IMPLANT
DECANTER SPIKE VIAL GLASS SM (MISCELLANEOUS) ×3 IMPLANT
DRAPE OEC MINIVIEW 54X84 (DRAPES) ×3 IMPLANT
DRAPE SURG 17X11 SM STRL (DRAPES) ×3 IMPLANT
DRSG ADAPTIC 3X8 NADH LF (GAUZE/BANDAGES/DRESSINGS) ×3 IMPLANT
GAUZE SPONGE 4X4 12PLY STRL (GAUZE/BANDAGES/DRESSINGS) ×3 IMPLANT
GAUZE SPONGE 4X4 16PLY XRAY LF (GAUZE/BANDAGES/DRESSINGS) ×3 IMPLANT
GLOVE BIOGEL PI IND STRL 8.5 (GLOVE) ×1 IMPLANT
GLOVE BIOGEL PI INDICATOR 8.5 (GLOVE) ×2
GLOVE SURG ORTHO 8.0 STRL STRW (GLOVE) ×3 IMPLANT
GOWN STRL REUS W/ TWL LRG LVL3 (GOWN DISPOSABLE) ×1 IMPLANT
GOWN STRL REUS W/ TWL XL LVL3 (GOWN DISPOSABLE) ×1 IMPLANT
GOWN STRL REUS W/TWL LRG LVL3 (GOWN DISPOSABLE) ×2
GOWN STRL REUS W/TWL XL LVL3 (GOWN DISPOSABLE) ×2
K-WIRE 1.6 (WIRE) ×2
K-WIRE FX5X1.6XNS BN SS (WIRE) ×1
KIT BASIN OR (CUSTOM PROCEDURE TRAY) ×3 IMPLANT
KIT ROOM TURNOVER OR (KITS) ×3 IMPLANT
KWIRE FX5X1.6XNS BN SS (WIRE) ×1 IMPLANT
NEEDLE HYPO 25X1 1.5 SAFETY (NEEDLE) ×3 IMPLANT
NS IRRIG 1000ML POUR BTL (IV SOLUTION) ×3 IMPLANT
PACK ORTHO EXTREMITY (CUSTOM PROCEDURE TRAY) ×3 IMPLANT
PAD ARMBOARD 7.5X6 YLW CONV (MISCELLANEOUS) ×6 IMPLANT
PAD CAST 4YDX4 CTTN HI CHSV (CAST SUPPLIES) ×1 IMPLANT
PADDING CAST COTTON 4X4 STRL (CAST SUPPLIES) ×2
PEG LOCKING SMOOTH 2.2X18 (Peg) ×3 IMPLANT
PEG LOCKING SMOOTH 2.2X20 (Screw) ×3 IMPLANT
PEG LOCKING SMOOTH 2.2X22 (Screw) ×9 IMPLANT
PLATE DVR CROSSLOCK STD RT (Plate) ×3 IMPLANT
PUTTY DBM STAGRAFT PLUS 2CC (Putty) ×3 IMPLANT
SCREW LOCK 14X2.7X 3 LD TPR (Screw) ×3 IMPLANT
SCREW LOCK 16X2.7X 3 LD TPR (Screw) ×1 IMPLANT
SCREW LOCK 20X2.7X 3 LD TPR (Screw) ×1 IMPLANT
SCREW LOCKING 2.7X14 (Screw) ×6 IMPLANT
SCREW LOCKING 2.7X15MM (Screw) ×3 IMPLANT
SCREW LOCKING 2.7X16 (Screw) ×2 IMPLANT
SCREW LOCKING 2.7X20MM (Screw) ×2 IMPLANT
SCREW MULTI DIRECTIONAL 2.7X20 (Screw) ×3 IMPLANT
SOAP 2 % CHG 4 OZ (WOUND CARE) ×3 IMPLANT
SPLINT FIBERGLASS 3X35 (CAST SUPPLIES) ×3 IMPLANT
SPONGE LAP 4X18 X RAY DECT (DISPOSABLE) ×3 IMPLANT
SUT PROLENE 4 0 P 3 18 (SUTURE) ×3 IMPLANT
SUT VIC AB 2-0 CT1 27 (SUTURE) ×2
SUT VIC AB 2-0 CT1 TAPERPNT 27 (SUTURE) ×1 IMPLANT
SUT VIC AB 2-0 FS1 27 (SUTURE) IMPLANT
SUT VIC AB 4-0 P-3 18X BRD (SUTURE) ×1 IMPLANT
SUT VIC AB 4-0 P3 18 (SUTURE) ×2
SUT VICRYL 4-0 PS2 18IN ABS (SUTURE) IMPLANT
SYR CONTROL 10ML LL (SYRINGE) IMPLANT
TOWEL OR 17X24 6PK STRL BLUE (TOWEL DISPOSABLE) ×3 IMPLANT
TOWEL OR 17X26 10 PK STRL BLUE (TOWEL DISPOSABLE) ×3 IMPLANT
TUBE CONNECTING 12'X1/4 (SUCTIONS) ×1
TUBE CONNECTING 12X1/4 (SUCTIONS) ×2 IMPLANT
WATER STERILE IRR 1000ML POUR (IV SOLUTION) ×3 IMPLANT
YANKAUER SUCT BULB TIP NO VENT (SUCTIONS) IMPLANT

## 2016-07-20 NOTE — H&P (Signed)
Omar Sandoval is an 60 y.o. male.   Chief Complaint: LEFT DISTAL RADIUS FRACTURE HPI: Omar Sandoval is a 60 y.o. male with a past medical history of HIV who presents to the ED today to be evaluated after a mechanical fall. Patient states that this morning around 8 AM he was walking in his living room when he "missed a step" causing him to fall backwards landing on his left outstretched hand as well as his back. He has been able to ambulate without difficulty since. He took his home Percocet with minimal relief of his pain. He is complaining of persistent left wrist and back pain. He denies any bowel or bladder incontinence, numbness or tingling, head injury. PT HERE FOR SURGERY TODAY  Past Medical History:  Diagnosis Date  . HIV (human immunodeficiency virus infection) ([place])   . Immune deficiency disorder [facility])     Past Surgical History:  Procedure Laterality Date  . COLONOSCOPY    . FRACTURE SURGERY     left ankle and right foot  . HERNIA REPAIR    . SURGERY SCROTAL / TESTICULAR     nondecended testes    Family History  Problem Relation Age of Onset  . Cancer Mother     lung  . Cancer Brother     lung, liver, and colon   Social History:  reports that he has quit smoking. His smoking use included Cigarettes. He smoked 0.00 packs per day. He has never used smokeless tobacco. He reports that he drinks about 1.8 oz of alcohol per week . He reports that he uses drugs, including Marijuana.  Allergies:  Allergies  Allergen Reactions  . Vicodin [Hydrocodone-Acetaminophen] Nausea Only and Other (See Comments)    GI Upset    Medications Prior to Admission  Medication Sig Dispense Refill  . Abacavir-Dolutegravir-Lamivud (TRIUMEQ) 600-50-300 MG TABS Take 1 tablet by mouth at bedtime.     [name] acetaminophen (TYLENOL) 325 MG tablet Take 2 tablets (650 mg total) by mouth every 6 (six) hours as needed. 20 tablet 0  . benzonatate (TESSALON) 100 MG capsule Take 1 capsule (100 mg total) by mouth  every 8 (eight) hours. 21 capsule 0  . ciprofloxacin (CIPRO) 500 MG tablet Take 1 tablet (500 mg total) by mouth 2 (two) times daily. 20 tablet 0  . Cyanocobalamin (VITAMIN B-12 IJ) Inject 1 Units as directed every 30 (thirty) days.    [name] ibuprofen (ADVIL,MOTRIN) 800 MG tablet Take 1 tablet (800 mg total) by mouth 3 (three) times daily. 21 tablet 0  . methocarbamol (ROBAXIN) 500 MG tablet Take 1 tablet (500 mg total) by mouth 2 (two) times daily. 20 tablet 0  . oxyCODONE-acetaminophen (PERCOCET) 10-325 MG per tablet Take 0.5-1 tablets by mouth every 4 (four) hours as needed for pain. (Patient taking differently: Take 1 tablet by mouth every 4 (four) hours as needed for pain. ) 10 tablet 0  . PRESCRIPTION MEDICATION Inject 1 Units as directed See admin instructions. Steroid injections every 2 to 3 months, get shots from doctor's office.      Results for orders placed or performed during the hospital encounter of 07/19/16 (from the past 48 hour(s))  CBC with Differential     Status: Abnormal   Collection Time: 07/19/16  9:15 PM  Result Value Ref Range   WBC 8.0 4.0 - 10.5 K/uL   RBC 3.75 (L) 4.22 - 5.81 MIL/uL   Hemoglobin 13.2 13.0 - 17.0 g/dL   HCT 37.8 (L) 39.0 -  52.0 %   MCV 100.8 (H) 78.0 - 100.0 fL   MCH 35.2 (H) 26.0 - 34.0 pg   MCHC 34.9 30.0 - 36.0 g/dL   RDW 14.1 11.5 - 15.5 %   Platelets 268 150 - 400 K/uL   Neutrophils Relative % 67 %   Neutro Abs 5.3 1.7 - 7.7 K/uL   Lymphocytes Relative 22 %   Lymphs Abs 1.8 0.7 - 4.0 K/uL   Monocytes Relative 11 %   Monocytes Absolute 0.9 0.1 - 1.0 K/uL   Eosinophils Relative 0 %   Eosinophils Absolute 0.0 0.0 - 0.7 K/uL   Basophils Relative 0 %   Basophils Absolute 0.0 0.0 - 0.1 K/uL  Basic metabolic panel     Status: Abnormal   Collection Time: 07/19/16  9:15 PM  Result Value Ref Range   Sodium 138 135 - 145 mmol/L   Potassium 3.6 3.5 - 5.1 mmol/L   Chloride 101 101 - 111 mmol/L   CO2 25 22 - 32 mmol/L   Glucose, Bld 104 (H) 65 -  99 mg/dL   BUN 7 6 - 20 mg/dL   Creatinine, Ser 0.66 0.61 - 1.24 mg/dL   Calcium 9.2 8.9 - 10.3 mg/dL   GFR calc non Af Amer >60 >60 mL/min   GFR calc Af Amer >60 >60 mL/min    Comment: (NOTE) The eGFR has been calculated using the CKD EPI equation. This calculation has not been validated in all clinical situations. eGFR's persistently <60 mL/min signify possible Chronic Kidney Disease.    Anion gap 12 5 - 15   Dg Chest 2 View  Result Date: 07/19/2016 CLINICAL DATA:  Preoperative for right wrist fracture. Previous smoker. History of HIV. EXAM: CHEST  2 VIEW COMPARISON:  08/26/2015 FINDINGS: Shallow inspiration with linear atelectasis in the lung bases. Heart size and pulmonary vascularity are normal. No focal airspace disease or consolidation in the lungs. No blunting of costophrenic angles. No pneumothorax. Prominent right paratracheal shadow likely represents mediastinal fat. Old bilateral rib fractures. IMPRESSION: Shallow inspiration with linear atelectasis in the lung bases. Electronically Signed   By: Lucienne Capers M.D.   On: 07/19/2016 21:14   Dg Thoracic Spine 2 View  Result Date: 07/19/2016 CLINICAL DATA:  Fall with pain EXAM: THORACIC SPINE 2 VIEWS COMPARISON:  08/26/2015 FINDINGS: There are 12 rib pairs identified. Suspect compression fracture deformity of T12. Mild wedging of T11 appears grossly stable. IMPRESSION: Suspect compression fracture deformity of T12. CT may be obtained for further evaluation. Electronically Signed   By: Donavan Foil M.D.   On: 07/19/2016 18:13   Dg Lumbar Spine Complete  Result Date: 07/19/2016 CLINICAL DATA:  Slipped and fell having pain down the spine EXAM: LUMBAR SPINE - COMPLETE 4+ VIEW COMPARISON:  07/04/2009 FINDINGS: Five non rib-bearing lumbar type vertebra. Calcified pelvic phleboliths. SI joints are patent. Mild atherosclerosis of the aorta. Superior endplate irregularity of T12. Mild wedging of T11 appears grossly unchanged. Possible  mild superior endplate depression at L1. Degenerative osteophytes at L2-L3 and L3-L4. Mild disc space narrowing at L2-L3 and L3-L4. IMPRESSION: 1. Possible superior endplate compression fracture deformity at T12 with questionable irregularity of superior endplate of L1. Consider CT for further evaluation. 2. Degenerative changes of the lumbar spine. Electronically Signed   By: Donavan Foil M.D.   On: 07/19/2016 18:11   Dg Wrist 2 Views Right  Result Date: 07/19/2016 CLINICAL DATA:  Status post reduction of right wrist fracture. Initial encounter. EXAM: RIGHT WRIST -  2 VIEW COMPARISON:  Right wrist radiographs performed earlier today at 1:14 p.m. FINDINGS: Dorsal displacement of the distal radius fracture fragment is slightly improved, though there is still more than 1/2 shaft width dorsal displacement and mild dorsal angulation. Mild impaction is noted at the distal radial fracture, with underlying comminution. A mildly displaced ulnar styloid fracture is also seen. Soft tissue swelling is noted about the distal forearm and wrist, with an overlying splint. The carpal rows appear grossly intact, and demonstrate normal alignment. IMPRESSION: Dorsal displacement of the distal radius fracture fragment is slightly improved, though there is still more than 1/2 shaft width dorsal displacement and mild dorsal angulation. Mild impaction of the distal radial fracture, with underlying comminution. Mildly displaced ulnar styloid fracture again seen. Electronically Signed   By: Garald Balding M.D.   On: 07/19/2016 19:26   Dg Wrist Complete Right  Result Date: 07/19/2016 CLINICAL DATA:  Right wrist pain status post fall. EXAM: RIGHT WRIST - COMPLETE 3+ VIEW COMPARISON:  None. FINDINGS: There is a transverse comminuted mildly impacted fracture of the distal radial shaft . There is a 1/2 shaft posterior dislocation of the distal fracture fragment and no significant angulation. There is an associated avulsion fracture of  the ulnar styloid. Deformity of the distal ulna likely from a healed fracture is noted. There is an associated soft tissue swelling and hematoma. IMPRESSION: Comminuted mildly impacted posteriorly dislocated fracture of the distal radial shaft. Ulnar-styloid avulsion fracture. Electronically Signed   By: Fidela Salisbury M.D.   On: 07/19/2016 13:43   Ct Lumbar Spine Wo Contrast  Result Date: 07/19/2016 CLINICAL DATA:  60 year old male status post slip and fall with possible T12 compression fracture on radiographs today Initial encounter. EXAM: CT LUMBAR SPINE WITHOUT CONTRAST TECHNIQUE: Multidetector CT imaging of the lumbar spine was performed without intravenous contrast administration. Multiplanar CT image reconstructions were also generated. COMPARISON:  Lumbar radiographs 1738 hours. CT Abdomen and Pelvis 03/28/2009. FINDINGS: Segmentation: Normal. Alignment: Stable lumbar vertebral height and alignment since 2010. CT 12 findings described below. Vertebrae: Comminuted T12 compression fracture with about 33% loss of vertebral body height compared to 2010. Superior and inferior endplate deformities as well as anterior and left lateral vertebral body comminution (series 5, image 5, series 6, image 27 and sagittal image 32). Minimal retropulsion of the posterior superior endplate with preserved AP thecal sac dimension of 13-14 mm. The pedicles and posterior elements appear to remain intact. Visible posterior ribs are intact. No lumbar vertebral fracture identified. Visible sacrum and SI joints appear intact. Paraspinal and other soft tissues: Partially visible urinary bladder distension. Aortoiliac calcified atherosclerosis noted. Negative visualized noncontrast abdominal viscera. No paraspinal hematoma. Disc levels: Lumbar disc bulging and endplate spurring Q7-Y1 through L4-L5. Mild to moderate multifactorial lumbar spinal stenosis at L3-L4 and L4-L5 where there is superimposed moderate facet and ligament  flavum hypertrophy. IMPRESSION: 1. Acute, comminuted Comminuted T12 vertebral body compression fracture with about 33% loss of vertebral body height. Mild retropulsion of the posterior superior endplate without associated spinal stenosis. No other adverse features. 2.  No acute fracture in the lumbar spine. 3. Multifactorial degenerative lumbar mild-to-moderate spinal stenosis at L3-L4 and L4-L5. Electronically Signed   By: Genevie Ann M.D.   On: 07/19/2016 20:20    ROSNO RECENT ILLNESSES OR HOSPITALIZATIONS  There were no vitals taken for this visit. Physical Exam  General Appearance:  Alert, cooperative, no distress, appears stated age  Head:  Normocephalic, without obvious abnormality, atraumatic  Eyes:  Pupils  equal, conjunctiva/corneas clear,         Throat: Lips, mucosa, and tongue normal; teeth and gums normal  Neck: No visible masses     Lungs:   respirations unlabored  Chest Wall:  No tenderness or deformity  Heart:  Regular rate and rhythm,  Abdomen:   Soft, non-tender,         Extremities: RUE: SPLINT IN PLACE ABLE TO EXTEND THUMB WIGGLES FINGERS FINGERS WARM WELL PERFUSED  Pulses: 2+ and symmetric  Skin: Skin color, texture, turgor normal, no rashes or lesions     Neurologic: Normal    Assessment/Plan RIGHT COMMINUTED INTRA-ARTICULAR DISPLACED DISTAL RADIUS FRACTURE  RIGHT DISTAL RADIUS OPEN REDUCTION AND INTERNAL FIXATION AND REPAIR AS INDICATED  R/B/A DISCUSSED WITH PT IN HOSPITAL.  PT VOICED UNDERSTANDING OF PLAN CONSENT SIGNED DAY OF SURGERY PT SEEN AND EXAMINED PRIOR TO OPERATIVE PROCEDURE/DAY OF SURGERY SITE MARKED. QUESTIONS ANSWERED WILL GO HOME FOLLOWING SURGERY  WE ARE PLANNING SURGERY FOR YOUR UPPER EXTREMITY. THE RISKS AND BENEFITS OF SURGERY INCLUDE BUT NOT LIMITED TO BLEEDING INFECTION, DAMAGE TO NEARBY NERVES ARTERIES TENDONS, FAILURE OF SURGERY TO ACCOMPLISH ITS INTENDED GOALS, PERSISTENT SYMPTOMS AND NEED FOR FURTHER SURGICAL INTERVENTION. WITH THIS  IN MIND WE WILL PROCEED. I HAVE DISCUSSED WITH THE PATIENT THE PRE AND POSTOPERATIVE REGIMEN AND THE DOS AND DON'TS. PT VOICED UNDERSTANDING AND INFORMED CONSENT SIGNED.  Linna Hoff 07/20/2016, 9:32 AM

## 2016-07-20 NOTE — Progress Notes (Signed)
Informed patient several times that he cannot stay by himself after surgery for at least 24 hours. Patient is insistent that he can and has cared for himself immediately after anesthesia and reports that he even went back to work later in the day for surgery. Explained to patient that legally we are unable to send him home without knowing someone is going to be there to care for him. Patient again verbalized above. Dr. Michelle Piperssey and Dr. Melvyn Novasrtmann have been notified of same.

## 2016-07-20 NOTE — Transfer of Care (Signed)
Immediate Anesthesia Transfer of Care Note  Patient: Omar Sandoval  Procedure(s) Performed: Procedure(s): OPEN REDUCTION INTERNAL FIXATION (ORIF) DISTAL RADIAL FRACTURE (Right)  Patient Location: PACU  Anesthesia Type:General and Regional  Level of Consciousness: awake, alert , oriented and patient cooperative  Airway & Oxygen Therapy: Patient Spontanous Breathing and Patient connected to nasal cannula oxygen  Post-op Assessment: Report given to RN and Post -op Vital signs reviewed and stable  Post vital signs: Reviewed and stable  Last Vitals:  Vitals:   07/20/16 1012  BP: (!) 158/95  Pulse: 75  Resp: 16  Temp: 37.3 C    Last Pain:  Vitals:   07/20/16 1012  TempSrc: Oral  PainSc:       Patients Stated Pain Goal: 6 (07/20/16 1007)  Complications: No apparent anesthesia complications

## 2016-07-20 NOTE — Anesthesia Postprocedure Evaluation (Signed)
Anesthesia Post Note  Patient: Omar Sandoval  Procedure(s) Performed: Procedure(s) (LRB): OPEN REDUCTION INTERNAL FIXATION (ORIF) DISTAL RADIAL FRACTURE (Right)  Patient location during evaluation: PACU Anesthesia Type: Regional Level of consciousness: awake and alert and patient cooperative Pain management: pain level controlled Vital Signs Assessment: post-procedure vital signs reviewed and stable Respiratory status: spontaneous breathing and respiratory function stable Cardiovascular status: stable Anesthetic complications: no    Last Vitals:  Vitals:   07/20/16 1300 07/20/16 1315  BP: (!) 146/94 (!) 134/97  Pulse:    Resp: (!) 22   Temp:      Last Pain:  Vitals:   07/20/16 1245  TempSrc:   PainSc: 0-No pain                 Tupac Jeffus DAVID

## 2016-07-20 NOTE — Anesthesia Procedure Notes (Addendum)
Anesthesia Regional Block:  Supraclavicular block  Pre-Anesthetic Checklist: ,, timeout performed, Correct Patient, Correct Site, Correct Laterality, Correct Procedure, Correct Position, site marked, Risks and benefits discussed,  Surgical consent,  Pre-op evaluation,  At surgeon's request and post-op pain management  Laterality: Right  Prep: chloraprep       Needles:   Needle Type: Echogenic Stimulator Needle     Needle Length: 9cm 9 cm Needle Gauge: 21 and 21 G    Additional Needles:  Procedures: ultrasound guided (picture in chart) and nerve stimulator Supraclavicular block  Nerve Stimulator or Paresthesia:  Response: 0.4 mA,   Additional Responses:   Narrative:  Start time: 07/20/2016 10:50 AM End time: 07/20/2016 11:00 AM Injection made incrementally with aspirations every 5 mL.  Performed by: Personally  Anesthesiologist: Arta BruceSSEY, Elienai Gailey  Additional Notes: Monitors applied. Patient sedated. Sterile prep and drape,hand hygiene and sterile gloves were used. Relevant anatomy identified.Needle position confirmed.Local anesthetic injected incrementally after negative aspiration. Local anesthetic spread visualized around nerve(s). Vascular puncture avoided. No complications. Image printed for medical record.The patient tolerated the procedure well.

## 2016-07-20 NOTE — OR Nursing (Signed)
Discharge instructions were reviews with pt and pt niece. We discussed wound care, regional block, medications and discharge instructions that were printed out for pt. Pt knows to follow up with MD in 2 weeks and stated that his significant other would be with him over night and his niece would be riding with him in a cab and staying with him until significant other arrived. Pt and family denied any questions regarding discharge instructions and stated that he was ready to leave. Pt was able to tolerate fluids well and denied any pain at this time.

## 2016-07-20 NOTE — Discharge Instructions (Signed)
KEEP BANDAGE CLEAN AND DRY °CALL OFFICE FOR F/U APPT [phone] in 14 days °KEEP HAND ELEVATED ABOVE HEART °OK TO APPLY ICE TO OPERATIVE AREA °CONTACT OFFICE IF ANY WORSENING PAIN OR CONCERNS. °

## 2016-07-20 NOTE — Anesthesia Preprocedure Evaluation (Signed)
Anesthesia Evaluation  Patient identified by MRN, date of birth, ID band Patient awake    Reviewed: Allergy & Precautions, NPO status , Patient's Chart, lab work & pertinent test results  Airway Mallampati: I  TM Distance: >3 FB Neck ROM: Full    Dental   Pulmonary former smoker,    Pulmonary exam normal        Cardiovascular Normal cardiovascular exam     Neuro/Psych    GI/Hepatic   Endo/Other    Renal/GU      Musculoskeletal   Abdominal   Peds  Hematology   Anesthesia Other Findings   Reproductive/Obstetrics                             Anesthesia Physical Anesthesia Plan  ASA: II  Anesthesia Plan: Regional and MAC   Post-op Pain Management:    Induction: Intravenous  Airway Management Planned: Simple Face Mask  Additional Equipment:   Intra-op Plan:   Post-operative Plan:   Informed Consent: I have reviewed the patients History and Physical, chart, labs and discussed the procedure including the risks, benefits and alternatives for the proposed anesthesia with the patient or authorized representative who has indicated his/her understanding and acceptance.     Plan Discussed with: CRNA and Surgeon  Anesthesia Plan Comments:         Anesthesia Quick Evaluation

## 2016-07-22 ENCOUNTER — Encounter (HOSPITAL_COMMUNITY): Payer: Self-pay | Admitting: Orthopedic Surgery

## 2016-07-22 NOTE — Op Note (Signed)
NAMBaird Kay:  Sandoval, Omar                  ACCOUNT NO.:  [id number]  MEDICAL RECORD NO.:  [id number]  LOCATION:                                 FACILITY:  PHYSICIAN:  Sharma CovertFred W. Zanayah Shadowens IV, M.D.DATE OF BIRTH:  1955-09-04  DATE OF PROCEDURE:  07/20/2016 DATE OF DISCHARGE:                              OPERATIVE REPORT   PREOPERATIVE DIAGNOSIS:  Right wrist intra-articular distal radius fracture, 3 or more fragments.  POSTOPERATIVE DIAGNOSIS:  Right wrist intra-articular distal radius fracture, 3 or more fragments.  ATTENDING PHYSICIAN:  Sharma CovertFred W. Brightyn Mozer, M.D. was scrubbed and was present for the entire procedure.  ASSISTANT SURGEON:  None.  ANESTHESIA:  Supraclavicular block with IV sedation.  PROCEDURE: 1. Open treatment of right wrist intra-articular distal radius     fracture of 3 or more fragments requiring internal fixation. 2. Right wrist brachioradialis tenotomy, forearm flexor. 3. Radiographs, 3 views, right wrist.  SURGICAL IMPLANTS:  Standard DVR Crosslock.  RADIOGRAPHIC INTERPRETATION:  AP, lateral, and oblique views of the wrist did show the volar plate fixation in place with good alignment in both planes.  SURGICAL INDICATIONS:  [name] is a right-hand-dominant gentleman, who sustained a closed injury to the right distal radius.  The patient has a persistent displacement despite closed reduction.  Risks, benefits, and alternatives were discussed in detail with the patient.  A signed informed consent was obtained.  Risks include, but are not limited to bleeding, infection, damage to nearby nerves, arteries, or tendons, nonunion, malunion, hardware failure, loss and need for further surgical intervention.  DESCRIPTION OF THE PROCEDURE:  The patient was properly identified in the preoperative holding area, marked with a permanent marker made on the right wrist to indicate the correct operative site.  The patient was then brought back to the operating room, placed supine  on the anesthesia table.  IV sedation was administered.  The patient tolerated this well. A well-padded tourniquet placed on right brachium, sealed with 1000 drape.  Right upper extremity was then prepped and draped in normal sterile fashion.  Time-out was called, correct side was identified, and procedure was then begun.  Attention was then turned to the right wrist. Longitudinal incision was made directly over the FCR sheath.  Dissection was carried down through the skin and subcutaneous tissue.  The FCR sheath was then opened.  Going through the floor of the FCR sheath, the pronator quadratus was then elevated after retraction of the FPL.  An L- shaped pronator quadratus flap was then carried out.  The patient did have a highly-comminuted intra-articular fracture with 3 or more fragments, in order to reduce the radial column and reduce the fracture. Brachioradialis was then carefully elevated and forearm tenotomy was then carried out of the forearm flexor.  The wound was then thoroughly irrigated.  Fracture hematoma was then evacuated.  The patient did have the metaphyseal defect and 2 mL of Biomet StaGraft was then packed into this at the metaphyseal defect.  Once this was carried out, the volar plate was then applied and held in place distally with a K-wire and then the oblong screw hole was then placed proximally.  Plate height was  then adjusted.  Following it, distal fixation was carried out from an ulnar to radial direction with the combination of locking pegs and screws, 1 multidirectional screw.  The final fixation was then carried out proximally with a combination of locking and nonlocking screws in the shaft.  The wound was irrigated.  The pronator quadratus was closed with 2-0 Vicryl, subcutaneous tissue was closed with 4-0 Vicryl, and the skin closed with 4-0 Prolene.  Adaptic dressing, sterile compressive bandage was applied.  Stress examination of the distal radioulnar  joint and SL interval was completed.  There was not any gross instability.  Following this, the patient was then placed in a well-padded sugar-tong splint, taken to recovery room in good condition.  POSTPROCEDURE PLAN:  The patient will be discharged to home and seen back in the office in approximately 2 weeks for wound check, suture removal, x-rays out of the sugar-tong splint, application of short-arm cast, total immobilization for 4 weeks, and then begin an outpatient therapy regimen at the 4-week mark, radiographs at each visit.     Madelynn DoneFred W. Saburo Luger IV, M.D.   ______________________________ Madelynn DoneFred W. Britnie Colville IV, M.D.    FWO/MEDQ  D:  07/20/2016  T:  07/20/2016  Job:  [id number]

## 2016-08-31 ENCOUNTER — Emergency Department (HOSPITAL_COMMUNITY): Payer: Medicare HMO

## 2016-08-31 ENCOUNTER — Emergency Department (HOSPITAL_COMMUNITY)
Admission: EM | Admit: 2016-08-31 | Discharge: 2016-08-31 | Disposition: A | Discharge status: Home / Self Care | Payer: Medicare HMO | Attending: Emergency Medicine | Admitting: Emergency Medicine

## 2016-08-31 ENCOUNTER — Encounter (HOSPITAL_COMMUNITY): Payer: Self-pay

## 2016-08-31 DIAGNOSIS — Z21 Asymptomatic human immunodeficiency virus [HIV] infection status: Secondary | ICD-10-CM | POA: Diagnosis not present

## 2016-08-31 DIAGNOSIS — Z79899 Other long term (current) drug therapy: Secondary | ICD-10-CM | POA: Diagnosis not present

## 2016-08-31 DIAGNOSIS — Z87891 Personal history of nicotine dependence: Secondary | ICD-10-CM | POA: Insufficient documentation

## 2016-08-31 DIAGNOSIS — R0789 Other chest pain: Secondary | ICD-10-CM

## 2016-08-31 LAB — CBC
HEMATOCRIT: 40.4 % (ref 39.0–52.0)
HEMOGLOBIN: 14.1 g/dL (ref 13.0–17.0)
MCH: 35.2 pg — ABNORMAL HIGH (ref 26.0–34.0)
MCHC: 34.9 g/dL (ref 30.0–36.0)
MCV: 100.7 fL — ABNORMAL HIGH (ref 78.0–100.0)
Platelets: 322 10*3/uL (ref 150–400)
RBC: 4.01 MIL/uL — ABNORMAL LOW (ref 4.22–5.81)
RDW: 13.9 % (ref 11.5–15.5)
WBC: 5.9 10*3/uL (ref 4.0–10.5)

## 2016-08-31 LAB — I-STAT TROPONIN, ED
Troponin i, poc: 0.01 ng/mL (ref 0.00–0.08)
Troponin i, poc: 0 ng/mL (ref 0.00–0.08)

## 2016-08-31 LAB — COMPREHENSIVE METABOLIC PANEL
ALBUMIN: 3.9 g/dL (ref 3.5–5.0)
ALT: 23 U/L (ref 17–63)
AST: 25 U/L (ref 15–41)
Alkaline Phosphatase: 99 U/L (ref 38–126)
Anion gap: 13 (ref 5–15)
BILIRUBIN TOTAL: 0.9 mg/dL (ref 0.3–1.2)
BUN: 7 mg/dL (ref 6–20)
CO2: 24 mmol/L (ref 22–32)
Calcium: 9.1 mg/dL (ref 8.9–10.3)
Chloride: 101 mmol/L (ref 101–111)
Creatinine, Ser: 0.64 mg/dL (ref 0.61–1.24)
GFR calc Af Amer: >60 mL/min (ref >60)
GFR calc non Af Amer: >60 mL/min (ref >60)
GLUCOSE: 76 mg/dL (ref 65–99)
Potassium: 3.8 mmol/L (ref 3.5–5.1)
SODIUM: 138 mmol/L (ref 135–145)
TOTAL PROTEIN: 7.4 g/dL (ref 6.5–8.1)

## 2016-08-31 MED ORDER — IBUPROFEN 400 MG PO TABS
400.0000 mg | ORAL_TABLET | Freq: Once | ORAL | Status: AC
  Administered 2016-08-31: 400 mg via ORAL
  Filled 2016-08-31: qty 1

## 2016-08-31 MED ORDER — OXYCODONE-ACETAMINOPHEN 5-325 MG PO TABS
1.0000 | ORAL_TABLET | Freq: Once | ORAL | Status: AC
  Administered 2016-08-31: 1 via ORAL
  Filled 2016-08-31: qty 1

## 2016-08-31 MED ORDER — TRAMADOL HCL 50 MG PO TABS: 50.0000 mg | ORAL_TABLET | Freq: Four times a day (QID) | ORAL | 0 refills | Status: DC | PRN

## 2016-08-31 NOTE — ED Notes (Signed)
ED Provider at bedside. 

## 2016-08-31 NOTE — Discharge Instructions (Signed)
It was our pleasure to provide your ER care today - we hope that you feel better.  Take motrin or aleve as need for pain.  You may also take ultram as need for pain - no driving when taking.  Follow up with primary care doctor in the coming week for recheck.  Return to ER if worse, new symptoms, fevers, trouble breathing, other concern.   You were given pain medication in the ER - no driving for the next 4 hours.

## 2016-08-31 NOTE — ED Provider Notes (Signed)
[facility]-EMERGENCY DEPT Provider Note   CSN: [id number] Arrival date & time: 08/31/16  1416     History   Chief Complaint Chief Complaint  Patient presents with  . Chest Pain    HPI Omar Sandoval is a 61 y.o. male.  Patient c/o mid to left chest pain since this AM.  Onset at rest. Pain constant, sharp, non radiating, worse w change in position or turning, palpation chest wall, and non prod cough.  Episodic, mild non productive cough. No sore throat, runny nose, or other uri c/o. No fever or chills. Symptoms at rest. No change w activity or exertion. Not pleuritic. No hemoptysis. No leg pain or swelling. No recent surgery, immobility, trauma or travel. No hx dvt or pe. No hx cad or fam hx premature cad.   Denies chest wall injury or strain. No heartburn. No exertional cp or discomfort. No unusual fatigue or doe.       The history is provided by the patient.  Chest Pain   Associated symptoms include cough. Pertinent negatives include no abdominal pain, no back pain, no fever, no headaches, no nausea, no shortness of breath and no vomiting.    Past Medical History:  Diagnosis Date  . HIV (human immunodeficiency virus infection) (HCC)   . Immune deficiency disorder [facility](HCC)     Patient Active Problem List   Diagnosis Date Noted  . HIV disease (HCC) 01/06/2013  . 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 COLONOSCOPY    . FRACTURE SURGERY     left ankle and right foot  . HERNIA REPAIR    . OPEN REDUCTION INTERNAL FIXATION (ORIF) DISTAL RADIAL FRACTURE Right 07/20/2016   Procedure: OPEN REDUCTION INTERNAL FIXATION (ORIF) DISTAL RADIAL FRACTURE;  Surgeon: Bradly BienenstockFred Ortmann, MD;  Location: [facility] OR;  Service: Orthopedics;  Laterality: Right;  . SURGERY SCROTAL / TESTICULAR     nondecended testes       Home Medications    Prior to Admission medications   Medication Sig Start Date End Date Taking? Authorizing Provider    Abacavir-Dolutegravir-Lamivud (TRIUMEQ) 600-50-300 MG TABS Take 1 tablet by mouth at bedtime.  03/03/14   Historical Provider, MD  benzonatate (TESSALON) 100 MG capsule Take 1 capsule (100 mg total) by mouth every 8 (eight) hours. Patient not taking: Reported on 07/20/2016 08/26/15   Ace GinsSerena Y Sam, PA-C  Cyanocobalamin (VITAMIN B-12 IJ) Inject 1 Units as directed every 30 (thirty) days.    Historical Provider, MD  docusate sodium (COLACE) 100 MG capsule Take 1 capsule (100 mg total) by mouth 2 (two) times daily. 07/20/16   Bradly BienenstockFred Ortmann, MD  ibuprofen (ADVIL,MOTRIN) 800 MG tablet Take 1 tablet (800 mg total) by mouth 3 (three) times daily. Patient not taking: Reported on 07/20/2016 08/26/15   Ace GinsSerena Y Sam, PA-C  methocarbamol (ROBAXIN) 500 MG tablet Take 1 tablet (500 mg total) by mouth 4 (four) times daily. 07/20/16   Bradly BienenstockFred Ortmann, MD  oxyCODONE-acetaminophen (PERCOCET) 10-325 MG per tablet Take 0.5-1 tablets by mouth every 4 (four) hours as needed for pain. Patient taking differently: Take 1 tablet by mouth every 4 (four) hours as needed for pain.  06/21/13   Arthor CaptainAbigail Harris, PA-C  vitamin C (ASCORBIC ACID) 500 MG tablet Take 1 tablet (500 mg total) by mouth daily. 07/20/16   Bradly BienenstockFred Ortmann, MD    Family History Family History  Problem Relation Age of Onset  . Cancer Mother  lung  . Cancer Brother     lung, liver, and colon    Social History Social History  Substance Use Topics  . Smoking status: Former Smoker    Packs/day: 0.00    Types: Cigarettes  . Smokeless tobacco: Never Used  . Alcohol use 1.8 oz/week    3 Cans of beer per week     Comment: beer daily     Allergies   Vicodin [hydrocodone-acetaminophen]   Review of Systems Review of Systems  Constitutional: Negative for chills and fever.  HENT: Negative for sore throat.   Eyes: Negative for redness.  Respiratory: Positive for cough. Negative for shortness of breath.   Cardiovascular: Positive for chest pain.  Negative for leg swelling.  Gastrointestinal: Negative for abdominal pain, nausea and vomiting.  Genitourinary: Negative for flank pain.  Musculoskeletal: Negative for back pain and neck pain.  Skin: Negative for rash.  Neurological: Negative for headaches.  Hematological: Does not bruise/bleed easily.  Psychiatric/Behavioral: Negative for confusion.     Physical Exam Updated Vital Signs BP 121/83   Pulse 70   Temp 98.5 F (36.9 C) (Oral)   Resp 22   Ht 5\' 9"  (1.753 m)   Wt 88.5 kg   SpO2 95%   BMI 28.80 kg/m   Physical Exam  Constitutional: He appears well-developed and well-nourished. No distress.  HENT:  Mouth/Throat: Oropharynx is clear and moist.  Eyes: Conjunctivae are normal.  Neck: Neck supple. No tracheal deviation present.  Cardiovascular: Normal rate, regular rhythm, normal heart sounds and intact distal pulses.  Exam reveals no gallop and no friction rub.   No murmur heard. Pulmonary/Chest: Effort normal and breath sounds normal. No accessory muscle usage. No respiratory distress. He exhibits tenderness.  +chest wall tenderness reproducing symptoms.   Abdominal: Soft. Bowel sounds are normal. He exhibits no distension. There is no tenderness.  Genitourinary:  Genitourinary Comments: No cva tenderness  Musculoskeletal: He exhibits no edema or tenderness.  Neurological: He is alert.  Skin: Skin is warm and dry. He is not diaphoretic.  Psychiatric: He has a normal mood and affect.  Nursing note and vitals reviewed.    ED Treatments / Results  Labs (all labs ordered are listed, but only abnormal results are displayed) Results for orders placed or performed during the hospital encounter of 08/31/16  CBC  Result Value Ref Range   WBC 5.9 4.0 - 10.5 K/uL   RBC 4.01 (L) 4.22 - 5.81 MIL/uL   Hemoglobin 14.1 13.0 - 17.0 g/dL   HCT 16.1 09.6 - 04.5 %   MCV 100.7 (H) 78.0 - 100.0 fL   MCH 35.2 (H) 26.0 - 34.0 pg   MCHC 34.9 30.0 - 36.0 g/dL   RDW 40.9 81.1 -  91.4 %   Platelets 322 150 - 400 K/uL  Comprehensive metabolic panel  Result Value Ref Range   Sodium 138 135 - 145 mmol/L   Potassium 3.8 3.5 - 5.1 mmol/L   Chloride 101 101 - 111 mmol/L   CO2 24 22 - 32 mmol/L   Glucose, Bld 76 65 - 99 mg/dL   BUN 7 6 - 20 mg/dL   Creatinine, Ser 7.82 0.61 - 1.24 mg/dL   Calcium 9.1 8.9 - 95.6 mg/dL   Total Protein 7.4 6.5 - 8.1 g/dL   Albumin 3.9 3.5 - 5.0 g/dL   AST 25 15 - 41 U/L   ALT 23 17 - 63 U/L   Alkaline Phosphatase 99 38 - 126 U/L  Total Bilirubin 0.9 0.3 - 1.2 mg/dL   GFR calc non Af Amer >60 >60 mL/min   GFR calc Af Amer >60 >60 mL/min   Anion gap 13 5 - 15  I-stat troponin, ED (not at [facility], [facility])  Result Value Ref Range   Troponin i, poc 0.01 0.00 - 0.08 ng/mL   Comment 3           Dg Chest 2 View  Result Date: 08/31/2016 CLINICAL DATA:  Left-sided chest pain and shortness of breath since last night. EXAM: CHEST  2 VIEW COMPARISON:  07/19/2016 FINDINGS: Minimal basilar atelectasis, stable to improved from prior. No superimposed air bronchogram or Kerley line. No pleural effusion or pneumothorax. Normal heart size and stable aortic tortuosity. Subacute/remote T12 compression fracture, known from 07/19/2016 lumbar spine CT. Height loss increased since CT and is now moderate range. IMPRESSION: Minimal basilar atelectasis. Electronically Signed   By: Marnee Spring M.D.   On: 08/31/2016 15:30    EKG  EKG Interpretation  Date/Time:  Saturday August 31 2016 14:31:21 EST Ventricular Rate:  76 PR Interval:  146 QRS Duration: 126 QT Interval:  382 QTC Calculation: 429 R Axis:   6 Text Interpretation:  Normal sinus rhythm Right bundle branch block Confirmed by Denton Lank  MD, Caryn Bee ([id number]) on 08/31/2016 6:16:45 PM       Radiology Dg Chest 2 View  Result Date: 08/31/2016 CLINICAL DATA:  Left-sided chest pain and shortness of breath since last night. EXAM: CHEST  2 VIEW COMPARISON:  07/19/2016 FINDINGS: Minimal basilar atelectasis,  stable to improved from prior. No superimposed air bronchogram or Kerley line. No pleural effusion or pneumothorax. Normal heart size and stable aortic tortuosity. Subacute/remote T12 compression fracture, known from 07/19/2016 lumbar spine CT. Height loss increased since CT and is now moderate range. IMPRESSION: Minimal basilar atelectasis. Electronically Signed   By: Marnee Spring M.D.   On: 08/31/2016 15:30    Procedures Procedures (including critical care time)  Medications Ordered in ED Medications  oxyCODONE-acetaminophen (PERCOCET/ROXICET) 5-325 MG per tablet 1 tablet (1 tablet Oral Given 08/31/16 1842)  ibuprofen (ADVIL,MOTRIN) tablet 400 mg (400 mg Oral Given 08/31/16 1842)     Initial Impression / Assessment and Plan / ED Course  I have reviewed the triage vital signs and the nursing notes.  Pertinent labs & imaging results that were available during my care of the patient were reviewed by me and considered in my medical decision making (see chart for details).  Clinical Course     Reviewed nursing notes and prior charts for additional history.   Patients symptoms and exam appear most c/w musculoskeletal pain.   After symptoms since early AM, initial and repeat trop neg.   Percocet 1 po, motrin po.  Patient currently appears comfortable, in no distress, and appears stable for d/c.     Final Clinical Impressions(s) / ED Diagnoses   Final diagnoses:  None    New Prescriptions New Prescriptions   No medications on file     Cathren Laine, MD 08/31/16 1857

## 2016-08-31 NOTE — ED Triage Notes (Signed)
Pt presents with onset of L sided chest pain that began last night.  Pt reports pain has been constant, worsens with movement and deep inspiration.  Pain does not radiate.  +shortness of breath, dry cough

## 2016-10-18 ENCOUNTER — Other Ambulatory Visit: Payer: Self-pay | Admitting: Otolaryngology

## 2016-10-18 DIAGNOSIS — M542 Cervicalgia: Secondary | ICD-10-CM

## 2016-10-22 ENCOUNTER — Ambulatory Visit
Admission: RE | Admit: 2016-10-22 | Discharge: 2016-10-22 | Disposition: A | Discharge status: Home / Self Care | Payer: Medicare HMO | Source: Ambulatory Visit | Attending: Otolaryngology | Admitting: Otolaryngology

## 2016-10-22 DIAGNOSIS — M542 Cervicalgia: Secondary | ICD-10-CM

## 2016-10-22 MED ORDER — IOPAMIDOL (ISOVUE-300) INJECTION 61%
75.0000 mL | Freq: Once | INTRAVENOUS | Status: AC | PRN
  Administered 2016-10-22: 75 mL via INTRAVENOUS

## 2017-02-03 ENCOUNTER — Emergency Department (HOSPITAL_COMMUNITY): Payer: Medicare HMO

## 2017-02-03 ENCOUNTER — Inpatient Hospital Stay (HOSPITAL_COMMUNITY)
Admission: EM | Admit: 2017-02-03 | Discharge: 2017-02-06 | DRG: 247 | Disposition: A | Discharge status: Home / Self Care | Payer: Medicare HMO | Attending: Interventional Cardiology | Admitting: Interventional Cardiology

## 2017-02-03 ENCOUNTER — Inpatient Hospital Stay (HOSPITAL_COMMUNITY)
Admission: EM | Disposition: A | Discharge status: Home / Self Care | Payer: Self-pay | Source: Home / Self Care | Attending: Interventional Cardiology

## 2017-02-03 ENCOUNTER — Inpatient Hospital Stay (HOSPITAL_COMMUNITY): Payer: Medicare HMO

## 2017-02-03 ENCOUNTER — Encounter (HOSPITAL_COMMUNITY): Payer: Self-pay | Admitting: *Deleted

## 2017-02-03 DIAGNOSIS — B20 Human immunodeficiency virus [HIV] disease: Secondary | ICD-10-CM

## 2017-02-03 DIAGNOSIS — E785 Hyperlipidemia, unspecified: Secondary | ICD-10-CM | POA: Diagnosis present

## 2017-02-03 DIAGNOSIS — Z21 Asymptomatic human immunodeficiency virus [HIV] infection status: Secondary | ICD-10-CM | POA: Diagnosis present

## 2017-02-03 DIAGNOSIS — M549 Dorsalgia, unspecified: Secondary | ICD-10-CM | POA: Diagnosis present

## 2017-02-03 DIAGNOSIS — Z87891 Personal history of nicotine dependence: Secondary | ICD-10-CM

## 2017-02-03 DIAGNOSIS — R0789 Other chest pain: Secondary | ICD-10-CM | POA: Diagnosis present

## 2017-02-03 DIAGNOSIS — D7589 Other specified diseases of blood and blood-forming organs: Secondary | ICD-10-CM | POA: Diagnosis present

## 2017-02-03 DIAGNOSIS — I2510 Atherosclerotic heart disease of native coronary artery without angina pectoris: Secondary | ICD-10-CM | POA: Diagnosis present

## 2017-02-03 DIAGNOSIS — Z79899 Other long term (current) drug therapy: Secondary | ICD-10-CM | POA: Diagnosis not present

## 2017-02-03 DIAGNOSIS — Z6828 Body mass index (BMI) 28.0-28.9, adult: Secondary | ICD-10-CM | POA: Diagnosis not present

## 2017-02-03 DIAGNOSIS — Z955 Presence of coronary angioplasty implant and graft: Secondary | ICD-10-CM

## 2017-02-03 DIAGNOSIS — I952 Hypotension due to drugs: Secondary | ICD-10-CM | POA: Diagnosis not present

## 2017-02-03 DIAGNOSIS — I360 Nonrheumatic tricuspid (valve) stenosis: Secondary | ICD-10-CM | POA: Diagnosis not present

## 2017-02-03 DIAGNOSIS — E669 Obesity, unspecified: Secondary | ICD-10-CM | POA: Diagnosis present

## 2017-02-03 DIAGNOSIS — I340 Nonrheumatic mitral (valve) insufficiency: Secondary | ICD-10-CM | POA: Diagnosis not present

## 2017-02-03 DIAGNOSIS — I252 Old myocardial infarction: Secondary | ICD-10-CM | POA: Insufficient documentation

## 2017-02-03 DIAGNOSIS — G8929 Other chronic pain: Secondary | ICD-10-CM | POA: Diagnosis present

## 2017-02-03 DIAGNOSIS — I2111 ST elevation (STEMI) myocardial infarction involving right coronary artery: Secondary | ICD-10-CM

## 2017-02-03 DIAGNOSIS — I25119 Atherosclerotic heart disease of native coronary artery with unspecified angina pectoris: Secondary | ICD-10-CM

## 2017-02-03 DIAGNOSIS — I2119 ST elevation (STEMI) myocardial infarction involving other coronary artery of inferior wall: Secondary | ICD-10-CM | POA: Diagnosis present

## 2017-02-03 HISTORY — PX: LEFT HEART CATH AND CORONARY ANGIOGRAPHY: CATH118249

## 2017-02-03 HISTORY — PX: CORONARY/GRAFT ACUTE MI REVASCULARIZATION: CATH118305

## 2017-02-03 LAB — LIPID PANEL
CHOL/HDL RATIO: 3.3 ratio
CHOLESTEROL: 156 mg/dL (ref 0–200)
HDL: 47 mg/dL (ref >40)
LDL Cholesterol: 101 mg/dL — ABNORMAL HIGH (ref 0–99)
TRIGLYCERIDES: 39 mg/dL (ref <150)
VLDL: 8 mg/dL (ref 0–40)

## 2017-02-03 LAB — PROTIME-INR
INR: 0.96
Prothrombin Time: 12.8 s (ref 11.4–15.2)

## 2017-02-03 LAB — CBC
HCT: 41.7 % (ref 39.0–52.0)
HEMATOCRIT: 38.9 % — AB (ref 39.0–52.0)
Hemoglobin: 13.8 g/dL (ref 13.0–17.0)
Hemoglobin: 13.3 g/dL (ref 13.0–17.0)
MCH: 34.1 pg — ABNORMAL HIGH (ref 26.0–34.0)
MCH: 34.7 pg — AB (ref 26.0–34.0)
MCHC: 33.1 g/dL (ref 30.0–36.0)
MCHC: 34.2 g/dL (ref 30.0–36.0)
MCV: 103 fL — AB (ref 78.0–100.0)
MCV: 101.6 fL — AB (ref 78.0–100.0)
PLATELETS: 323 10*3/uL (ref 150–400)
Platelets: 306 10*3/uL (ref 150–400)
RBC: 4.05 MIL/uL — ABNORMAL LOW (ref 4.22–5.81)
RBC: 3.83 MIL/uL — AB (ref 4.22–5.81)
RDW: 13.8 % (ref 11.5–15.5)
RDW: 13.5 % (ref 11.5–15.5)
WBC: 6.3 10*3/uL (ref 4.0–10.5)
WBC: 8.5 10*3/uL (ref 4.0–10.5)

## 2017-02-03 LAB — BASIC METABOLIC PANEL
Anion gap: 8 (ref 5–15)
BUN: 5 mg/dL — AB (ref 6–20)
CO2: 28 mmol/L (ref 22–32)
Calcium: 9 mg/dL (ref 8.9–10.3)
Chloride: 99 mmol/L — ABNORMAL LOW (ref 101–111)
Creatinine, Ser: 0.68 mg/dL (ref 0.61–1.24)
GFR calc Af Amer: >60 mL/min (ref >60)
GLUCOSE: 177 mg/dL — AB (ref 65–99)
Potassium: 3.6 mmol/L (ref 3.5–5.1)
Sodium: 135 mmol/L (ref 135–145)

## 2017-02-03 LAB — POCT I-STAT, CHEM 8
BUN: 4 mg/dL — ABNORMAL LOW (ref 6–20)
CHLORIDE: 96 mmol/L — AB (ref 101–111)
Calcium, Ion: 1.22 mmol/L (ref 1.15–1.40)
Creatinine, Ser: 0.5 mg/dL — ABNORMAL LOW (ref 0.61–1.24)
Glucose, Bld: 164 mg/dL — ABNORMAL HIGH (ref 65–99)
HEMATOCRIT: 37 % — AB (ref 39.0–52.0)
HEMOGLOBIN: 12.6 g/dL — AB (ref 13.0–17.0)
POTASSIUM: 3.4 mmol/L — AB (ref 3.5–5.1)
SODIUM: 135 mmol/L (ref 135–145)
TCO2: 29 mmol/L (ref 0–100)

## 2017-02-03 LAB — MRSA PCR SCREENING: MRSA BY PCR: NEGATIVE

## 2017-02-03 LAB — BRAIN NATRIURETIC PEPTIDE: B NATRIURETIC PEPTIDE 5: 83.2 pg/mL (ref 0.0–100.0)

## 2017-02-03 LAB — CREATININE, SERUM
Creatinine, Ser: 0.69 mg/dL (ref 0.61–1.24)
GFR calc non Af Amer: >60 mL/min (ref >60)

## 2017-02-03 LAB — POCT ACTIVATED CLOTTING TIME
ACTIVATED CLOTTING TIME: 318 s
Activated Clotting Time: 241 seconds
Activated Clotting Time: 268 seconds

## 2017-02-03 LAB — POCT I-STAT TROPONIN I: Troponin i, poc: 9.21 ng/mL (ref 0.00–0.08)

## 2017-02-03 LAB — TROPONIN I
Troponin I: >65 ng/mL (ref <0.03)
Troponin I: >65 ng/mL (ref <0.03)

## 2017-02-03 LAB — TSH: TSH: 1.268 u[IU]/mL (ref 0.350–4.500)

## 2017-02-03 SURGERY — LEFT HEART CATH AND CORONARY ANGIOGRAPHY
Anesthesia: LOCAL

## 2017-02-03 MED ORDER — ATROPINE SULFATE 1 MG/10ML IJ SOSY
PREFILLED_SYRINGE | INTRAMUSCULAR | Status: DC | PRN
  Administered 2017-02-03: .6 mg via INTRAVENOUS

## 2017-02-03 MED ORDER — TICAGRELOR 90 MG PO TABS
ORAL_TABLET | ORAL | Status: DC | PRN
  Administered 2017-02-03: 180 mg via ORAL

## 2017-02-03 MED ORDER — LIDOCAINE HCL (PF) 1 % IJ SOLN
INTRAMUSCULAR | Status: DC | PRN
  Administered 2017-02-03: 2 mL

## 2017-02-03 MED ORDER — SODIUM CHLORIDE 0.9 % IV SOLN
INTRAVENOUS | Status: DC
  Administered 2017-02-03: 10:00:00 via INTRAVENOUS

## 2017-02-03 MED ORDER — HEPARIN SODIUM (PORCINE) 5000 UNIT/ML IJ SOLN
4000.0000 [IU] | Freq: Once | INTRAMUSCULAR | Status: AC
  Administered 2017-02-03: 4000 [IU] via INTRAVENOUS

## 2017-02-03 MED ORDER — ONDANSETRON HCL 4 MG/2ML IJ SOLN
4.0000 mg | Freq: Four times a day (QID) | INTRAMUSCULAR | Status: DC | PRN
  Administered 2017-02-03: 4 mg via INTRAVENOUS
  Filled 2017-02-03: qty 2

## 2017-02-03 MED ORDER — ONDANSETRON HCL 4 MG/2ML IJ SOLN
INTRAMUSCULAR | Status: AC
  Filled 2017-02-03: qty 2

## 2017-02-03 MED ORDER — NITROGLYCERIN 1 MG/10 ML FOR IR/CATH LAB
INTRA_ARTERIAL | Status: DC | PRN
  Administered 2017-02-03: 200 ug via INTRACORONARY

## 2017-02-03 MED ORDER — SODIUM CHLORIDE 0.9 % IV SOLN
INTRAVENOUS | Status: AC | PRN
  Administered 2017-02-03: 250 mL via INTRAVENOUS

## 2017-02-03 MED ORDER — HEPARIN (PORCINE) IN NACL 2-0.9 UNIT/ML-% IJ SOLN
INTRAMUSCULAR | Status: AC | PRN
  Administered 2017-02-03: 1000 mL

## 2017-02-03 MED ORDER — HEPARIN SODIUM (PORCINE) 1000 UNIT/ML IJ SOLN
INTRAMUSCULAR | Status: DC | PRN
  Administered 2017-02-03: 4000 [IU] via INTRAVENOUS
  Administered 2017-02-03: 3000 [IU] via INTRAVENOUS

## 2017-02-03 MED ORDER — MIDAZOLAM HCL 2 MG/2ML IJ SOLN
INTRAMUSCULAR | Status: AC
  Filled 2017-02-03: qty 2

## 2017-02-03 MED ORDER — ABACAVIR-DOLUTEGRAVIR-LAMIVUD 600-50-300 MG PO TABS
1.0000 | ORAL_TABLET | Freq: Every day | ORAL | Status: DC
  Administered 2017-02-03 – 2017-02-05 (×3): 1 via ORAL
  Filled 2017-02-03 (×5): qty 1

## 2017-02-03 MED ORDER — SODIUM CHLORIDE 0.9 % IV SOLN: 250.0000 mL | INTRAVENOUS | Status: DC | PRN

## 2017-02-03 MED ORDER — NITROGLYCERIN IN D5W 200-5 MCG/ML-% IV SOLN: 0.0000 ug/min | INTRAVENOUS | Status: AC

## 2017-02-03 MED ORDER — OXYCODONE-ACETAMINOPHEN 5-325 MG PO TABS
1.0000 | ORAL_TABLET | ORAL | Status: DC | PRN
  Administered 2017-02-03 – 2017-02-05 (×3): 1 via ORAL
  Filled 2017-02-03 (×3): qty 1

## 2017-02-03 MED ORDER — FENTANYL CITRATE (PF) 100 MCG/2ML IJ SOLN
INTRAMUSCULAR | Status: DC | PRN
  Administered 2017-02-03: 50 ug via INTRAVENOUS

## 2017-02-03 MED ORDER — SODIUM CHLORIDE 0.9% FLUSH: 3.0000 mL | INTRAVENOUS | Status: DC | PRN

## 2017-02-03 MED ORDER — TICAGRELOR 90 MG PO TABS
90.0000 mg | ORAL_TABLET | Freq: Two times a day (BID) | ORAL | Status: DC
  Administered 2017-02-03 – 2017-02-06 (×6): 90 mg via ORAL
  Filled 2017-02-03 (×6): qty 1

## 2017-02-03 MED ORDER — HEPARIN (PORCINE) IN NACL 2-0.9 UNIT/ML-% IJ SOLN
INTRAMUSCULAR | Status: AC
  Filled 2017-02-03: qty 1000

## 2017-02-03 MED ORDER — SODIUM CHLORIDE 0.9 % IV SOLN
INTRAVENOUS | Status: AC
  Administered 2017-02-03: 13:00:00 via INTRAVENOUS

## 2017-02-03 MED ORDER — TIROFIBAN HCL IN NACL 5-0.9 MG/100ML-% IV SOLN
INTRAVENOUS | Status: AC
  Filled 2017-02-03: qty 100

## 2017-02-03 MED ORDER — ASPIRIN 81 MG PO CHEW
324.0000 mg | CHEWABLE_TABLET | Freq: Once | ORAL | Status: AC
  Administered 2017-02-03: 324 mg via ORAL
  Filled 2017-02-03: qty 4

## 2017-02-03 MED ORDER — ASPIRIN 81 MG PO CHEW
81.0000 mg | CHEWABLE_TABLET | Freq: Every day | ORAL | Status: DC
  Administered 2017-02-04 – 2017-02-06 (×3): 81 mg via ORAL
  Filled 2017-02-03 (×3): qty 1

## 2017-02-03 MED ORDER — HEPARIN SODIUM (PORCINE) 5000 UNIT/ML IJ SOLN
5000.0000 [IU] | Freq: Three times a day (TID) | INTRAMUSCULAR | Status: DC
  Administered 2017-02-03 – 2017-02-06 (×8): 5000 [IU] via SUBCUTANEOUS
  Filled 2017-02-03 (×8): qty 1

## 2017-02-03 MED ORDER — TICAGRELOR 90 MG PO TABS: 90.0000 mg | ORAL_TABLET | Freq: Two times a day (BID) | ORAL | Status: DC

## 2017-02-03 MED ORDER — ONDANSETRON HCL 4 MG/2ML IJ SOLN
INTRAMUSCULAR | Status: DC | PRN
  Administered 2017-02-03 (×2): 4 mg via INTRAVENOUS

## 2017-02-03 MED ORDER — HEPARIN SODIUM (PORCINE) 1000 UNIT/ML IJ SOLN
INTRAMUSCULAR | Status: AC
  Filled 2017-02-03: qty 1

## 2017-02-03 MED ORDER — TICAGRELOR 90 MG PO TABS
ORAL_TABLET | ORAL | Status: AC
  Filled 2017-02-03: qty 2

## 2017-02-03 MED ORDER — VERAPAMIL HCL 2.5 MG/ML IV SOLN
INTRAVENOUS | Status: AC
  Filled 2017-02-03: qty 2

## 2017-02-03 MED ORDER — ACETAMINOPHEN 325 MG PO TABS: 650.0000 mg | ORAL_TABLET | ORAL | Status: DC | PRN

## 2017-02-03 MED ORDER — BENZONATATE 100 MG PO CAPS
100.0000 mg | ORAL_CAPSULE | Freq: Three times a day (TID) | ORAL | Status: DC
  Administered 2017-02-04 – 2017-02-05 (×2): 100 mg via ORAL
  Filled 2017-02-03 (×2): qty 1

## 2017-02-03 MED ORDER — HEPARIN SODIUM (PORCINE) 5000 UNIT/ML IJ SOLN
INTRAMUSCULAR | Status: AC
Start: 2017-02-03 — End: 2017-02-03
  Filled 2017-02-03: qty 1

## 2017-02-03 MED ORDER — VERAPAMIL HCL 2.5 MG/ML IV SOLN
INTRAVENOUS | Status: DC | PRN
  Administered 2017-02-03: 10 mL via INTRA_ARTERIAL

## 2017-02-03 MED ORDER — METOPROLOL SUCCINATE ER 25 MG PO TB24
12.5000 mg | ORAL_TABLET | Freq: Every day | ORAL | Status: DC
  Administered 2017-02-03 – 2017-02-06 (×4): 12.5 mg via ORAL
  Filled 2017-02-03 (×4): qty 1

## 2017-02-03 MED ORDER — MIDAZOLAM HCL 2 MG/2ML IJ SOLN
INTRAMUSCULAR | Status: DC | PRN
  Administered 2017-02-03: 1 mg via INTRAVENOUS

## 2017-02-03 MED ORDER — OXYCODONE-ACETAMINOPHEN 10-325 MG PO TABS: 0.5000 | ORAL_TABLET | ORAL | Status: DC | PRN

## 2017-02-03 MED ORDER — IOPAMIDOL (ISOVUE-370) INJECTION 76%
INTRAVENOUS | Status: DC | PRN
  Administered 2017-02-03: 165 mL via INTRA_ARTERIAL

## 2017-02-03 MED ORDER — SODIUM CHLORIDE 0.9% FLUSH
3.0000 mL | Freq: Two times a day (BID) | INTRAVENOUS | Status: DC
  Administered 2017-02-04 – 2017-02-05 (×3): 3 mL via INTRAVENOUS

## 2017-02-03 MED ORDER — TIROFIBAN (AGGRASTAT) BOLUS VIA INFUSION
INTRAVENOUS | Status: DC | PRN
  Administered 2017-02-03: 2200 ug via INTRAVENOUS

## 2017-02-03 MED ORDER — FENTANYL CITRATE (PF) 100 MCG/2ML IJ SOLN
INTRAMUSCULAR | Status: AC
  Filled 2017-02-03: qty 2

## 2017-02-03 MED ORDER — OXYCODONE HCL 5 MG PO TABS: 5.0000 mg | ORAL_TABLET | ORAL | Status: DC | PRN

## 2017-02-03 SURGICAL SUPPLY — 23 items
BALLN EMERGE MR 2.5X15 (BALLOONS) ×2
BALLN ~~LOC~~ EUPHORA RX 3.75X12 (BALLOONS) ×2
BALLN ~~LOC~~ EUPHORA RX 4.0X12 (BALLOONS) ×2
BALLOON EMERGE MR 2.5X15 (BALLOONS) ×1 IMPLANT
BALLOON ~~LOC~~ EUPHORA RX 3.75X12 (BALLOONS) ×1 IMPLANT
BALLOON ~~LOC~~ EUPHORA RX 4.0X12 (BALLOONS) ×1 IMPLANT
CATH INFINITI 5 FR JL3.5 (CATHETERS) ×2 IMPLANT
CATH INFINITI JR4 5F (CATHETERS) ×2 IMPLANT
CATH LAUNCHER 6FR JR4 (CATHETERS) ×2 IMPLANT
COVER PRB 48X5XTLSCP FOLD TPE (BAG) ×1 IMPLANT
COVER PROBE 5X48 (BAG) ×1
DEVICE RAD COMP TR BAND LRG (VASCULAR PRODUCTS) ×2 IMPLANT
GLIDESHEATH SLEND A-KIT 6F 22G (SHEATH) ×2 IMPLANT
GUIDEWIRE INQWIRE 1.5J.035X260 (WIRE) ×1 IMPLANT
INQWIRE 1.5J .035X260CM (WIRE) ×2
KIT ENCORE 26 ADVANTAGE (KITS) ×2 IMPLANT
KIT HEART LEFT (KITS) ×2 IMPLANT
PACK CARDIAC CATHETERIZATION (CUSTOM PROCEDURE TRAY) ×2 IMPLANT
STENT SYNERGY DES 3.5X16 (Permanent Stent) ×2 IMPLANT
TRANSDUCER W/STOPCOCK (MISCELLANEOUS) ×2 IMPLANT
TUBING CIL FLEX 10 FLL-RA (TUBING) ×2 IMPLANT
WIRE ASAHI PROWATER 180CM (WIRE) ×2 IMPLANT
WIRE MARVEL STR TIP 190CM (WIRE) ×2 IMPLANT

## 2017-02-03 NOTE — ED Triage Notes (Signed)
PT states chest pain that started last nite and then started having vomiting and diarrhea.  Denies fever. Code Stemi called at triage

## 2017-02-03 NOTE — H&P (Addendum)
Omar Sandoval is a 61 y.o. male  Admit Date: 02/03/2017 Referring Physician: Emergency department Primary Cardiologist:: New Chief complaint / reason for admission: Greater than 12 hours of continuous chest pain and EKG with ST elevation inferiorly  HPI: 61 year old HIV-positive gentleman who began having chest discomfort at around 9 PM. Came to the emergency room this morning because of persisting chest discomfort (variously reported to be in the 6-10 over 10 range) that he states is severe. No prior history of heart disease. Smokes weed but not cigarettes. No history of bleeding, ulcer disease, stroke, previous myocardial infarction, heart failure, or other medical issues.  His family is not aware of his HIV status. He does not want them to know.  PMH:    Past Medical History:  Diagnosis Date  . HIV (human immunodeficiency virus infection) (HCC)   . Immune deficiency disorder (HCC)     PSH:    Past Surgical History:  Procedure Laterality Date  . COLONOSCOPY    . FRACTURE SURGERY     left ankle and right foot  . HERNIA REPAIR    . OPEN REDUCTION INTERNAL FIXATION (ORIF) DISTAL RADIAL FRACTURE Right 07/20/2016   Procedure: OPEN REDUCTION INTERNAL FIXATION (ORIF) DISTAL RADIAL FRACTURE;  Surgeon: Bradly BienenstockFred Ortmann, MD;  Location: [facility] OR;  Service: Orthopedics;  Laterality: Right;  . SURGERY SCROTAL / TESTICULAR     nondecended testes   ALLERGIES:   Chocolate; Lactose intolerance (gi); and Vicodin [hydrocodone-acetaminophen] Prior to Admit Meds:   Prescriptions Prior to Admission  Medication Sig Dispense Refill Last Dose  . Abacavir-Dolutegravir-Lamivud (TRIUMEQ) 600-50-300 MG TABS Take 1 tablet by mouth at bedtime.    08/30/2016 at pm  . benzonatate (TESSALON) 100 MG capsule Take 1 capsule (100 mg total) by mouth every 8 (eight) hours. (Patient not taking: Reported on 08/31/2016) 21 capsule 0 Not Taking at Unknown time  . Cyanocobalamin (VITAMIN B-12 IJ) Inject 1 Units as directed every  30 (thirty) days.   Not Taking at Unknown time  . docusate sodium (COLACE) 100 MG capsule Take 1 capsule (100 mg total) by mouth 2 (two) times daily. 10 capsule 0 Past Month at Unknown time  . ibuprofen (ADVIL,MOTRIN) 800 MG tablet Take 1 tablet (800 mg total) by mouth 3 (three) times daily. (Patient not taking: Reported on 08/31/2016) 21 tablet 0 Not Taking at Unknown time  . methocarbamol (ROBAXIN) 500 MG tablet Take 1 tablet (500 mg total) by mouth 4 (four) times daily. (Patient not taking: Reported on 08/31/2016) 30 tablet 0 Not Taking at Unknown time  . oxyCODONE-acetaminophen (PERCOCET) 10-325 MG per tablet Take 0.5-1 tablets by mouth every 4 (four) hours as needed for pain. (Patient taking differently: Take 1 tablet by mouth every 4 (four) hours as needed for pain. ) 10 tablet 0 Past Month at Unknown time  . traMADol (ULTRAM) 50 MG tablet Take 1 tablet (50 mg total) by mouth every 6 (six) hours as needed. 20 tablet 0   . vitamin C (ASCORBIC ACID) 500 MG tablet Take 1 tablet (500 mg total) by mouth daily. (Patient not taking: Reported on 08/31/2016) 50 tablet 0 Not Taking at Unknown time   Family HX:    Family History  Problem Relation Age of Onset  . Cancer Mother        lung  . Cancer Brother        lung, liver, and colon   Social HX:    Social History   Social History  .  Marital status: Single    Spouse name: N/A  . Number of children: N/A  . Years of education: N/A   Occupational History  . Not on file.   Social History Main Topics  . Smoking status: Former Smoker    Packs/day: 0.00    Types: Cigarettes  . Smokeless tobacco: Never Used  . Alcohol use 1.8 oz/week    3 Cans of beer per week     Comment: beer daily  . Drug use: Yes    Types: Marijuana  . Sexual activity: Not on file   Other Topics Concern  . Not on file   Social History Narrative  . No narrative on file     ROS HIV-positive, cared for in [place]. Family does not know his HIV diagnosis. No medical  therapy other than occasional steroid injections in his back for chronic back pain. All other systems are negative.  Physical Exam: Blood pressure (!) 158/93, pulse 71, temperature 98.2 F (36.8 C), temperature source Oral, resp. rate 18, SpO2 100 %.   Moderately obese, African-American male having significant chest discomfort when evaluated in trauma B in the emergency room.  Skin is warm and dry. HEENT exam reveals pupils that are equal and reactive. Neck exam reveals no JVD or carotid bruit. Chest is clear including the basis posteriorly. Cardiac exam is difficult due to noise and support apparatus. No obvious murmur or gallop. Abdomen is soft. Bowel sounds are normal. No bruits are heard. Extremities reveal no edema. Femoral pulses 2+. Radials are 2+ bilateral. Long longitudinal incision along the medial aspect of the right wrist and forearm. Neurological exam reveals no motor or sensory deficits.  Labs: Lab Results  Component Value Date   WBC 5.9 08/31/2016   HGB 14.1 08/31/2016   HCT 40.4 08/31/2016   MCV 100.7 (H) 08/31/2016   PLT 322 08/31/2016   No results for input(s): NA, K, CL, CO2, BUN, CREATININE, CALCIUM, PROT, BILITOT, ALKPHOS, ALT, AST, GLUCOSE in the last 168 hours.  Invalid input(s): LABALBU No results found for: CKTOTAL, CKMB, CKMBINDEX, TROPONINI    Radiology:  No results found.  EKG:  Sinus rhythm, right bundle branch block, ST elevation with Q waves in leads II, III, and aVF.  ASSESSMENT:  1. Late presenting inferior wall ST elevation myocardial infarction at least 12 hours in duration but with ongoing significant chest discomfort. 2. Probable undiagnosed hypertension 3. HIV. He requests that family members not be informed of his immune deficiency disease.  Plan:   Recommended emergency catheterization with mechanical reperfusion if possible. The patient understands the procedure and the potential risk. Risk of stroke, death, myocardial infarction,  limb ischemia, bleeding, among others discussed and accepted.  HIV - does not wall family members to be made aware of his HIV status. He receives care in [place]  IV heparin/sublingual nitroglycerin/IV fluid administration/analgesia   Critical care time 45 minutes  [name] III 02/03/2017 10:23 AM

## 2017-02-03 NOTE — ED Provider Notes (Signed)
[facility]-EMERGENCY DEPT Provider Note   CSN: [id number] Arrival date & time: 02/03/17  0944     History   Chief Complaint Chief Complaint  Patient presents with  . Chest Pain    HPI Omar Sandoval is a 61 y.o. male.  The history is provided by the patient and medical records. No language interpreter was used.  Chest Pain   This is a new problem. The current episode started 6 to 12 hours ago. The problem occurs constantly. The problem has not changed since onset.The pain is associated with exertion. The pain is present in the substernal region. The pain is at a severity of 10/10. The pain is severe. The quality of the pain is described as sharp. The pain does not radiate. Duration of episode(s) is 12 hours. Pertinent negatives include no abdominal pain, no back pain, no cough, no fever, no palpitations, no shortness of breath and no vomiting. He has tried rest for the symptoms. The treatment provided no relief. Risk factors include male gender.  Pertinent negatives for past medical history include no seizures.    Past Medical History:  Diagnosis Date  . HIV (human immunodeficiency virus infection) (HCC)   . Immune deficiency disorder [facility])     Patient Active Problem List   Diagnosis Date Noted  . Acute ST elevation myocardial infarction (STEMI) of inferior wall (HCC) 02/03/2017  . HIV disease (HCC) 01/06/2013  . 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 COLONOSCOPY    . FRACTURE SURGERY     left ankle and right foot  . HERNIA REPAIR    . OPEN REDUCTION INTERNAL FIXATION (ORIF) DISTAL RADIAL FRACTURE Right 07/20/2016   Procedure: OPEN REDUCTION INTERNAL FIXATION (ORIF) DISTAL RADIAL FRACTURE;  Surgeon: [name], MD;  Location: [facility] OR;  Service: Orthopedics;  Laterality: Right;  . SURGERY SCROTAL / TESTICULAR     nondecended testes       Home Medications    Prior to Admission medications   Medication Sig Start Date End Date  Taking? Authorizing Provider  Abacavir-Dolutegravir-Lamivud (TRIUMEQ) 600-50-300 MG TABS Take 1 tablet by mouth at bedtime.  03/03/14   [provider]  benzonatate (TESSALON) 100 MG capsule Take 1 capsule (100 mg total) by mouth every 8 (eight) hours. Patient not taking: Reported on 08/31/2016 08/26/15   Sam, Ace Gins, PA-C  Cyanocobalamin (VITAMIN B-12 IJ) Inject 1 Units as directed every 30 (thirty) days.    [provider]  docusate sodium (COLACE) 100 MG capsule Take 1 capsule (100 mg total) by mouth 2 (two) times daily. 07/20/16   [name], MD  ibuprofen (ADVIL,MOTRIN) 800 MG tablet Take 1 tablet (800 mg total) by mouth 3 (three) times daily. Patient not taking: Reported on 08/31/2016 08/26/15   Sam, Ace Gins, PA-C  methocarbamol (ROBAXIN) 500 MG tablet Take 1 tablet (500 mg total) by mouth 4 (four) times daily. Patient not taking: Reported on 08/31/2016 07/20/16   [name], MD  oxyCODONE-acetaminophen (PERCOCET) 10-325 MG per tablet Take 0.5-1 tablets by mouth every 4 (four) hours as needed for pain. Patient taking differently: Take 1 tablet by mouth every 4 (four) hours as needed for pain.  06/21/13   Arthor Captain, PA-C  traMADol (ULTRAM) 50 MG tablet Take 1 tablet (50 mg total) by mouth every 6 (six) hours as needed. 08/31/16   Cathren Laine, MD  vitamin C (ASCORBIC ACID) 500 MG tablet Take 1 tablet (500 mg total) by  mouth daily. Patient not taking: Reported on 08/31/2016 07/20/16   Bradly Bienenstockrtmann, Fred, MD    Family History Family History  Problem Relation Age of Onset  . Cancer Mother        lung  . Cancer Brother        lung, liver, and colon    Social History Social History  Substance Use Topics  . Smoking status: Former Smoker    Packs/day: 0.00    Types: Cigarettes  . Smokeless tobacco: Never Used  . Alcohol use 1.8 oz/week    3 Cans of beer per week     Comment: beer daily     Allergies   Chocolate; Lactose intolerance (gi); and Vicodin  [hydrocodone-acetaminophen]   Review of Systems Review of Systems  Constitutional: Negative for chills and fever.  HENT: Negative for ear pain and sore throat.   Eyes: Negative for pain and visual disturbance.  Respiratory: Negative for cough and shortness of breath.   Cardiovascular: Positive for chest pain. Negative for palpitations.  Gastrointestinal: Negative for abdominal pain and vomiting.  Genitourinary: Negative for dysuria and hematuria.  Musculoskeletal: Negative for arthralgias and back pain.  Skin: Negative for color change and rash.  Neurological: Negative for seizures and syncope.  All other systems reviewed and are negative.    Physical Exam Updated Vital Signs BP (!) 158/93 (BP Location: Left Arm)   Pulse 71   Temp 98.2 F (36.8 C) (Oral)   Resp 18   SpO2 100%   Physical Exam  Constitutional: He appears well-developed. He appears ill. He appears distressed.  HENT:  Head: Normocephalic and atraumatic.  Eyes: Conjunctivae are normal.  Neck: Neck supple.  Cardiovascular: Normal rate and regular rhythm.   No murmur heard. Pulmonary/Chest: Effort normal and breath sounds normal. No respiratory distress.  Abdominal: Soft. There is no tenderness.  Musculoskeletal: He exhibits no edema.  Neurological: He is alert. No cranial nerve deficit. Coordination normal.  5/5 motor strength and intact sensation in all extremities. Intact bilateral finger-to-nose coordination  Skin: Skin is warm and dry.  Nursing note and vitals reviewed.    ED Treatments / Results  Labs (all labs ordered are listed, but only abnormal results are displayed) Labs Reviewed  BASIC METABOLIC PANEL - Abnormal; Notable for the following:       Result Value   Chloride 99 (*)    Glucose, Bld 177 (*)    BUN 5 (*)    All other components within normal limits  CBC - Abnormal; Notable for the following:    RBC 4.05 (*)    MCV 103.0 (*)    MCH 34.1 (*)    All other components within normal  limits  POCT I-STAT TROPONIN I - Abnormal; Notable for the following:    Troponin i, poc 9.21 (*)    All other components within normal limits  PROTIME-INR  I-STAT TROPOININ, ED    EKG  EKG Interpretation  Date/Time:  Monday February 03 2017 09:54:54 EDT Ventricular Rate:  62 PR Interval:  160 QRS Duration: 128 QT Interval:  428 QTC Calculation: 434 R Axis:   7 Text Interpretation:  Critical Test Result: STEMI Normal sinus rhythm Right bundle branch block Lateral infarct , possibly acute Inferior infarct , possibly acute ** ** ACUTE MI / STEMI ** ** Consider right ventricular involvement in acute inferior infarct Abnormal ECG Call Code STEMI Confirmed by Arby BarrettePfeiffer, Marcy [phone]([id number]) on 02/03/2017 9:59:07 AM       Radiology No results  found.  Procedures Procedures (including critical care time)  Medications Ordered in ED Medications  0.9 %  sodium chloride infusion ( Intravenous New Bag/Given 02/03/17 1013)  heparin 5000 UNIT/ML injection (not administered)  ondansetron (ZOFRAN) injection (4 mg Intravenous Given 02/03/17 1030)  lidocaine (PF) (XYLOCAINE) 1 % injection (2 mLs  Given 02/03/17 1033)  heparin infusion 2 units/mL in 0.9 % sodium chloride (1,000 mLs Other New Bag/Given 02/03/17 1033)  midazolam (VERSED) injection (1 mg Intravenous Given 02/03/17 1033)  fentaNYL (SUBLIMAZE) injection (50 mcg Intravenous Given 02/03/17 1033)  Radial Cocktail/Verapamil only (10 mLs Intra-arterial Given 02/03/17 1037)  heparin injection (4,000 Units Intravenous Given 02/03/17 1041)  ticagrelor (BRILINTA) tablet (180 mg Oral Given 02/03/17 1051)  aspirin chewable tablet 324 mg (324 mg Oral Given 02/03/17 1013)  heparin injection 4,000 Units (4,000 Units Intravenous Given by Other 02/03/17 1012)     Initial Impression / Assessment and Plan / ED Course  I have reviewed the triage vital signs and the nursing notes.  Pertinent labs & imaging results that were available during my care of the patient were  reviewed by me and considered in my medical decision making (see chart for details).     61 year old male history of HIV compliant on Triumeq who presents with chest pain with EKG concerning for inferiolateral STEMI. Onset around 2200 yesterday evening, substernal chest pain has been severe and constant. No radiation to arm, neck or back. Driven to hospital today by friend. No previous heart attacks. Did not take ASA yet. Endorses marijuana but denies cocaine use.   AF, VSS. Lungs CTAB. Abdomen soft, benign throughout. 2+ peripheral pulses.   EKG showing STEMI in inferior leads with reciprocal anterior lead ST depressions. Code STEMI activated.  ASA, heparin given. Pt taken emergently to cath lab and stable at time of transfer.  Final Clinical Impressions(s) / ED Diagnoses   Final diagnoses:  None    New Prescriptions Current Discharge Medication List       Hebert Soho, MD 02/03/17 2247    Melene Plan, DO 02/04/17 1018

## 2017-02-03 NOTE — ED Notes (Addendum)
Pants with belt, shirt, shoes, socks with patient to cath lab. Phone and glasses locked up with security

## 2017-02-03 NOTE — Progress Notes (Signed)
CRITICAL VALUE ALERT  Critical Value:  Troponin >65  Date & Time Notied:  1415  Provider Notified: Su Hiltoberts, NP   Orders Received/Actions taken: No knew orders.   Delories HeinzMelissa Kingslee Dowse, RN

## 2017-02-04 DIAGNOSIS — E785 Hyperlipidemia, unspecified: Secondary | ICD-10-CM

## 2017-02-04 LAB — COMPREHENSIVE METABOLIC PANEL
ALT: 64 U/L — ABNORMAL HIGH (ref 17–63)
AST: 351 U/L — AB (ref 15–41)
Albumin: 3.4 g/dL — ABNORMAL LOW (ref 3.5–5.0)
Alkaline Phosphatase: 62 U/L (ref 38–126)
Anion gap: 5 (ref 5–15)
BUN: <5 mg/dL — ABNORMAL LOW (ref 6–20)
CHLORIDE: 98 mmol/L — AB (ref 101–111)
CO2: 26 mmol/L (ref 22–32)
Calcium: 8.9 mg/dL (ref 8.9–10.3)
Creatinine, Ser: 0.64 mg/dL (ref 0.61–1.24)
Glucose, Bld: 142 mg/dL — ABNORMAL HIGH (ref 65–99)
POTASSIUM: 3.6 mmol/L (ref 3.5–5.1)
Sodium: 129 mmol/L — ABNORMAL LOW (ref 135–145)
Total Bilirubin: 0.8 mg/dL (ref 0.3–1.2)
Total Protein: 5.9 g/dL — ABNORMAL LOW (ref 6.5–8.1)

## 2017-02-04 LAB — TROPONIN I

## 2017-02-04 MED ORDER — ATORVASTATIN CALCIUM 20 MG PO TABS
20.0000 mg | ORAL_TABLET | Freq: Every day | ORAL | Status: DC
  Administered 2017-02-04: 20 mg via ORAL
  Filled 2017-02-04: qty 1

## 2017-02-04 NOTE — Progress Notes (Signed)
Per insurance check for Brilinta # 4. S/W ZOMMEREAL  @ [employer] RX # [phone]    1. BRILINTA  90 MG BID   COVER- YES  CO-PAY- ZERO DOLLARS Q/L 2 PER DAYS  TIER- 3 DRUG  PRIOR APPROVAL- NO   [facility]ACY : [facility] AND [facility]   MEDICAID [place] EFF-DATE 05-26-2013  CO-PAY- $ 3.70 FOR EACH RX

## 2017-02-04 NOTE — Progress Notes (Signed)
.  EKG CRITICAL VALUE     12 lead EKG performed.  Critical value noted.  Aundra MilletMegan, RN notified.   SCALES-PRICE,Kassidie Hendriks, CCT 02/04/2017 6:53AM.

## 2017-02-04 NOTE — Progress Notes (Signed)
Cardiology PA on call notified of morning EKG results. She will look at EKG. All other patient vital signs stable. Will continue to monitor and inform day shift nurse.

## 2017-02-04 NOTE — Progress Notes (Signed)
CARDIAC REHAB PHASE I   PRE:  Rate/Rhythm:65 SR  BP:  Sitting: 111/83        SaO2: 94 Ra  MODE:  Ambulation: 740 ft   POST:  Rate/Rhythm: 101 ST  BP:  Sitting: 117/88         SaO2: 96 RA  Pt ambulated 740 ft on RA, handheld assist, steady gait, tolerated well with no complaints. Completed MI/stent education.  Reviewed risk factors, MI book, anti-platelet therapy, stent card, activity restrictions, ntg, exercise, heart healthy diet and phase 2 cardiac rehab. Pt verbalized understanding. Pt agrees to phase 2 cardiac rehab referral[facility] Pt to recliner after walk, call bell within reach. Will follow.   [id number] Omar GrapesEmily C Oniel Meleski, RN, BSN 02/04/2017 11:54 AM

## 2017-02-04 NOTE — Progress Notes (Addendum)
Progress Note  Patient Name: Macario Goldsaul R Oquendo Date of Encounter: 02/04/2017  Primary Cardiologist: Katrinka BlazingSmith (new)  Subjective   No recurrent chest pain.  Inpatient Medications    Scheduled Meds: . abacavir-dolutegravir-lamiVUDine  1 tablet Oral QHS  . aspirin  81 mg Oral Daily  . benzonatate  100 mg Oral Q8H  . heparin  5,000 Units Subcutaneous Q8H  . metoprolol succinate  12.5 mg Oral Daily  . sodium chloride flush  3 mL Intravenous Q12H  . ticagrelor  90 mg Oral BID   Continuous Infusions: . sodium chloride     PRN Meds: sodium chloride, acetaminophen, ondansetron (ZOFRAN) IV, oxyCODONE-acetaminophen **AND** oxyCODONE, sodium chloride flush   Vital Signs    Vitals:   02/04/17 0600 02/04/17 0700 02/04/17 0900 02/04/17 1100  BP: 125/86  113/80   Pulse: 64  65   Resp: 15  (!) 27   Temp:  98.2 F (36.8 C)  98.3 F (36.8 C)  TempSrc:  Oral  Oral  SpO2: 100%  96%     Intake/Output Summary (Last 24 hours) at 02/04/17 1524 Last data filed at 02/04/17 0900  Gross per 24 hour  Intake              760 ml  Output             1200 ml  Net             -440 ml    I/O since admission:   There were no vitals filed for this visit.  Telemetry    NSR 84 - Personally Reviewed  ECG    ECG (independently read by me): NSR at 70 with evolving inferior posterior MI changes with inferior Q waves and T wave inversion; prominent R wave in V2.  Resolution of inferior STE c/w 02/03/17 ECG  Physical Exam   BP 113/80   Pulse 65   Temp 98.3 F (36.8 C) (Oral)   Resp (!) 27   SpO2 96%  General: Alert, oriented, no distress.  Skin: normal turgor, no rashes, warm and dry HEENT: Normocephalic, atraumatic. Pupils equal round and reactive to light; sclera anicteric; extraocular muscles intact;  Nose without nasal septal hypertrophy Mouth/Parynx benign; Mallinpatti scale Neck: No JVD, no carotid bruits; normal carotid upstroke Lungs: clear to ausculatation and percussion; no wheezing  or rales Chest wall: without tenderness to palpitation Heart: PMI not displaced, RRR, s1 s2 normal, 1/6 systolic murmur, no diastolic murmur, no rubs, gallops, thrills, or heaves Abdomen: soft, nontender; no hepatosplenomehaly, BS+; abdominal aorta nontender and not dilated by palpation. Back: no CVA tenderness Pulses 2+ Musculoskeletal: full range of motion, normal strength, no joint deformities Extremities: no clubbing cyanosis or edema, Homan's sign negative  Neurologic: grossly nonfocal; Cranial nerves grossly wnl Psychologic: Normal mood and affect   Labs    Chemistry Recent Labs Lab 02/03/17 0956 02/03/17 1054 02/03/17 1323 02/04/17 0027  NA 135 135  --  129*  K 3.6 3.4*  --  3.6  CL 99* 96*  --  98*  CO2 28  --   --  26  GLUCOSE 177* 164*  --  142*  BUN 5* 4*  --  <5*  CREATININE 0.68 0.50* 0.69 0.64  CALCIUM 9.0  --   --  8.9  PROT  --   --   --  5.9*  ALBUMIN  --   --   --  3.4*  AST  --   --   --  351*  ALT  --   --   --  64*  ALKPHOS  --   --   --  62  BILITOT  --   --   --  0.8  GFRNONAA >60  --  >60 >60  GFRAA >60  --  >60 >60  ANIONGAP 8  --   --  5     Hematology Recent Labs Lab 02/03/17 0956 02/03/17 1054 02/03/17 1323  WBC 6.3  --  8.5  RBC 4.05*  --  3.83*  HGB 13.8 12.6* 13.3  HCT 41.7 37.0* 38.9*  MCV 103.0*  --  101.6*  MCH 34.1*  --  34.7*  MCHC 33.1  --  34.2  RDW 13.8  --  13.5  PLT 306  --  323    Cardiac Enzymes Recent Labs Lab 02/03/17 1323 02/03/17 1857 02/04/17 0027  TROPONINI >65.00* >65.00* >65.00*    Recent Labs Lab 02/03/17 1008  TROPIPOC 9.21*     BNP Recent Labs Lab 02/03/17 1323  BNP 83.2     DDimer No results for input(s): DDIMER in the last 168 hours.   Lipid Panel     Component Value Date/Time   CHOL 156 02/03/2017 1323   TRIG 39 02/03/2017 1323   HDL 47 02/03/2017 1323   CHOLHDL 3.3 02/03/2017 1323   VLDL 8 02/03/2017 1323   LDLCALC 101 (H) 02/03/2017 1323    Radiology    Dg Chest Port  1 View  Result Date: 02/03/2017 CLINICAL DATA:  Acute MI with stent placement today. EXAM: PORTABLE CHEST 1 VIEW COMPARISON:  08/31/2016 FINDINGS: Lungs are hypoinflated without focal airspace consolidation, effusion or pneumothorax. Cardiomediastinal silhouette is within normal. Several old right posterolateral rib fractures. IMPRESSION: Hypoinflation without acute cardiopulmonary disease. Electronically Signed   By: Elberta Fortis M.D.   On: 02/03/2017 13:44    Cardiac Studies   Conclusion    Acute inferior wall ST elevation MI, late presenting (greater than 12 hours) with ongoing pain.  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  Successful PCI and stenting of the mid RCA from 100% obstruction to less than 15% with TIMI grade 3 flow using a 16 x 3.5 mm Synergy DES. Postdilatation final diameter 4.0 mm at high pressure.  70% proximal to mid LAD.  60-70% distal circumflex.  Inferior wall severe hypokinesis. LVEF 40-50%. Elevated LVEDP.  RECOMMENDATIONS:   Dual antiplatelet therapy, aspirin and Brilinta.  Low-dose beta blocker therapy, high intensity statin therapy, and additional therapy is required to control blood pressure.  IV nitroglycerin for 12-24 hours to help with blood pressure and also dilate the microcirculation beyond the stent.  Pharmacy to help [place] avoid interaction with HIV therapy.  Probable outpatient myocardial perfusion study to exclude LAD territory ischemia.      Patient Profile     61 y.o. male admitted 02/03/17 with inferior STEMI.  Assessment & Plan    1. Inferior STEMI: day 1 with > 12 hrs on presentation; peak Troponin > 65. No recurrent chest pain; Evolving T wave inversion inferiorly on ECG. Now on BB/ASA/Brilinta.  Add low dose ACE-I or ARB as BP allows, currently low.  2. Hyperlipidemia: LDL 101; add lower dose statin with HIV drugs.  3. HIV: on triple therapy 4. Macrocytosis: check B12/folate  Signed, Lennette Bihari, MD, [facility] 02/04/2017, 3:24 PM

## 2017-02-05 DIAGNOSIS — I952 Hypotension due to drugs: Secondary | ICD-10-CM

## 2017-02-05 LAB — BASIC METABOLIC PANEL
Anion gap: 9 (ref 5–15)
BUN: 6 mg/dL (ref 6–20)
CALCIUM: 8.5 mg/dL — AB (ref 8.9–10.3)
CHLORIDE: 101 mmol/L (ref 101–111)
CO2: 26 mmol/L (ref 22–32)
CREATININE: 0.73 mg/dL (ref 0.61–1.24)
GFR calc Af Amer: >60 mL/min (ref >60)
GFR calc non Af Amer: >60 mL/min (ref >60)
GLUCOSE: 108 mg/dL — AB (ref 65–99)
Potassium: 3.7 mmol/L (ref 3.5–5.1)
Sodium: 136 mmol/L (ref 135–145)

## 2017-02-05 LAB — HEMOGLOBIN A1C
Hgb A1c MFr Bld: 5.5 % (ref 4.8–5.6)
MEAN PLASMA GLUCOSE: 111 mg/dL

## 2017-02-05 LAB — FOLATE: Folate: 8.3 ng/mL

## 2017-02-05 LAB — VITAMIN B12: Vitamin B-12: 241 pg/mL (ref 180–914)

## 2017-02-05 MED ORDER — ATORVASTATIN CALCIUM 40 MG PO TABS
40.0000 mg | ORAL_TABLET | Freq: Every day | ORAL | Status: DC
  Administered 2017-02-05: 40 mg via ORAL
  Filled 2017-02-05: qty 1

## 2017-02-05 MED ORDER — PNEUMOCOCCAL VAC POLYVALENT 25 MCG/0.5ML IJ INJ
0.5000 mL | INJECTION | INTRAMUSCULAR | Status: DC
  Filled 2017-02-05: qty 0.5

## 2017-02-05 MED FILL — Tirofiban HCl in NaCl 0.9% IV Soln 5 MG/100ML (Base Equiv): INTRAVENOUS | Qty: 100 | Status: AC

## 2017-02-05 NOTE — Progress Notes (Signed)
CARDIAC REHAB PHASE I   PRE:  Rate/Rhythm: 70 SR    BP: sitting 93/71    SaO2: 98 RA  MODE:  Ambulation: 780 ft   POST:  Rate/Rhythm: 93 SR    BP: sitting 111/81     SaO2: 98 RA  Tolerated well, no c/o. Steady. Anxious to go home. [id number]   Omar MassonRandi Kristan Monee Sandoval CES, ACSM 02/05/2017 3:21 PM

## 2017-02-05 NOTE — Care Management Note (Signed)
Case Management Note Omar PieriniKristi Reyah Streeter RN, BSN Unit 2W-Case Manager- 2H coverage [phone]  Patient Details  Name: Omar Sandoval MRN: [id number] Date of Birth: 08/28/1955  Subjective/Objective:  [facility] with DES [facility]                   Action/Plan: PTA pt lived at home- independent- plan to return home- referral received for Brilinta needs per insurance check pt has zero dollar copay- spoke with pt at bedside- coverage info shared- pt given 30 day free card to use- per pt [facility] [place]- call made to pharmacy - and they do have drug in stock to fill today.   Expected Discharge Date:      02/05/17            Expected Discharge Plan:  Home/Self Care  In-House Referral:     Discharge planning Services  CM Consult, Medication Assistance  Post Acute Care Choice:  NA Choice offered to:  NA  DME Arranged:    DME Agency:     HH Arranged:    HH Agency:     Status of Service:  Completed, signed off  If discussed at Long Length of Stay Meetings, dates discussed:    Discharge Disposition: home/self care   Additional Comments:  Darrold SpanWebster, Sharrieff Spratlin Hall, RN 02/05/2017, 11:23 AM

## 2017-02-05 NOTE — Progress Notes (Signed)
Progress Note  Patient Name: Omar Sandoval Date of Encounter: 02/05/2017  Primary Cardiologist: Katrinka Blazing (new)  Subjective   No recurrent chest pain.  Inpatient Medications    Scheduled Meds: . abacavir-dolutegravir-lamiVUDine  1 tablet Oral QHS  . aspirin  81 mg Oral Daily  . atorvastatin  40 mg Oral q1800  . benzonatate  100 mg Oral Q8H  . heparin  5,000 Units Subcutaneous Q8H  . metoprolol succinate  12.5 mg Oral Daily  . [START ON 02/06/2017] pneumococcal 23 valent vaccine  0.5 mL Intramuscular Tomorrow-1000  . sodium chloride flush  3 mL Intravenous Q12H  . ticagrelor  90 mg Oral BID   Continuous Infusions: . sodium chloride     PRN Meds: sodium chloride, acetaminophen, ondansetron (ZOFRAN) IV, oxyCODONE-acetaminophen **AND** oxyCODONE, sodium chloride flush   Vital Signs    Vitals:   02/05/17 1100 02/05/17 1200 02/05/17 1300 02/05/17 1400  BP: 96/68 98/69  (!) 87/65  Pulse: 73 80  82  Resp: 14 20 (!) 24 18  Temp:  98.8 F (37.1 C)    TempSrc:  Oral    SpO2: 98% 97%  99%  Weight:      Height:        Intake/Output Summary (Last 24 hours) at 02/05/17 1604 Last data filed at 02/05/17 1200  Gross per 24 hour  Intake             1770 ml  Output             1100 ml  Net              670 ml    I/O since admission: +20  Filed Weights   02/05/17 0600  Weight: 189 lb (85.7 kg)    Telemetry    NSR 79 - Personally Reviewed  ECG    02/05/17 ECG (independently read by me): NSR 64; RBBB, inferior Q waves with T wave inversion inferiorly  ECG (independently read by me): NSR at 70 with evolving inferior posterior MI changes with inferior Q waves and T wave inversion; prominent R wave in V2.  Resolution of inferior STE c/w 02/03/17 ECG  Physical Exam    BP (!) 87/65   Pulse 82   Temp 98.8 F (37.1 C) (Oral)   Resp 18   Ht 5\' 8"  (1.727 m)   Wt 189 lb (85.7 kg)   SpO2 99%   BMI 28.74 kg/m  General: Alert, oriented, no distress.  Skin: normal turgor, no  rashes, warm and dry HEENT: Normocephalic, atraumatic. Pupils equal round and reactive to light; sclera anicteric; extraocular muscles intact;  Nose without nasal septal hypertrophy Mouth/Parynx benign; Mallinpatti scale 3 Neck: No JVD, no carotid bruits; normal carotid upstroke Lungs: clear to ausculatation and percussion; no wheezing or rales Chest wall: without tenderness to palpitation Heart: PMI not displaced, RRR, s1 s2 normal, 1/6 systolic murmur, no diastolic murmur, no rubs, gallops, thrills, or heaves Abdomen: soft, nontender; no hepatosplenomehaly, BS+; abdominal aorta nontender and not dilated by palpation. Back: no CVA tenderness Pulses 2+ Musculoskeletal: full range of motion, normal strength, no joint deformities Extremities: no clubbing cyanosis or edema, Homan's sign negative  Neurologic: grossly nonfocal; Cranial nerves grossly wnl Psychologic: Normal mood and affect    Labs    Chemistry  Recent Labs Lab 02/03/17 0956 02/03/17 1054 02/03/17 1323 02/04/17 0027 02/05/17 0240  NA 135 135  --  129* 136  K 3.6 3.4*  --  3.6 3.7  CL  99* 96*  --  98* 101  CO2 28  --   --  26 26  GLUCOSE 177* 164*  --  142* 108*  BUN 5* 4*  --  <5* 6  CREATININE 0.68 0.50* 0.69 0.64 0.73  CALCIUM 9.0  --   --  8.9 8.5*  PROT  --   --   --  5.9*  --   ALBUMIN  --   --   --  3.4*  --   AST  --   --   --  351*  --   ALT  --   --   --  64*  --   ALKPHOS  --   --   --  62  --   BILITOT  --   --   --  0.8  --   GFRNONAA >60  --  >60 >60 >60  GFRAA >60  --  >60 >60 >60  ANIONGAP 8  --   --  5 9     Hematology  Recent Labs Lab 02/03/17 0956 02/03/17 1054 02/03/17 1323  WBC 6.3  --  8.5  RBC 4.05*  --  3.83*  HGB 13.8 12.6* 13.3  HCT 41.7 37.0* 38.9*  MCV 103.0*  --  101.6*  MCH 34.1*  --  34.7*  MCHC 33.1  --  34.2  RDW 13.8  --  13.5  PLT 306  --  323    Cardiac Enzymes  Recent Labs Lab 02/03/17 1323 02/03/17 1857 02/04/17 0027  TROPONINI >65.00* >65.00*  >65.00*     Recent Labs Lab 02/03/17 1008  TROPIPOC 9.21*     BNP  Recent Labs Lab 02/03/17 1323  BNP 83.2     DDimer No results for input(s): DDIMER in the last 168 hours.   Lipid Panel     Component Value Date/Time   CHOL 156 02/03/2017 1323   TRIG 39 02/03/2017 1323   HDL 47 02/03/2017 1323   CHOLHDL 3.3 02/03/2017 1323   VLDL 8 02/03/2017 1323   LDLCALC 101 (H) 02/03/2017 1323    Radiology    No results found.  Cardiac Studies   Conclusion    Acute inferior wall ST elevation MI, late presenting (greater than 12 hours) with ongoing pain.  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  Successful PCI and stenting of the mid RCA from 100% obstruction to less than 15% with TIMI grade 3 flow using a 16 x 3.5 mm Synergy DES. Postdilatation final diameter 4.0 mm at high pressure.  70% proximal to mid LAD.  60-70% distal circumflex.  Inferior wall severe hypokinesis. LVEF 40-50%. Elevated LVEDP.  RECOMMENDATIONS:   Dual antiplatelet therapy, aspirin and Brilinta.  Low-dose beta blocker therapy, high intensity statin therapy, and additional therapy is required to control blood pressure.  IV nitroglycerin for 12-24 hours to help with blood pressure and also dilate the microcirculation beyond the stent.  Pharmacy to help us avoid interaction with HIV therapy.  Probable outpatient myocardial perfusion study to exclude LAD territory ischemia.      Patient Profile     61 y.o. male admitted 02/03/17 with inferior STEMI.  Assessment & Plan    1. Inferior STEMI: day 2 with > 12 hrs on presentation; peak Troponin > 65. No recurrent chest pain; Evolving T wave inversion inferiorly on ECG. Now on BB/ASA/Brilinta.  Add low dose ACE-I or ARB if BP allows, currently low with BP 95 - 99 range. .[name]  Will check echo to assess RV fxn as well as LV.  2. Hyperlipidemia: LDL 101; add lower dose statin with HIV drugs. Can increase to 40 mg  today but would not titrate to 80 mg.   3. HIV: on triple therapy  4. Macrocytosis: folate 8.3; B12 241  Will transfer to telemetry. Probable dc tomorrow.   Signed, Lennette Bihari, MD, [facility] 02/05/2017, 4:04 PM

## 2017-02-05 NOTE — Progress Notes (Signed)
[facility] and pt transferred to rm 3w09.  Pt has no s/s of any acute distress or c/o pain.  vss

## 2017-02-06 ENCOUNTER — Inpatient Hospital Stay (HOSPITAL_COMMUNITY): Payer: Medicare HMO

## 2017-02-06 DIAGNOSIS — I25119 Atherosclerotic heart disease of native coronary artery with unspecified angina pectoris: Secondary | ICD-10-CM

## 2017-02-06 DIAGNOSIS — I360 Nonrheumatic tricuspid (valve) stenosis: Secondary | ICD-10-CM

## 2017-02-06 DIAGNOSIS — I340 Nonrheumatic mitral (valve) insufficiency: Secondary | ICD-10-CM

## 2017-02-06 DIAGNOSIS — E785 Hyperlipidemia, unspecified: Secondary | ICD-10-CM

## 2017-02-06 LAB — ECHOCARDIOGRAM COMPLETE
HEIGHTINCHES: 68 in
Weight: 2915.2 oz

## 2017-02-06 MED ORDER — ATORVASTATIN CALCIUM 40 MG PO TABS: 40.0000 mg | ORAL_TABLET | Freq: Every day | ORAL | 3 refills | Status: DC

## 2017-02-06 MED ORDER — NITROGLYCERIN 0.4 MG SL SUBL: 0.4000 mg | SUBLINGUAL_TABLET | SUBLINGUAL | 3 refills | Status: DC | PRN

## 2017-02-06 MED ORDER — TICAGRELOR 90 MG PO TABS: 90.0000 mg | ORAL_TABLET | Freq: Two times a day (BID) | ORAL | 0 refills | Status: DC

## 2017-02-06 MED ORDER — METOPROLOL SUCCINATE ER 25 MG PO TB24: 12.5000 mg | ORAL_TABLET | Freq: Every day | ORAL | 2 refills | Status: DC

## 2017-02-06 MED ORDER — TICAGRELOR 90 MG PO TABS: 90.0000 mg | ORAL_TABLET | Freq: Two times a day (BID) | ORAL | 10 refills | Status: DC

## 2017-02-06 MED ORDER — ASPIRIN 81 MG PO CHEW: 81.0000 mg | CHEWABLE_TABLET | Freq: Every day | ORAL | Status: DC

## 2017-02-06 NOTE — Discharge Summary (Signed)
Discharge Summary    Patient ID: Omar Sandoval,  MRN: [id number], DOB/AGE: 60/04/1956 60 y.o.  Admit date: 02/03/2017 Discharge date: 02/06/2017  Primary Care Provider: Bartholomew BoardsBarroso, Luis Primary Cardiologist: Katrinka BlazingSmith   Discharge Diagnoses    Active Problems:   Acute ST elevation myocardial infarction (STEMI) of inferior wall (HCC)   Hyperlipidemia   CAD (coronary artery disease)   HIV disease (HCC)   Acute MI, inferior wall (HCC)   Allergies Allergies  Allergen Reactions  . Chocolate Diarrhea  . Lactose Intolerance (Gi) Diarrhea and Nausea And Vomiting  . Vicodin [Hydrocodone-Acetaminophen] Nausea Only and Other (See Comments)    GI Upset    Diagnostic Studies/Procedures    LHC: 02/03/17  Conclusion    Acute inferior wall ST elevation MI, late presenting (greater than 12 hours) with ongoing pain.  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  Successful PCI and stenting of the mid RCA from 100% obstruction to less than 15% with TIMI grade 3 flow using a 16 x 3.5 mm Synergy DES. Postdilatation final diameter 4.0 mm at high pressure.  70% proximal to mid LAD.  60-70% distal circumflex.  Inferior wall severe hypokinesis. LVEF 40-50%. Elevated LVEDP.  RECOMMENDATIONS:   Dual antiplatelet therapy, aspirin and Brilinta.  Low-dose beta blocker therapy, high intensity statin therapy, and additional therapy is required to control blood pressure.  IV nitroglycerin for 12-24 hours to help with blood pressure and also dilate the microcirculation beyond the stent.  Pharmacy to help us avoid interaction with HIV therapy.  Probable outpatient myocardial perfusion study to exclude LAD territory ischemia.   _____________   History of Present Illness     61 year old HIV-positive gentleman who began having chest discomfort at around 9 PM the night prior to admission. Came to the emergency room that morning because of persisting chest discomfort  (variously reported to be in the 6-10 over 10 range) that he states is severe. No prior history of heart disease. Smokes weed but not cigarettes. No history of bleeding, ulcer disease, stroke, previous myocardial infarction, heart failure, or other medical issues. EKG showed RBBB with ST elevation with Q waves in leads II, III and aVF. He was brought to the cath lab for emergent cardiac catheterization  Hospital Course     He underwent LHC with Dr. Katrinka BlazingSmith noted above with DES x[facility]. Plan for DAPT with ASA/Brilinta for at least one year. Also added low dose BB, and Lipitor 40mg  daily, as to not interact with his HIV therapy. Troponin peaked at >65, with EKG showing evolving TWI in inferior leads. LDL 101.  He was transferred out to telemetry on 02/05/17. Worked well with cardiac rehab. No further c/o chest pain. Echo was done, and briefly viewed by Dr. Tresa EndoKelly prior to discharge, but official read was pending. Unable to add ACEi/ARB due to soft blood pressures.   He was seen by Dr. Tresa EndoKelly and determined stable for discharge home. Follow up has been arranged in the office. Medications are listed below.    General: Well developed, well nourished, male appearing in no acute distress. Head: Normocephalic, atraumatic.  Neck: Supple without bruits, JVD. Lungs:  Resp regular and unlabored, CTA. Heart: RRR, S1, S2, no S3, S4, or murmur; no rub. Abdomen: Soft, non-tender, non-distended with normoactive bowel sounds. No hepatomegaly. No rebound/guarding. No obvious abdominal masses. Extremities: No clubbing, cyanosis, edema. Distal pedal pulses are 2+ bilaterally. R cath site stable without bruising or hematoma  Neuro: Alert and oriented X 3. Moves all extremities spontaneously. Psych: Normal affect.  _____________  Discharge Vitals Blood pressure 105/84, pulse 79, temperature 98.3 F (36.8 C), temperature source Oral, resp. rate 20, height 5\' 8"  (1.727 m), weight 182 lb 3.2 oz (82.6 kg), SpO2  97 %.  Filed Weights   02/05/17 0600 02/06/17 0423  Weight: 189 lb (85.7 kg) 182 lb 3.2 oz (82.6 kg)    Labs & Radiologic Studies    CBC No results for input(s): WBC, NEUTROABS, HGB, HCT, MCV, PLT in the last 72 hours. Basic Metabolic Panel  Recent Labs  02/04/17 0027 02/05/17 0240  NA 129* 136  K 3.6 3.7  CL 98* 101  CO2 26 26  GLUCOSE 142* 108*  BUN <5* 6  CREATININE 0.64 0.73  CALCIUM 8.9 8.5*   Liver Function Tests  Recent Labs  02/04/17 0027  AST 351*  ALT 64*  ALKPHOS 62  BILITOT 0.8  PROT 5.9*  ALBUMIN 3.4*   No results for input(s): LIPASE, AMYLASE in the last 72 hours. Cardiac Enzymes  Recent Labs  02/03/17 1857 02/04/17 0027  TROPONINI >65.00* >65.00*   BNP Invalid input(s): POCBNP D-Dimer No results for input(s): DDIMER in the last 72 hours. Hemoglobin A1C  Recent Labs  02/04/17 0027  HGBA1C 5.5   Fasting Lipid Panel No results for input(s): CHOL, HDL, LDLCALC, TRIG, CHOLHDL, LDLDIRECT in the last 72 hours. Thyroid Function Tests No results for input(s): TSH, T4TOTAL, T3FREE, THYROIDAB in the last 72 hours.  Invalid input(s): FREET3 _____________  Dg Chest Port 1 View  Result Date: 02/03/2017 CLINICAL DATA:  Acute MI with stent placement today. EXAM: PORTABLE CHEST 1 VIEW COMPARISON:  08/31/2016 FINDINGS: Lungs are hypoinflated without focal airspace consolidation, effusion or pneumothorax. Cardiomediastinal silhouette is within normal. Several old right posterolateral rib fractures. IMPRESSION: Hypoinflation without acute cardiopulmonary disease. Electronically Signed   By: Elberta Fortis M.D.   On: 02/03/2017 13:44   Disposition   Pt is being discharged home today in good condition.  Follow-up Plans & Appointments    Follow-up Information    Rosalio Macadamia, NP Follow up on 02/19/2017.   Specialties:  Nurse Practitioner, Interventional Cardiology, Cardiology, Radiology Why:  at 11:30am for your follow up appt.  Contact  information: [address] [place] [address] [phone]          Discharge Instructions    Amb Referral to Cardiac Rehabilitation    Complete by:  As directed    Diagnosis:   Coronary Stents STEMI     Call MD for:  redness, tenderness, or signs of infection (pain, swelling, redness, odor or green/yellow discharge around incision site)    Complete by:  As directed    Diet - low sodium heart healthy    Complete by:  As directed    Discharge instructions    Complete by:  As directed    Radial Site Care Refer to this sheet in the next few weeks. These instructions provide you with information on caring for yourself after your procedure. Your caregiver may also give you more specific instructions. Your treatment has been planned according to current medical practices, but problems sometimes occur. Call your caregiver if you have any problems or questions after your procedure. HOME CARE INSTRUCTIONS You may shower the day after the procedure.Remove the bandage (dressing) and gently wash the site with plain soap and water.Gently pat the site dry.  Do not apply powder or lotion to  the site.  Do not submerge the affected site in water for 3 to 5 days.  Inspect the site at least twice daily.  Do not flex or bend the affected arm for 24 hours.  No lifting over 5 pounds (2.3 kg) for 5 days after your procedure.  Do not drive home if you are discharged the same day of the procedure. Have someone else drive you.  You may drive 24 hours after the procedure unless otherwise instructed by your caregiver.  What to expect: Any bruising will usually fade within 1 to 2 weeks.  Blood that collects in the tissue (hematoma) may be painful to the touch. It should usually decrease in size and tenderness within 1 to 2 weeks.  SEEK IMMEDIATE MEDICAL CARE IF: You have unusual pain at the radial site.  You have redness, warmth, swelling, or pain at the radial site.  You have drainage  (other than a small amount of blood on the dressing).  You have chills.  You have a fever or persistent symptoms for more than 72 hours.  You have a fever and your symptoms suddenly get worse.  Your arm becomes pale, cool, tingly, or numb.  You have heavy bleeding from the site. Hold pressure on the site.   PLEASE DO NOT MISS ANY DOSES OF YOUR BRILINTA!!   Increase activity slowly    Complete by:  As directed       Discharge Medications   Discharge Medication List as of 02/06/2017  1:37 PM    START taking these medications   Details  aspirin 81 MG chewable tablet Chew 1 tablet (81 mg total) by mouth daily., Starting Fri 02/07/2017, OTC    atorvastatin (LIPITOR) 40 MG tablet Take 1 tablet (40 mg total) by mouth daily at 6 PM., Starting Thu 02/06/2017, Print    metoprolol succinate (TOPROL-XL) 25 MG 24 hr tablet Take 0.5 tablets (12.5 mg total) by mouth daily., Starting Fri 02/07/2017, Print    nitroGLYCERIN (NITROSTAT) 0.4 MG SL tablet Place 1 tablet (0.4 mg total) under the tongue every 5 (five) minutes as needed., Starting Thu 02/06/2017, Print      CONTINUE these medications which have CHANGED   Details  ticagrelor (BRILINTA) 90 MG TABS tablet Take 1 tablet (90 mg total) by mouth 2 (two) times daily., Starting Thu 02/06/2017, Normal      CONTINUE these medications which have NOT CHANGED   Details  Abacavir-Dolutegravir-Lamivud (TRIUMEQ) 600-50-300 MG TABS Take 1 tablet by mouth daily. , Starting Thu 03/03/2014, Historical Med    oxyCODONE-acetaminophen (PERCOCET) 10-325 MG per tablet Take 0.5-1 tablets by mouth every 4 (four) hours as needed for pain., Starting 06/21/2013, Until Discontinued, Print      STOP taking these medications     benzonatate (TESSALON) 100 MG capsule          Aspirin prescribed at discharge?  Yes High Intensity Statin Prescribed? (Lipitor 40-80mg  or Crestor 20-40mg ): Yes Beta Blocker Prescribed? Yes For EF <40%, was ACEI/ARB Prescribed? No: Blood  pressure to soft ADP Receptor Inhibitor Prescribed? (i.e. Plavix etc.-Includes Medically Managed Patients): Yes For EF <40%, Aldosterone Inhibitor Prescribed? No: EF ok Was EF assessed during THIS hospitalization? Yes Was Cardiac Rehab II ordered? (Included Medically managed Patients): Yes   Outstanding Labs/Studies   FLP/LFTs in 6 weeks if tolerating statin.   Duration of Discharge Encounter   Greater than 30 minutes including physician time.  Signed, Laverda Page NP-C 02/06/2017, 2:28 PM   Patient seen and examined.  Agree with assessment and plan. No recurrent chest pain.  Echo being completed now. Mild inferior hypokinesis; RV fxn appears ok.  BP better today but still low add low dose ACE-I at outpatient evaluation.   Lennette Bihari, MD, [facility] 02/06/2017 2:28 PM

## 2017-02-06 NOTE — Plan of Care (Signed)
Problem: Safety: Goal: Ability to remain free from injury will improve Outcome: Progressing No injuries this shift. Fall risk bundle implemented.   Problem: Pain Managment: Goal: General experience of comfort will improve Outcome: Progressing Pt complained of back pain. Prn medication given per order. Pt resting in bed comfortably at this time. No further complaints of pain. Will continue to monitor and assess pain.

## 2017-02-06 NOTE — Progress Notes (Signed)
CSW was consulted for transportation for patient. CSW met with patient at bedside and patient indicated he does not have someone to pick him up and cannot afford transportation home. CSW confirmed with nursing staff that patient is physically able to use public transportation. CSW provided patient's nurse with bus passes for patient to get home and go to follow up appointment post-discharge.  Omar Sandoval, [place]

## 2017-02-06 NOTE — Care Management Important Message (Signed)
Important Message  Patient Details  Name: Omar Sandoval MRN: [id number] Date of Birth: 11/11/1955   Medicare Important Message Given:  Yes    [name], [name] 02/06/2017, 9:25 AM

## 2017-02-06 NOTE — Progress Notes (Signed)
  Echocardiogram 2D Echocardiogram has been performed.  Omar Sandoval T [name] 02/06/2017, 1:05 PM

## 2017-02-11 ENCOUNTER — Telehealth (HOSPITAL_COMMUNITY): Payer: Self-pay

## 2017-02-11 NOTE — Telephone Encounter (Signed)
Patient insurances are active and benefits verified. Patient has [employer] - no co-payment, deductible $183.00/$183.00 has been met, out of pocket $6700/$650.50 has been met, 20% co-insurance, no pre-authorization and no limit on visit. Passport/reference [phone]. Medicaid - no co-payment, no deductible, no out of pocket, no co-insurance, no pre-authorization and no limit on visit. Passport/reference [phone].  Patient will be contacted and scheduled after their follow up appointment with the cardiologist office on 02/19/17, upon review by [facility] RN navigator.

## 2017-02-12 ENCOUNTER — Telehealth: Payer: Self-pay | Admitting: Interventional Cardiology

## 2017-02-12 MED ORDER — TICAGRELOR 90 MG PO TABS: 90.0000 mg | ORAL_TABLET | Freq: Two times a day (BID) | ORAL | 10 refills | Status: DC

## 2017-02-12 NOTE — Telephone Encounter (Signed)
Will route to Dr. Smith to make him aware. 

## 2017-02-12 NOTE — Telephone Encounter (Signed)
Received a call [facility] and he states that he had spoke with pt and pt told him he has not been taking Brilinta because it was not at his pharmacy.  Advised Dr. Maurice MarchBarroso I would speak with pt and see what was going on.  Spoke with [facility] on [address] and they said medication was never picked up.  Spoke with pt and he states that [facility] preferred pharmacy and picked up everything that was available.  He states he called them and told them he was missing the Brilinta and they told him they gave him everything that they had on file.  Advised pt that we did need him to take the Brilinta. Spoke with Aundra MilletMegan, [facility] and she said to have pt do a loading dose of 180mg  x 1 for initial dose, then change to Brilinta 90mg  BID.    Spoke with pt and went over instructions and advised I would [facility] [place].  Pt verbalized understanding and was appreciative for assistance.

## 2017-02-13 NOTE — Telephone Encounter (Signed)
Thanks

## 2017-02-19 ENCOUNTER — Encounter (INDEPENDENT_AMBULATORY_CARE_PROVIDER_SITE_OTHER): Payer: Self-pay

## 2017-02-19 ENCOUNTER — Ambulatory Visit (INDEPENDENT_AMBULATORY_CARE_PROVIDER_SITE_OTHER): Payer: Medicare HMO | Admitting: Nurse Practitioner

## 2017-02-19 ENCOUNTER — Encounter: Payer: Self-pay | Admitting: Nurse Practitioner

## 2017-02-19 VITALS — BP 112/68 | HR 78 | Ht 67.0 in | Wt 186.4 lb

## 2017-02-19 DIAGNOSIS — E7800 Pure hypercholesterolemia, unspecified: Secondary | ICD-10-CM | POA: Diagnosis not present

## 2017-02-19 DIAGNOSIS — I2119 ST elevation (STEMI) myocardial infarction involving other coronary artery of inferior wall: Secondary | ICD-10-CM | POA: Diagnosis not present

## 2017-02-19 DIAGNOSIS — Z955 Presence of coronary angioplasty implant and graft: Secondary | ICD-10-CM

## 2017-02-19 NOTE — Patient Instructions (Addendum)
We will be checking the following labs today - BMET, HPF and CBC   Medication Instructions:    Continue with your current medicines.     Testing/Procedures To Be Arranged:  N/A  Follow-Up:   See Dr. Katrinka BlazingSmith in August with fasting labs    Other Special Instructions:   N/A    If you need a refill on your cardiac medications before your next appointment, please call your pharmacy.   Call the [facility] office at [phone] if you have any questions, problems or concerns.

## 2017-02-19 NOTE — Progress Notes (Signed)
CARDIOLOGY OFFICE NOTE  Date:  02/19/2017    Omar Sandoval Date of Birth: 1955/09/29 Medical Record #[id number]  PCP:  Bartholomew Boards, MD  Cardiologist:  Katrinka Blazing  Chief Complaint  Patient presents with  . Coronary Artery Disease    Post hospital visit - seen for Dr. Katrinka Blazing    History of Present Illness: Omar Sandoval is a 61 y.o. male who presents today for a post hospital visit. Seen for Dr. Katrinka Blazing. This was to be a TOC - however, no phone call documented.   He is an HIV-positive gentleman who began having chest discomfort and presented to the emergency room earlier this month.  No prior history of heart disease. Smokes marijuana but not cigarettes. No history of bleeding, ulcer disease, stroke, previous myocardial infarction, heart failure, or other medical issues. EKG showed RBBB with ST elevation with Q waves in leads II, III and aVF. He was brought to the cath lab for emergent cardiac catheterization  He underwent LHC with Dr. Katrinka Blazing noted above with DES x1 placed to the [facility]. Plan for DAPT with ASA/Brilinta for at least one year. Also added low dose BB, and Lipitor 40mg  daily, as to not interact with his HIV therapy. Troponin peaked at >65, with EKG showing evolving TWI in inferior leads. LDL 101.   Worked well with cardiac rehab. No further c/o chest pain. Echo was done, and briefly viewed by Dr. Tresa Endo prior to discharge, but official read was pending. Unable to add ACEi/ARB due to soft blood pressures.   Phone call last week - "Received a call from Dr. Maurice March with [facility] and he states that he had spoke with pt and pt told him he has not been taking Brilinta because it was not at his pharmacy.  Advised Dr. Maurice March I would speak with pt and see what was going on.  Spoke with [facility] on [address] and they said medication was never picked up.  Spoke with pt and he states that he went to [place] at [facility], his preferred pharmacy and picked up everything that was  available.  He states he called them and told them he was missing the Brilinta and they told him they gave him everything that they had on file.  Advised pt that we did need him to take the Brilinta. Spoke with Aundra Millet, [facility] and she said to have pt do a loading dose of 180mg  x 1 for initial dose, then change to Brilinta 90mg  BID.    Spoke with pt and went over instructions and advised I would send medication to [facility].  Pt verbalized understanding and was appreciative for assistance."  Comes in today. Here with a child. He comes in playing music thru a speaker. Says he is doing ok. He is now on his Brilinita. No chest pain. Breathing is ok but sometimes has to "take a double breath". No swelling. Not dizzy. Taking his medicines and now on Brilinta as prescribed. He is walking. No real issues noted and he is happy with how he is doing.   Past Medical History:  Diagnosis Date  . HIV (human immunodeficiency virus infection) (HCC)   . Immune deficiency disorder [facility])     Past Surgical History:  Procedure Laterality Date  . COLONOSCOPY    . CORONARY/GRAFT ACUTE MI REVASCULARIZATION N/A 02/03/2017   Procedure: Coronary/Graft Acute MI Revascularization;  Surgeon: [name], MD;  Location: [facility] INVASIVE CV LAB;  Service: Cardiovascular;  Laterality: N/A;  .  FRACTURE SURGERY     left ankle and right foot  . HERNIA REPAIR    . LEFT HEART CATH AND CORONARY ANGIOGRAPHY N/A 02/03/2017   Procedure: Left Heart Cath and Coronary Angiography;  Surgeon: [name], MD;  Location: [facility] INVASIVE CV LAB;  Service: Cardiovascular;  Laterality: N/A;  . OPEN REDUCTION INTERNAL FIXATION (ORIF) DISTAL RADIAL FRACTURE Right 07/20/2016   Procedure: OPEN REDUCTION INTERNAL FIXATION (ORIF) DISTAL RADIAL FRACTURE;  Surgeon: [name], MD;  Location: [facility] OR;  Service: Orthopedics;  Laterality: Right;  . SURGERY SCROTAL / TESTICULAR     nondecended testes     Medications: Current Meds  Medication Sig    . Abacavir-Dolutegravir-Lamivud (TRIUMEQ) 600-50-300 MG TABS Take 1 tablet by mouth daily.   [name] aspirin 81 MG chewable tablet Chew 1 tablet (81 mg total) by mouth daily.  [name] atorvastatin (LIPITOR) 40 MG tablet Take 1 tablet (40 mg total) by mouth daily at 6 PM.  . metoprolol succinate (TOPROL-XL) 25 MG 24 hr tablet Take 0.5 tablets (12.5 mg total) by mouth daily.  . nitroGLYCERIN (NITROSTAT) 0.4 MG SL tablet Place 1 tablet (0.4 mg total) under the tongue every 5 (five) minutes as needed.  [name] oxyCODONE-acetaminophen (PERCOCET) 10-325 MG per tablet Take 0.5-1 tablets by mouth every 4 (four) hours as needed for pain. (Patient taking differently: Take 1 tablet by mouth every 4 (four) hours as needed for pain. )  . ticagrelor (BRILINTA) 90 MG TABS tablet Take 1 tablet (90 mg total) by mouth 2 (two) times daily.     Allergies: Allergies  Allergen Reactions  . Chocolate Diarrhea  . Lactose Intolerance (Gi) Diarrhea and Nausea And Vomiting  . Vicodin [Hydrocodone-Acetaminophen] Nausea Only and Other (See Comments)    GI Upset    Social History: The patient  reports that he has quit smoking. His smoking use included Cigarettes. He smoked 0.00 packs per day. He has never used smokeless tobacco. He reports that he drinks about 1.8 oz of alcohol per week . He reports that he uses drugs, including Marijuana.   Family History: The patient's family history includes Cancer in his brother and mother.   Review of Systems: Please see the history of present illness.   Otherwise, the review of systems is positive for none.   All other systems are reviewed and negative.   Physical Exam: VS:  BP 112/68 (BP Location: Left Arm, Patient Position: Sitting, Cuff Size: Large)   Pulse 78   Ht 5\' 7"  (1.702 m)   Wt 186 lb 6.4 oz (84.6 kg)   SpO2 100% Comment: at rest  BMI 29.19 kg/m  .  BMI Body mass index is 29.19 kg/m.  Wt Readings from Last 3 Encounters:  02/19/17 186 lb 6.4 oz (84.6 kg)  02/06/17 182 lb  3.2 oz (82.6 kg)  08/31/16 195 lb (88.5 kg)    General: Alert and in no acute distress.   HEENT: Normal.  Neck: Supple, no JVD, carotid bruits, or masses noted.  Cardiac: Regular rate and rhythm. No murmurs, rubs, or gallops. No edema.  Respiratory:  Lungs are clear to auscultation bilaterally with normal work of breathing.  GI: Soft and nontender.  MS: No deformity or atrophy. Gait and ROM intact.  Skin: Warm and dry. Color is normal.  Neuro:  Strength and sensation are intact and no gross focal deficits noted.  Psych: Alert, appropriate and with normal affect.   LABORATORY DATA:   EKG:  EKG is not ordered today.  Lab Results  Component Value Date   WBC 8.5 02/03/2017   HGB 13.3 02/03/2017   HCT 38.9 (L) 02/03/2017   PLT 323 02/03/2017   GLUCOSE 108 (H) 02/05/2017   CHOL 156 02/03/2017   TRIG 39 02/03/2017   HDL 47 02/03/2017   LDLCALC 101 (H) 02/03/2017   ALT 64 (H) 02/04/2017   AST 351 (H) 02/04/2017   NA 136 02/05/2017   K 3.7 02/05/2017   CL 101 02/05/2017   CREATININE 0.73 02/05/2017   BUN 6 02/05/2017   CO2 26 02/05/2017   TSH 1.268 02/03/2017   INR 0.96 02/03/2017   HGBA1C 5.5 02/04/2017     BNP (last 3 results)  Recent Labs  02/03/17 1323  BNP 83.2    ProBNP (last 3 results) No results for input(s): PROBNP in the last 8760 hours.   Other Studies Reviewed Today: LHC: 02/03/17  Conclusion    Acute inferior wall ST elevation MI, late presenting (greater than 12 hours) with ongoing pain.  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  Successful PCI and stenting of the mid RCA from 100% obstruction to less than 15% with TIMI grade 3 flow using a 16 x 3.5 mm Synergy DES. Postdilatation final diameter 4.0 mm at high pressure.  70% proximal to mid LAD.  60-70% distal circumflex.  Inferior wall severe hypokinesis. LVEF 40-50%. Elevated LVEDP.  RECOMMENDATIONS:   Dual antiplatelet therapy, aspirin and  Brilinta.  Low-dose beta blocker therapy, high intensity statin therapy, and additional therapy is required to control blood pressure.  IV nitroglycerin for 12-24 hours to help with blood pressure and also dilate the microcirculation beyond the stent.  Pharmacy to help [place] avoid interaction with HIV therapy.  Probable outpatient myocardial perfusion study to exclude LAD territory ischemia.   Echo Study Conclusions 01/2017  - Left ventricle: Septal mid and basal inferior wall hypokinesis.   The cavity size was normal. Wall thickness was increased in a   pattern of mild LVH. Systolic function was mildly reduced. The   estimated ejection fraction was in the range of 45% to 50%.   Doppler parameters are consistent with both elevated ventricular   end-diastolic filling pressure and elevated left atrial filling   pressure. - Mitral valve: There was mild regurgitation. - Atrial septum: No defect or patent foramen ovale was identified.   Assessment/Plan: 1. Inferior MI with PCI/DES stent to the RCA - mild LV dysfunction - doing well clinically. Recheck lab today. Discussed activity level. Continue with current regimen and encouraged CV risk factor modification.   2. Mild LV dysfunction - BP too soft for ACE/ARB - recheck by me only 100/60. No changes made today. Encouraged to restrict salt.   3. HLD - noted elevated LFTs - recheck today. Needs fasting labs on return  4. Elevated LFTs - suggested alcohol cessation  5. Smoking - likes marijuana - cessation encouraged.   Current medicines are reviewed with the patient today.  The patient does not have concerns regarding medicines other than what has been noted above.  The following changes have been made:  See above.  Labs/ tests ordered today include:    Orders Placed This Encounter  Procedures  . Basic metabolic panel  . CBC  . Hepatic function panel     Disposition:   FU with Dr. Katrinka Blazing with fasting labs in about 6 to 8 weeks.   i  Patient is agreeable to this plan and will call if any problems  develop in the interim.   SignedNorma Fredrickson: Jermani Pund, NP  02/19/2017 12:01 PM  [facility] [address] [place], [place]  [address] Phone: [phone]) [phone] Fax: [phone]) [phone]

## 2017-02-20 LAB — CBC
Hematocrit: 38.4 % (ref 37.5–51.0)
Hemoglobin: 12.5 g/dL — ABNORMAL LOW (ref 13.0–17.7)
MCH: 33.9 pg — ABNORMAL HIGH (ref 26.6–33.0)
MCHC: 32.6 g/dL (ref 31.5–35.7)
MCV: 104 fL — ABNORMAL HIGH (ref 79–97)
Platelets: 417 10*3/uL — ABNORMAL HIGH (ref 150–379)
RBC: 3.69 x10E6/uL — ABNORMAL LOW (ref 4.14–5.80)
RDW: 14.3 % (ref 12.3–15.4)
WBC: 4.7 10*3/uL (ref 3.4–10.8)

## 2017-02-20 LAB — BASIC METABOLIC PANEL
BUN/Creatinine Ratio: 8 — ABNORMAL LOW (ref 10–24)
BUN: 6 mg/dL — ABNORMAL LOW (ref 8–27)
CO2: 24 mmol/L (ref 20–29)
Calcium: 9.4 mg/dL (ref 8.6–10.2)
Chloride: 102 mmol/L (ref 96–106)
Creatinine, Ser: 0.79 mg/dL (ref 0.76–1.27)
GFR calc Af Amer: 113 mL/min/{1.73_m2} (ref >59)
GFR calc non Af Amer: 98 mL/min/{1.73_m2} (ref >59)
Glucose: 83 mg/dL (ref 65–99)
Potassium: 4.7 mmol/L (ref 3.5–5.2)
Sodium: 140 mmol/L (ref 134–144)

## 2017-02-20 LAB — HEPATIC FUNCTION PANEL
ALT: 16 IU/L (ref 0–44)
AST: 16 IU/L (ref 0–40)
Albumin: 4.1 g/dL (ref 3.6–4.8)
Alkaline Phosphatase: 82 IU/L (ref 39–117)
Bilirubin Total: 0.4 mg/dL (ref 0.0–1.2)
Bilirubin, Direct: 0.11 mg/dL (ref 0.00–0.40)
Total Protein: 6.7 g/dL (ref 6.0–8.5)

## 2017-02-28 ENCOUNTER — Encounter (HOSPITAL_COMMUNITY): Payer: Self-pay | Admitting: Interventional Cardiology

## 2017-02-28 ENCOUNTER — Encounter: Payer: Self-pay | Admitting: Interventional Cardiology

## 2017-02-28 DIAGNOSIS — I5042 Chronic combined systolic (congestive) and diastolic (congestive) heart failure: Secondary | ICD-10-CM | POA: Insufficient documentation

## 2017-03-09 ENCOUNTER — Telehealth (HOSPITAL_COMMUNITY): Payer: Self-pay | Admitting: Pharmacist

## 2017-03-09 NOTE — Telephone Encounter (Signed)
Cardiac Rehab Medication Review by a Pharmacist  Does the patient  feel that his/her medications are working for him/her?  yes  Has the patient been experiencing any side effects to the medications prescribed?  Yes - experiencing some shortness of breath since his cardiac event.  Does the patient measure his/her own blood pressure or blood glucose at home?  no   Does the patient have any problems obtaining medications due to transportation or finances?   no  Understanding of regimen: good Understanding of indications: good Potential of compliance: good  Pharmacist comments: He shared some concerns related to his broken glasses and his Medicaid copay.   Sallee Provencalurner, Deane Melick S 03/09/2017 4:31 PM

## 2017-03-11 ENCOUNTER — Encounter (HOSPITAL_COMMUNITY)
Admission: RE | Admit: 2017-03-11 | Discharge: 2017-03-11 | Disposition: A | Discharge status: Home / Self Care | Payer: Medicare HMO | Source: Ambulatory Visit | Attending: Interventional Cardiology | Admitting: Interventional Cardiology

## 2017-03-11 ENCOUNTER — Encounter (HOSPITAL_COMMUNITY): Payer: Self-pay

## 2017-03-11 VITALS — BP 102/60 | HR 75 | Ht 66.0 in | Wt 190.0 lb

## 2017-03-11 DIAGNOSIS — Z955 Presence of coronary angioplasty implant and graft: Secondary | ICD-10-CM

## 2017-03-11 DIAGNOSIS — I2111 ST elevation (STEMI) myocardial infarction involving right coronary artery: Secondary | ICD-10-CM | POA: Insufficient documentation

## 2017-03-11 DIAGNOSIS — I213 ST elevation (STEMI) myocardial infarction of unspecified site: Secondary | ICD-10-CM | POA: Diagnosis present

## 2017-03-11 NOTE — Progress Notes (Signed)
Macario Goldsaul R Laymon 61 y.o. male       Nutrition Screen & Note  1. 02/03/17 ST elevation myocardial infarction involving right coronary artery (HCC)   2. 02/03/17 Stented coronary artery    Past Medical History:  Diagnosis Date  . HIV (human immunodeficiency virus infection) (HCC)   . Immune deficiency disorder (HCC)    Meds reviewed.  HT: Ht Readings from Last 1 Encounters:  03/11/17 5\' 6"  (1.676 m)    WT: Wt Readings from Last 3 Encounters:  03/11/17 190 lb 0.6 oz (86.2 kg)  02/19/17 186 lb 6.4 oz (84.6 kg)  02/06/17 182 lb 3.2 oz (82.6 kg)     BMI 30.7   Current tobacco use? No, pt uses marijuana  Labs:  Lipid Panel     Component Value Date/Time   CHOL 156 02/03/2017 1323   TRIG 39 02/03/2017 1323   HDL 47 02/03/2017 1323   CHOLHDL 3.3 02/03/2017 1323   VLDL 8 02/03/2017 1323   LDLCALC 101 (H) 02/03/2017 1323    Lab Results  Component Value Date   HGBA1C 5.5 02/04/2017   CBG (last 3)  No results for input(s): GLUCAP in the last 72 hours.  Nutrition Note Spoke with pt. Nutrition plan and goals reviewed with pt. Pt is following Step 2 of the Therapeutic Lifestyle Changes diet. Pt wants to lose wt. Pt has not been actively trying to lose wt at this time. Pt expressed understanding of the information reviewed. Pt aware of nutrition education classes offered and plans on attending nutrition classes.  Nutrition Diagnosis ? Food-and nutrition-related knowledge deficit related to lack of exposure to information as related to diagnosis of: ? CVD ? 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 Pt's individual nutrition plan and goals reviewed with pt.  Nutrition Goal(s):  ? Pt to identify food quantities necessary to achieve weight loss of 6-25 lb at graduation from cardiac rehab. Goal wt of 175 lb desired.   Plan:  Pt to attend nutrition classes ? Nutrition I ? Nutrition II ? Portion Distortion  Will provide  client-centered nutrition education as part of interdisciplinary care.   Monitor and evaluate progress toward nutrition goal with team.  Mickle PlumbEdna Kamirah Shugrue, M.Ed, RD, LDN, CDE 03/11/2017 12:13 PM

## 2017-03-11 NOTE — Progress Notes (Signed)
Cardiac Individual Treatment Plan  Patient Details  Name: Omar Sandoval MRN: [id number] Date of Birth: 08/10/1956 Referring Provider:     CARDIAC REHAB PHASE II [facility]ATION [facility] 03[facility]11/2017 [facility]  Referring Provider  Verdis PrimeSmith, Henry      Initial Encounter Date:    CARDIAC REHAB PHASE II [facility]ATION [facility]  Date  03/11/17  Referring Provider  Verdis PrimeSmith, Henry      Visit Diagnosis: 02/03/17 ST elevation myocardial infarction involving right coronary artery (HCC)  02/03/17 Stented coronary artery  Patient's Home Medications on Admission:  Current Outpatient Prescriptions:  .  Abacavir-Dolutegravir-Lamivud (TRIUMEQ) 600-50-300 MG TABS, Take 1 tablet by mouth daily. , Disp: , Rfl:  .  aspirin 81 MG chewable tablet, Chew 1 tablet (81 mg total) by mouth daily., Disp: , Rfl:  .  atorvastatin (LIPITOR) 40 MG tablet, Take 1 tablet (40 mg total) by mouth daily at 6 PM., Disp: 30 tablet, Rfl: 3 .  metoprolol succinate (TOPROL-XL) 25 MG 24 hr tablet, Take 0.5 tablets (12.5 mg total) by mouth daily., Disp: 30 tablet, Rfl: 2 .  nitroGLYCERIN (NITROSTAT) 0.4 MG SL tablet, Place 1 tablet (0.4 mg total) under the tongue every 5 (five) minutes as needed., Disp: 25 tablet, Rfl: 3 .  oxyCODONE-acetaminophen (PERCOCET) 10-325 MG per tablet, Take 0.5-1 tablets by mouth every 4 (four) hours as needed for pain. (Patient taking differently: Take 1 tablet by mouth every 4 (four) hours as needed for pain. ), Disp: 10 tablet, Rfl: 0 .  ticagrelor (BRILINTA) 90 MG TABS tablet, Take 1 tablet (90 mg total) by mouth 2 (two) times daily., Disp: 60 tablet, Rfl: 10  Past Medical History: Past Medical History:  Diagnosis Date  . HIV (human immunodeficiency virus infection) (HCC)   . Immune deficiency disorder (HCC)     Tobacco Use: History  Smoking Status  . Former Smoker  . Packs/day: 0.00  . Types: Cigarettes  Smokeless  Tobacco  . Never Used    Labs: Recent Review Flowsheet Data    Labs for ITP Cardiac and Pulmonary Rehab Latest Ref Rng & Units 09/08/2008 03/28/2009 08/12/2014 02/03/2017 02/04/2017   Cholestrol 0 - 200 mg/dL - - - 308156 -   LDLCALC 0 - 99 mg/dL - - - 657(Q101(H) -   HDL >46>40 mg/dL - - - 47 -   Trlycerides <150 mg/dL - - - 39 -   Hemoglobin A1c 4.8 - 5.6 % - - - - 5.5   TCO2 0 - 100 mmol/L 29 29 25 29  -      Capillary Blood Glucose: No results found for: GLUCAP   Exercise Target Goals: Date: 03/11/17  Exercise Program Goal: Individual exercise prescription set with THRR, safety & activity barriers. Participant demonstrates ability to understand and report RPE using BORG scale, to self-measure pulse accurately, and to acknowledge the importance of the exercise prescription.  Exercise Prescription Goal: Starting with aerobic activity 30 plus minutes a day, 3 days per week for initial exercise prescription. Provide home exercise prescription and guidelines that participant acknowledges understanding prior to discharge.  Activity Barriers & Risk Stratification:     Activity Barriers & Cardiac Risk Stratification - 03/11/17 1014      Activity Barriers & Cardiac Risk Stratification   Activity Barriers Arthritis;Joint Problems;Deconditioning   Cardiac Risk Stratification High      6 Minute Walk:     6 Minute Walk    Row Name  03/11/17 1013         6 Minute Walk   Phase Initial     Distance 1514 feet     Walk Time 6 minutes     # of Rest Breaks 0     MPH 2.87     METS 3.57     RPE 9     VO2 Peak 12.49     Symptoms No     Resting HR 75 bpm     Resting BP 102/60     Max Ex. HR 105 bpm     Max Ex. BP 118/72     2 Minute Post BP 106/60        Oxygen Initial Assessment:   Oxygen Re-Evaluation:   Oxygen Discharge (Final Oxygen Re-Evaluation):   Initial Exercise Prescription:     Initial Exercise Prescription - 03/11/17 1000      Date of Initial Exercise RX and  Referring Provider   Date 03/11/17   Referring Provider Verdis Prime     Treadmill   MPH 2.8   Grade 0   Minutes 10   METs 3.14     Bike   Level 0.8   Minutes 10   METs 2.78     NuStep   Level 3   SPM 80   Minutes 10   METs 2.4     Prescription Details   Frequency (times per week) 3   Duration Progress to 30 minutes of continuous aerobic without signs/symptoms of physical distress     Intensity   THRR 40-80% of Max Heartrate 64-128   Ratings of Perceived Exertion 11-15   Perceived Dyspnea 0-4     Progression   Progression Continue to progress workloads to maintain intensity without signs/symptoms of physical distress.     Resistance Training   Training Prescription Yes   Weight 3lbs   Reps 10-15      Perform Capillary Blood Glucose checks as needed.  Exercise Prescription Changes:   Exercise Comments:   Exercise Goals and Review:     Exercise Goals    Row Name 03/11/17 1015             Exercise Goals   Increase Physical Activity Yes       Intervention Provide advice, education, support and counseling about physical activity/exercise needs.;Develop an individualized exercise prescription for aerobic and resistive training based on initial evaluation findings, risk stratification, comorbidities and participant's personal goals.       Expected Outcomes Achievement of increased cardiorespiratory fitness and enhanced flexibility, muscular endurance and strength shown through measurements of functional capacity and personal statement of participant.       Increase Strength and Stamina Yes       Intervention Provide advice, education, support and counseling about physical activity/exercise needs.;Develop an individualized exercise prescription for aerobic and resistive training based on initial evaluation findings, risk stratification, comorbidities and participant's personal goals.       Expected Outcomes Achievement of increased cardiorespiratory fitness and  enhanced flexibility, muscular endurance and strength shown through measurements of functional capacity and personal statement of participant.          Exercise Goals Re-Evaluation :    Discharge Exercise Prescription (Final Exercise Prescription Changes):   Nutrition:  Target Goals: Understanding of nutrition guidelines, daily intake of sodium 1500mg , cholesterol 200mg , calories 30% from fat and 7% or less from saturated fats, daily to have 5 or more servings of fruits and vegetables.  Biometrics:  Pre Biometrics - 03/11/17 1015      Pre Biometrics   Waist Circumference 37.5 inches   Hip Circumference 42.5 inches   Waist to Hip Ratio 0.88 %   Triceps Skinfold 22 mm   % Body Fat 29 %   Grip Strength 30 kg   Flexibility 15 in   Single Leg Stand 24 seconds       Nutrition Therapy Plan and Nutrition Goals:     Nutrition Therapy & Goals - 03/11/17 1218      Nutrition Therapy   Diet Therapeutic Lifestyle Changes     Personal Nutrition Goals   Nutrition Goal Wt loss of 1-2 lb/week to a wt loss goal of 6-24 lb at graduation. Pt wt loss goal wt is 175 lb.      Intervention Plan   Intervention Prescribe, educate and counsel regarding individualized specific dietary modifications aiming towards targeted core components such as weight, hypertension, lipid management, diabetes, heart failure and other comorbidities.   Expected Outcomes Short Term Goal: Understand basic principles of dietary content, such as calories, fat, sodium, cholesterol and nutrients.;Long Term Goal: Adherence to prescribed nutrition plan.      Nutrition Discharge: Nutrition Scores:     Nutrition Assessments - 03/11/17 1218      MEDFICTS Scores   Pre Score 28      Nutrition Goals Re-Evaluation:   Nutrition Goals Re-Evaluation:   Nutrition Goals Discharge (Final Nutrition Goals Re-Evaluation):   Psychosocial: Target Goals: Acknowledge presence or absence of significant depression  and/or stress, maximize coping skills, provide positive support system. Participant is able to verbalize types and ability to use techniques and skills needed for reducing stress and depression.  Initial Review & Psychosocial Screening:     Initial Psych Review & Screening - 03/11/17 1306      Initial Review   Current issues with Current Stress Concerns;Current Anxiety/Panic   Source of Stress Concerns Family   Comments Pt with unhealthy coping skills with drinking beer and smoking marijuana      Family Dynamics   Good Support System? Yes   Comments supportive family - sister      Quality of Life Scores:     Quality of Life - 03/11/17 1059      Quality of Life Scores   Health/Function Pre 27.6 %   Socioeconomic Pre 23.25 %   Psych/Spiritual Pre 30 %   Family Pre 26.1 %   GLOBAL Pre 27.09 %      PHQ-9: Recent Review Flowsheet Data    There is no flowsheet data to display.     Interpretation of Total Score  Total Score Depression Severity:  1-4 = Minimal depression, 5-9 = Mild depression, 10-14 = Moderate depression, 15-19 = Moderately severe depression, 20-27 = Severe depression   Psychosocial Evaluation and Intervention:   Psychosocial Re-Evaluation:   Psychosocial Discharge (Final Psychosocial Re-Evaluation):   Vocational Rehabilitation: Provide vocational rehab assistance to qualifying candidates.   Vocational Rehab Evaluation & Intervention:     Vocational Rehab - 03/11/17 1311      Initial Vocational Rehab Evaluation & Intervention   Assessment shows need for Vocational Rehabilitation Yes      Education: Education Goals: Education classes will be provided on a weekly basis, covering required topics. Participant will state understanding/return demonstration of topics presented.  Learning Barriers/Preferences:     Learning Barriers/Preferences - 03/11/17 1058      Learning Barriers/Preferences   Learning Barriers Sight  need glasses  Learning Preferences Written Material;Video;Verbal Instruction;Skilled Demonstration;Pictoral      Education Topics: Count Your Pulse:  -Group instruction provided by verbal instruction, demonstration, patient participation and written materials to support subject.  Instructors address importance of being able to find your pulse and how to count your pulse when at home without a heart monitor.  Patients get hands on experience counting their pulse with staff help and individually.   Heart Attack, Angina, and Risk Factor Modification:  -Group instruction provided by verbal instruction, video, and written materials to support subject.  Instructors address signs and symptoms of angina and heart attacks.    Also discuss risk factors for heart disease and how to make changes to improve heart health risk factors.   Functional Fitness:  -Group instruction provided by verbal instruction, demonstration, patient participation, and written materials to support subject.  Instructors address safety measures for doing things around the house.  Discuss how to get up and down off the floor, how to pick things up properly, how to safely get out of a chair without assistance, and balance training.   Meditation and Mindfulness:  -Group instruction provided by verbal instruction, patient participation, and written materials to support subject.  Instructor addresses importance of mindfulness and meditation practice to help reduce stress and improve awareness.  Instructor also leads participants through a meditation exercise.    Stretching for Flexibility and Mobility:  -Group instruction provided by verbal instruction, patient participation, and written materials to support subject.  Instructors lead participants through series of stretches that are designed to increase flexibility thus improving mobility.  These stretches are additional exercise for major muscle groups that are typically performed during regular  warm up and cool down.   Hands Only CPR:  -Group verbal, video, and participation provides a basic overview of AHA guidelines for community CPR. Role-play of emergencies allow participants the opportunity to practice calling for help and chest compression technique with discussion of AED use.   Hypertension: -Group verbal and written instruction that provides a basic overview of hypertension including the most recent diagnostic guidelines, risk factor reduction with self-care instructions and medication management.    Nutrition I class: Heart Healthy Eating:  -Group instruction provided by PowerPoint slides, verbal discussion, and written materials to support subject matter. The instructor gives an explanation and review of the Therapeutic Lifestyle Changes diet recommendations, which includes a discussion on lipid goals, dietary fat, sodium, fiber, plant stanol/sterol esters, sugar, and the components of a well-balanced, healthy diet.   Nutrition II class: Lifestyle Skills:  -Group instruction provided by PowerPoint slides, verbal discussion, and written materials to support subject matter. The instructor gives an explanation and review of label reading, grocery shopping for heart health, heart healthy recipe modifications, and ways to make healthier choices when eating out.   Diabetes Question & Answer:  -Group instruction provided by PowerPoint slides, verbal discussion, and written materials to support subject matter. The instructor gives an explanation and review of diabetes co-morbidities, pre- and post-prandial blood glucose goals, pre-exercise blood glucose goals, signs, symptoms, and treatment of hypoglycemia and hyperglycemia, and foot care basics.   Diabetes Blitz:  -Group instruction provided by PowerPoint slides, verbal discussion, and written materials to support subject matter. The instructor gives an explanation and review of the physiology behind type 1 and type 2 diabetes,  diabetes medications and rational behind using different medications, pre- and post-prandial blood glucose recommendations and Hemoglobin A1c goals, diabetes diet, and exercise including blood glucose guidelines for exercising safely.  Portion Distortion:  -Group instruction provided by PowerPoint slides, verbal discussion, written materials, and food models to support subject matter. The instructor gives an explanation of serving size versus portion size, changes in portions sizes over the last 20 years, and what consists of a serving from each food group.   Stress Management:  -Group instruction provided by verbal instruction, video, and written materials to support subject matter.  Instructors review role of stress in heart disease and how to cope with stress positively.     Exercising on Your Own:  -Group instruction provided by verbal instruction, power point, and written materials to support subject.  Instructors discuss benefits of exercise, components of exercise, frequency and intensity of exercise, and end points for exercise.  Also discuss use of nitroglycerin and activating EMS.  Review options of places to exercise outside of rehab.  Review guidelines for sex with heart disease.   Cardiac Drugs I:  -Group instruction provided by verbal instruction and written materials to support subject.  Instructor reviews cardiac drug classes: antiplatelets, anticoagulants, beta blockers, and statins.  Instructor discusses reasons, side effects, and lifestyle considerations for each drug class.   Cardiac Drugs II:  -Group instruction provided by verbal instruction and written materials to support subject.  Instructor reviews cardiac drug classes: angiotensin converting enzyme inhibitors (ACE-I), angiotensin II receptor blockers (ARBs), nitrates, and calcium channel blockers.  Instructor discusses reasons, side effects, and lifestyle considerations for each drug class.   Anatomy and Physiology  of the Circulatory System:  Group verbal and written instruction and models provide basic cardiac anatomy and physiology, with the coronary electrical and arterial systems. Review of: AMI, Angina, Valve disease, Heart Failure, Peripheral Artery Disease, Cardiac Arrhythmia, Pacemakers, and the ICD.   Other Education:  -Group or individual verbal, written, or video instructions that support the educational goals of the cardiac rehab program.   Knowledge Questionnaire Score:     Knowledge Questionnaire Score - 03/11/17 1057      Knowledge Questionnaire Score   Pre Score 21/28      Core Components/Risk Factors/Patient Goals at Admission:     Personal Goals and Risk Factors at Admission - 03/11/17 1015      Core Components/Risk Factors/Patient Goals on Admission    Weight Management Yes;Obesity;Weight Loss   Intervention Weight Management: Develop a combined nutrition and exercise program designed to reach desired caloric intake, while maintaining appropriate intake of nutrient and fiber, sodium and fats, and appropriate energy expenditure required for the weight goal.;Weight Management: Provide education and appropriate resources to help participant work on and attain dietary goals.;Weight Management/Obesity: Establish reasonable short term and long term weight goals.;Obesity: Provide education and appropriate resources to help participant work on and attain dietary goals.   Admit Weight 190 lb 0.6 oz (86.2 kg)   Goal Weight: Short Term 184 lb (83.5 kg)   Goal Weight: Long Term 175 lb (79.4 kg)   Expected Outcomes Short Term: Continue to assess and modify interventions until short term weight is achieved;Long Term: Adherence to nutrition and physical activity/exercise program aimed toward attainment of established weight goal;Weight Loss: Understanding of general recommendations for a balanced deficit meal plan, which promotes 1-2 lb weight loss per week and includes a negative energy  balance of [phone] kcal/d;Understanding recommendations for meals to include 15-35% energy as protein, 25-35% energy from fat, 35-60% energy from carbohydrates, less than 200mg  of dietary cholesterol, 20-35 gm of total fiber daily;Understanding of distribution of calorie intake throughout the day with the consumption  of 4-5 meals/snacks   Lipids Yes   Intervention Provide education and support for participant on nutrition & aerobic/resistive exercise along with prescribed medications to achieve LDL 70mg , HDL >40mg .   Expected Outcomes Short Term: Participant states understanding of desired cholesterol values and is compliant with medications prescribed. Participant is following exercise prescription and nutrition guidelines.;Long Term: Cholesterol controlled with medications as prescribed, with individualized exercise RX and with personalized nutrition plan. Value goals: LDL < 70mg , HDL > 40 mg.      Core Components/Risk Factors/Patient Goals Review:    Core Components/Risk Factors/Patient Goals at Discharge (Final Review):    ITP Comments:     ITP Comments    Row Name 03/11/17 1008           ITP Comments Medical Director, Dr. Armanda Magic          Comments:  Patient attended orientation from 0830 to 1015 to review rules and guidelines for program. Completed 6 minute walk test, Intitial ITP, and exercise prescription.  VSS. Telemetry-SR with no noted ectopy, pt denies any symptoms.  Pt is accompanied today with 2 children age 50 and 3.  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  Brief psychosocial assessment show low issues with stress and anxiety regarding his family. Pt has a large family but most of his siblings have passed away. Pt would benefit from counseling for positive coping skills instead of smoking marijuana and drinking beers.  Pt is open to this but feels he is not harming himself.  Pt is looking  forward to coming to exercise in the rehab program. Karlene Lineman RN, BSN Cardiac and Pulmonary Rehab Nurse Navigator

## 2017-03-17 ENCOUNTER — Encounter (HOSPITAL_COMMUNITY): Payer: Self-pay

## 2017-03-17 ENCOUNTER — Encounter (HOSPITAL_COMMUNITY): Payer: Medicare HMO

## 2017-03-19 ENCOUNTER — Encounter (HOSPITAL_COMMUNITY): Admission: RE | Admit: 2017-03-19 | Payer: Medicare HMO | Source: Ambulatory Visit

## 2017-03-21 ENCOUNTER — Encounter (HOSPITAL_COMMUNITY): Payer: Medicare HMO

## 2017-03-24 ENCOUNTER — Encounter (HOSPITAL_COMMUNITY): Payer: Medicare HMO

## 2017-03-26 ENCOUNTER — Encounter (HOSPITAL_COMMUNITY): Admission: RE | Admit: 2017-03-26 | Payer: Medicare HMO | Source: Ambulatory Visit

## 2017-03-28 ENCOUNTER — Encounter (HOSPITAL_COMMUNITY): Payer: Medicare HMO

## 2017-03-31 ENCOUNTER — Encounter (HOSPITAL_COMMUNITY): Payer: Medicare HMO

## 2017-04-02 ENCOUNTER — Encounter (HOSPITAL_COMMUNITY): Payer: Medicare HMO

## 2017-04-03 NOTE — Progress Notes (Signed)
Cardiology Office Note    Date:  04/04/2017   ID:  Jupiter, Kabir 1956-07-17, MRN [id number]  PCP:  Bartholomew Boards, MD  Cardiologist: Lesleigh Noe, MD   Chief Complaint  Patient presents with  . Coronary Artery Disease    History of Present Illness:  Omar Sandoval is a 61 y.o. male with HIV positive syndrome on retroviral therapy, and recent acute coronary syndrome with distal RCA stent implantation June 2018  He has not begun cardiac rehabilitation. Has not had chest discomfort. He feels occasional palpitations that seem to be eased after he takes metoprolol. He denies dyspnea. No nitroglycerin use. He is not smoking. There is no history of diabetes.  Past Medical History:  Diagnosis Date  . HIV (human immunodeficiency virus infection) (HCC)   . Immune deficiency disorder [facility])     Past Surgical History:  Procedure Laterality Date  . COLONOSCOPY    . CORONARY/GRAFT ACUTE MI REVASCULARIZATION N/A 02/03/2017   Procedure: Coronary/Graft Acute MI Revascularization;  Surgeon: [name], MD;  Location: [facility] INVASIVE CV LAB;  Service: Cardiovascular;  Laterality: N/A;  . FRACTURE SURGERY     left ankle and right foot  . HERNIA REPAIR    . LEFT HEART CATH AND CORONARY ANGIOGRAPHY N/A 02/03/2017   Procedure: Left Heart Cath and Coronary Angiography;  Surgeon: [name], MD;  Location: [facility] INVASIVE CV LAB;  Service: Cardiovascular;  Laterality: N/A;  . OPEN REDUCTION INTERNAL FIXATION (ORIF) DISTAL RADIAL FRACTURE Right 07/20/2016   Procedure: OPEN REDUCTION INTERNAL FIXATION (ORIF) DISTAL RADIAL FRACTURE;  Surgeon: [name], MD;  Location: [facility] OR;  Service: Orthopedics;  Laterality: Right;  . SURGERY SCROTAL / TESTICULAR     nondecended testes    Current Medications: Outpatient Medications Prior to Visit  Medication Sig Dispense Refill  . Abacavir-Dolutegravir-Lamivud (TRIUMEQ) 600-50-300 MG TABS Take 1 tablet by mouth daily.     [name] aspirin 81 MG chewable tablet  Chew 1 tablet (81 mg total) by mouth daily.    [name] atorvastatin (LIPITOR) 40 MG tablet Take 1 tablet (40 mg total) by mouth daily at 6 PM. 30 tablet 3  . nitroGLYCERIN (NITROSTAT) 0.4 MG SL tablet Place 1 tablet (0.4 mg total) under the tongue every 5 (five) minutes as needed. 25 tablet 3  . oxyCODONE-acetaminophen (PERCOCET) 10-325 MG per tablet Take 0.5-1 tablets by mouth every 4 (four) hours as needed for pain. (Patient taking differently: Take 1 tablet by mouth every 4 (four) hours as needed for pain. ) 10 tablet 0  . ticagrelor (BRILINTA) 90 MG TABS tablet Take 1 tablet (90 mg total) by mouth 2 (two) times daily. 60 tablet 10  . metoprolol succinate (TOPROL-XL) 25 MG 24 hr tablet Take 0.5 tablets (12.5 mg total) by mouth daily. 30 tablet 2   No facility-administered medications prior to visit.      Allergies:   Chocolate; Lactose intolerance (gi); and Vicodin [hydrocodone-acetaminophen]   Social History   Social History  . Marital status: Single    Spouse name: N/A  . Number of children: N/A  . Years of education: N/A   Social History Main Topics  . Smoking status: Former Smoker    Packs/day: 0.00    Types: Cigarettes  . Smokeless tobacco: Never Used  . Alcohol use 1.8 oz/week    3 Cans of beer per week     Comment: beer daily  . Drug use: Yes    Types: Marijuana  .  Sexual activity: Not Asked   Other Topics Concern  . None   Social History Narrative  . None     Family History:  The patient's family history includes Cancer in his brother and mother.   ROS:   Please see the history of present illness.    No post-cath complications.  All other systems reviewed and are negative.   PHYSICAL EXAM:   VS:  BP 102/66 (BP Location: Right Arm)   Pulse 71   Ht 5\' 9"  (1.753 m)   Wt 182 lb 9.6 oz (82.8 kg)   BMI 26.97 kg/m    GEN: Well nourished, well developed, in no acute distress  HEENT: normal  Neck: no JVD, carotid bruits, or masses Cardiac: RRR; no murmurs, rubs,  or gallops,no edema  Respiratory:  clear to auscultation bilaterally, normal work of breathing GI: soft, nontender, nondistended, + BS MS: no deformity or atrophy  Skin: warm and dry, no rash Neuro:  Alert and Oriented x 3, Strength and sensation are intact Psych: euthymic mood, full affect  Wt Readings from Last 3 Encounters:  04/04/17 182 lb 9.6 oz (82.8 kg)  03/11/17 190 lb 0.6 oz (86.2 kg)  02/19/17 186 lb 6.4 oz (84.6 kg)      Studies/Labs Reviewed:   EKG:  EKG  Not repeated  Recent Labs: 02/03/2017: B Natriuretic Peptide 83.2; TSH 1.268 02/19/2017: ALT 16; BUN 6; Creatinine, Ser 0.79; Hemoglobin 12.5; Platelets 417; Potassium 4.7; Sodium 140   Lipid Panel    Component Value Date/Time   CHOL 156 02/03/2017 1323   TRIG 39 02/03/2017 1323   HDL 47 02/03/2017 1323   CHOLHDL 3.3 02/03/2017 1323   VLDL 8 02/03/2017 1323   LDLCALC 101 (H) 02/03/2017 1323    Additional studies/ records that were reviewed today include:  Cardiac Cath 01/2017: Diagnostic Diagram       Post-Intervention Diagram         2-D Doppler echocardiogram 02/06/2017: Study Conclusions  - Left ventricle: Septal mid and basal inferior wall hypokinesis.   The cavity size was normal. Wall thickness was increased in a   pattern of mild LVH. Systolic function was mildly reduced. The   estimated ejection fraction was in the range of 45% to 50%.   Doppler parameters are consistent with both elevated ventricular   end-diastolic filling pressure and elevated left atrial filling   pressure. - Mitral valve: There was mild regurgitation. - Atrial septum: No defect or patent foramen ovale was identified.  ASSESSMENT:    1. Coronary artery disease of native artery of native heart with stable angina pectoris (HCC)   2. Acute on chronic combined systolic and diastolic CHF (congestive heart failure) (HCC)   3. Other hyperlipidemia   4. HIV disease (HCC)      PLAN:  In order of problems listed  above:  1. He is doing well. He has successful stenting of the mid right coronary doing acute infarction. He is compliant with aspirin and Brilinta therapy. He's had no recurrent angina. He understands the importance of dual antiplatelet therapy. We spent considerable time discussing aggressive guideline directed medical therapy for both prevention of recurrent ischemic events and prolongation of life. LDL less than 70, blood pressure less than 130/90 mmHg, hemoglobin A1c less than 6.5, and the benefits of aerobic exercise. He does not smoke. 2. LVEF is mildly depressed. He has no symptoms of heart failure. Continue beta blocker. Consider adding ARB/ACE. We will repeat an echocardiogram for the city  and of the year and if LV function is not back in normal range, angiotensin blockade will be instituted. 3. LDL target less than 70. Liver lipid panel will be done today. 4. Not addressed  Enroll in cardiac rehabilitation. Discussed this in length.  Prolonged office visit with greater than 50% of the time spent discussing the natural history of CAD, prognosis, and risk factor modification.    Medication Adjustments/Labs and Tests Ordered: Current medicines are reviewed at length with the patient today.  Concerns regarding medicines are outlined above.  Medication changes, Labs and Tests ordered today are listed in the Patient Instructions below. Patient Instructions  Medication Instructions:  1) INCREASE Metoprolol Succinate to 12.5mg  twice daily  Labwork: None  Testing/Procedures: None  Follow-Up: Your physician recommends that you schedule a follow-up appointment in: 3 months with Dr. Katrinka Blazing.    Any Other Special Instructions Will Be Listed Below (If Applicable).  Please make sure you start attending Cardiac Rehab.   If you need a refill on your cardiac medications before your next appointment, please call your pharmacy.      Signed, Lesleigh Noe, MD  04/04/2017 12:52 PM     [facility] [address] [place], [place], [place]  [address] Phone: [phone]; Fax: [phone]

## 2017-04-04 ENCOUNTER — Ambulatory Visit (INDEPENDENT_AMBULATORY_CARE_PROVIDER_SITE_OTHER): Payer: Medicare HMO | Admitting: Interventional Cardiology

## 2017-04-04 ENCOUNTER — Encounter: Payer: Self-pay | Admitting: Interventional Cardiology

## 2017-04-04 ENCOUNTER — Encounter (HOSPITAL_COMMUNITY): Admission: RE | Admit: 2017-04-04 | Payer: Medicare HMO | Source: Ambulatory Visit

## 2017-04-04 VITALS — BP 102/66 | HR 71 | Ht 69.0 in | Wt 182.6 lb

## 2017-04-04 DIAGNOSIS — I25118 Atherosclerotic heart disease of native coronary artery with other forms of angina pectoris: Secondary | ICD-10-CM

## 2017-04-04 DIAGNOSIS — I5043 Acute on chronic combined systolic (congestive) and diastolic (congestive) heart failure: Secondary | ICD-10-CM

## 2017-04-04 DIAGNOSIS — B20 Human immunodeficiency virus [HIV] disease: Secondary | ICD-10-CM

## 2017-04-04 DIAGNOSIS — E784 Other hyperlipidemia: Secondary | ICD-10-CM | POA: Diagnosis not present

## 2017-04-04 DIAGNOSIS — E7849 Other hyperlipidemia: Secondary | ICD-10-CM

## 2017-04-04 MED ORDER — METOPROLOL SUCCINATE ER 25 MG PO TB24: 12.5000 mg | ORAL_TABLET | Freq: Every day | ORAL | 2 refills | Status: DC

## 2017-04-04 MED ORDER — METOPROLOL SUCCINATE ER 25 MG PO TB24: 12.5000 mg | ORAL_TABLET | Freq: Two times a day (BID) | ORAL | 2 refills | Status: DC

## 2017-04-04 NOTE — Addendum Note (Signed)
Addended by: Julio SicksBOWERS, JENNIFER L on: 04/04/2017 05:39 PM   Modules accepted: Orders

## 2017-04-04 NOTE — Patient Instructions (Signed)
Medication Instructions:  1) INCREASE Metoprolol Succinate to 12.5mg  twice daily  Labwork: None  Testing/Procedures: None  Follow-Up: Your physician recommends that you schedule a follow-up appointment in: 3 months with Dr. Katrinka BlazingSmith.    Any Other Special Instructions Will Be Listed Below (If Applicable).  Please make sure you start attending Cardiac Rehab.   If you need a refill on your cardiac medications before your next appointment, please call your pharmacy.

## 2017-04-07 ENCOUNTER — Encounter (HOSPITAL_COMMUNITY)
Admission: RE | Admit: 2017-04-07 | Discharge: 2017-04-07 | Disposition: A | Discharge status: Home / Self Care | Payer: Medicare HMO | Source: Ambulatory Visit | Attending: Interventional Cardiology | Admitting: Interventional Cardiology

## 2017-04-07 ENCOUNTER — Telehealth: Payer: Self-pay | Admitting: Interventional Cardiology

## 2017-04-07 DIAGNOSIS — Z955 Presence of coronary angioplasty implant and graft: Secondary | ICD-10-CM | POA: Insufficient documentation

## 2017-04-07 DIAGNOSIS — I2111 ST elevation (STEMI) myocardial infarction involving right coronary artery: Secondary | ICD-10-CM | POA: Insufficient documentation

## 2017-04-07 NOTE — Progress Notes (Signed)
Cardiac Individual Treatment Plan  Patient Details  Name: Omar Sandoval MRN: [id number] Date of Birth: 01/20/1956 Referring Provider:     CARDIAC REHAB PHASE II [facility]  Referring Provider  Verdis PrimeSmith, Henry      Initial Encounter Date:    CARDIAC REHAB PHASE II ORIENTATION [facility]2017 [facility]  Date  03/11/17  Referring Provider  Verdis PrimeSmith, Henry      Visit Diagnosis: 02/03/17 ST elevation myocardial infarction involving right coronary artery (HCC)  02/03/17 Stented coronary artery  Patient's Home Medications on Admission:  Current Outpatient Prescriptions:  .  Abacavir-Dolutegravir-Lamivud (TRIUMEQ) 600-50-300 MG TABS, Take 1 tablet by mouth daily. , Disp: , Rfl:  .  aspirin 81 MG chewable tablet, Chew 1 tablet (81 mg total) by mouth daily., Disp: , Rfl:  .  atorvastatin (LIPITOR) 40 MG tablet, Take 1 tablet (40 mg total) by mouth daily at 6 PM., Disp: 30 tablet, Rfl: 3 .  metoprolol succinate (TOPROL-XL) 25 MG 24 hr tablet, Take 0.5 tablets (12.5 mg total) by mouth 2 (two) times daily., Disp: 90 tablet, Rfl: 2 .  nitroGLYCERIN (NITROSTAT) 0.4 MG SL tablet, Place 1 tablet (0.4 mg total) under the tongue every 5 (five) minutes as needed., Disp: 25 tablet, Rfl: 3 .  oxyCODONE-acetaminophen (PERCOCET) 10-325 MG per tablet, Take 0.5-1 tablets by mouth every 4 (four) hours as needed for pain. (Patient taking differently: Take 1 tablet by mouth every 4 (four) hours as needed for pain. ), Disp: 10 tablet, Rfl: 0 .  ticagrelor (BRILINTA) 90 MG TABS tablet, Take 1 tablet (90 mg total) by mouth 2 (two) times daily., Disp: 60 tablet, Rfl: 10  Past Medical History: Past Medical History:  Diagnosis Date  . HIV (human immunodeficiency virus infection) (HCC)   . Immune deficiency disorder (HCC)     Tobacco Use: History  Smoking Status  . Former Smoker  . Packs/day: 0.00  . Types: Cigarettes   Smokeless Tobacco  . Never Used    Labs: Recent Review Flowsheet Data    Labs for ITP Cardiac and Pulmonary Rehab Latest Ref Rng & Units 09/08/2008 03/28/2009 08/12/2014 02/03/2017 02/04/2017   Cholestrol 0 - 200 mg/dL - - - 409156 -   LDLCALC 0 - 99 mg/dL - - - 811(B101(H) -   HDL >14>40 mg/dL - - - 47 -   Trlycerides <150 mg/dL - - - 39 -   Hemoglobin A1c 4.8 - 5.6 % - - - - 5.5   TCO2 0 - 100 mmol/L 29 29 25 29  -      Capillary Blood Glucose: No results found for: GLUCAP   Exercise Target Goals:    Exercise Program Goal: Individual exercise prescription set with THRR, safety & activity barriers. Participant demonstrates ability to understand and report RPE using BORG scale, to self-measure pulse accurately, and to acknowledge the importance of the exercise prescription.  Exercise Prescription Goal: Starting with aerobic activity 30 plus minutes a day, 3 days per week for initial exercise prescription. Provide home exercise prescription and guidelines that participant acknowledges understanding prior to discharge.  Activity Barriers & Risk Stratification:     Activity Barriers & Cardiac Risk Stratification - 03/11/17 1014      Activity Barriers & Cardiac Risk Stratification   Activity Barriers Arthritis;Joint Problems;Deconditioning   Cardiac Risk Stratification High      6 Minute Walk:     6 Minute Walk  Row Name 03/11/17 1013         6 Minute Walk   Phase Initial     Distance 1514 feet     Walk Time 6 minutes     # of Rest Breaks 0     MPH 2.87     METS 3.57     RPE 9     VO2 Peak 12.49     Symptoms No     Resting HR 75 bpm     Resting BP 102/60     Max Ex. HR 105 bpm     Max Ex. BP 118/72     2 Minute Post BP 106/60        Oxygen Initial Assessment:   Oxygen Re-Evaluation:   Oxygen Discharge (Final Oxygen Re-Evaluation):   Initial Exercise Prescription:     Initial Exercise Prescription - 03/11/17 1000      Date of Initial Exercise RX and  Referring Provider   Date 03/11/17   Referring Provider Verdis Prime     Treadmill   MPH 2.8   Grade 0   Minutes 10   METs 3.14     Bike   Level 0.8   Minutes 10   METs 2.78     NuStep   Level 3   SPM 80   Minutes 10   METs 2.4     Prescription Details   Frequency (times per week) 3   Duration Progress to 30 minutes of continuous aerobic without signs/symptoms of physical distress     Intensity   THRR 40-80% of Max Heartrate 64-128   Ratings of Perceived Exertion 11-15   Perceived Dyspnea 0-4     Progression   Progression Continue to progress workloads to maintain intensity without signs/symptoms of physical distress.     Resistance Training   Training Prescription Yes   Weight 3lbs   Reps 10-15      Perform Capillary Blood Glucose checks as needed.  Exercise Prescription Changes:   Exercise Comments:   Exercise Goals and Review:      Exercise Goals    Row Name 03/11/17 1015             Exercise Goals   Increase Physical Activity Yes       Intervention Provide advice, education, support and counseling about physical activity/exercise needs.;Develop an individualized exercise prescription for aerobic and resistive training based on initial evaluation findings, risk stratification, comorbidities and participant's personal goals.       Expected Outcomes Achievement of increased cardiorespiratory fitness and enhanced flexibility, muscular endurance and strength shown through measurements of functional capacity and personal statement of participant.       Increase Strength and Stamina Yes       Intervention Provide advice, education, support and counseling about physical activity/exercise needs.;Develop an individualized exercise prescription for aerobic and resistive training based on initial evaluation findings, risk stratification, comorbidities and participant's personal goals.       Expected Outcomes Achievement of increased cardiorespiratory fitness  and enhanced flexibility, muscular endurance and strength shown through measurements of functional capacity and personal statement of participant.          Exercise Goals Re-Evaluation :    Discharge Exercise Prescription (Final Exercise Prescription Changes):   Nutrition:  Target Goals: Understanding of nutrition guidelines, daily intake of sodium 1500mg , cholesterol 200mg , calories 30% from fat and 7% or less from saturated fats, daily to have 5 or more servings of fruits and vegetables.  Biometrics:  Pre Biometrics - 03/11/17 1015      Pre Biometrics   Waist Circumference 37.5 inches   Hip Circumference 42.5 inches   Waist to Hip Ratio 0.88 %   Triceps Skinfold 22 mm   % Body Fat 29 %   Grip Strength 30 kg   Flexibility 15 in   Single Leg Stand 24 seconds       Nutrition Therapy Plan and Nutrition Goals:     Nutrition Therapy & Goals - 03/11/17 1218      Nutrition Therapy   Diet Therapeutic Lifestyle Changes     Personal Nutrition Goals   Nutrition Goal Wt loss of 1-2 lb/week to a wt loss goal of 6-24 lb at graduation. Pt wt loss goal wt is 175 lb.      Intervention Plan   Intervention Prescribe, educate and counsel regarding individualized specific dietary modifications aiming towards targeted core components such as weight, hypertension, lipid management, diabetes, heart failure and other comorbidities.   Expected Outcomes Short Term Goal: Understand basic principles of dietary content, such as calories, fat, sodium, cholesterol and nutrients.;Long Term Goal: Adherence to prescribed nutrition plan.      Nutrition Discharge: Nutrition Scores:     Nutrition Assessments - 03/11/17 1218      MEDFICTS Scores   Pre Score 28      Nutrition Goals Re-Evaluation:   Nutrition Goals Re-Evaluation:   Nutrition Goals Discharge (Final Nutrition Goals Re-Evaluation):   Psychosocial: Target Goals: Acknowledge presence or absence of significant depression  and/or stress, maximize coping skills, provide positive support system. Participant is able to verbalize types and ability to use techniques and skills needed for reducing stress and depression.  Initial Review & Psychosocial Screening:     Initial Psych Review & Screening - 04/07/17 1614      Initial Review   Current issues with Current Stress Concerns      Quality of Life Scores:     Quality of Life - 03/11/17 1059      Quality of Life Scores   Health/Function Pre 27.6 %   Socioeconomic Pre 23.25 %   Psych/Spiritual Pre 30 %   Family Pre 26.1 %   GLOBAL Pre 27.09 %      PHQ-9: Recent Review Flowsheet Data    Depression screen [facility] 2/9 04/07/2017   Decreased Interest 0   Down, Depressed, Hopeless 0   PHQ - 2 Score 0     Interpretation of Total Score  Total Score Depression Severity:  1-4 = Minimal depression, 5-9 = Mild depression, 10-14 = Moderate depression, 15-19 = Moderately severe depression, 20-27 = Severe depression   Psychosocial Evaluation and Intervention:     Psychosocial Evaluation - 04/07/17 1614      Psychosocial Evaluation & Interventions   Interventions Stress management education;Encouraged to exercise with the program and follow exercise prescription;Relaxation education   Continue Psychosocial Services  Follow up required by staff      Psychosocial Re-Evaluation:   Psychosocial Discharge (Final Psychosocial Re-Evaluation):   Vocational Rehabilitation: Provide vocational rehab assistance to qualifying candidates.   Vocational Rehab Evaluation & Intervention:     Vocational Rehab - 03/11/17 1311      Initial Vocational Rehab Evaluation & Intervention   Assessment shows need for Vocational Rehabilitation Yes      Education: Education Goals: Education classes will be provided on a weekly basis, covering required topics. Participant will state understanding/return demonstration of topics presented.  Learning Barriers/Preferences:  Learning Barriers/Preferences - 03/11/17 1058      Learning Barriers/Preferences   Learning Barriers Sight  need glasses   Learning Preferences Written Material;Video;Verbal Instruction;Skilled Demonstration;Pictoral      Education Topics: Count Your Pulse:  -Group instruction provided by verbal instruction, demonstration, patient participation and written materials to support subject.  Instructors address importance of being able to find your pulse and how to count your pulse when at home without a heart monitor.  Patients get hands on experience counting their pulse with staff help and individually.   Heart Attack, Angina, and Risk Factor Modification:  -Group instruction provided by verbal instruction, video, and written materials to support subject.  Instructors address signs and symptoms of angina and heart attacks.    Also discuss risk factors for heart disease and how to make changes to improve heart health risk factors.   Functional Fitness:  -Group instruction provided by verbal instruction, demonstration, patient participation, and written materials to support subject.  Instructors address safety measures for doing things around the house.  Discuss how to get up and down off the floor, how to pick things up properly, how to safely get out of a chair without assistance, and balance training.   Meditation and Mindfulness:  -Group instruction provided by verbal instruction, patient participation, and written materials to support subject.  Instructor addresses importance of mindfulness and meditation practice to help reduce stress and improve awareness.  Instructor also leads participants through a meditation exercise.    Stretching for Flexibility and Mobility:  -Group instruction provided by verbal instruction, patient participation, and written materials to support subject.  Instructors lead participants through series of stretches that are designed to increase flexibility  thus improving mobility.  These stretches are additional exercise for major muscle groups that are typically performed during regular warm up and cool down.   Hands Only CPR:  -Group verbal, video, and participation provides a basic overview of AHA guidelines for community CPR. Role-play of emergencies allow participants the opportunity to practice calling for help and chest compression technique with discussion of AED use.   Hypertension: -Group verbal and written instruction that provides a basic overview of hypertension including the most recent diagnostic guidelines, risk factor reduction with self-care instructions and medication management.    Nutrition I class: Heart Healthy Eating:  -Group instruction provided by PowerPoint slides, verbal discussion, and written materials to support subject matter. The instructor gives an explanation and review of the Therapeutic Lifestyle Changes diet recommendations, which includes a discussion on lipid goals, dietary fat, sodium, fiber, plant stanol/sterol esters, sugar, and the components of a well-balanced, healthy diet.   Nutrition II class: Lifestyle Skills:  -Group instruction provided by PowerPoint slides, verbal discussion, and written materials to support subject matter. The instructor gives an explanation and review of label reading, grocery shopping for heart health, heart healthy recipe modifications, and ways to make healthier choices when eating out.   Diabetes Question & Answer:  -Group instruction provided by PowerPoint slides, verbal discussion, and written materials to support subject matter. The instructor gives an explanation and review of diabetes co-morbidities, pre- and post-prandial blood glucose goals, pre-exercise blood glucose goals, signs, symptoms, and treatment of hypoglycemia and hyperglycemia, and foot care basics.   Diabetes Blitz:  -Group instruction provided by PowerPoint slides, verbal discussion, and written  materials to support subject matter. The instructor gives an explanation and review of the physiology behind type 1 and type 2 diabetes, diabetes medications and rational behind using different medications,  pre- and post-prandial blood glucose recommendations and Hemoglobin A1c goals, diabetes diet, and exercise including blood glucose guidelines for exercising safely.    Portion Distortion:  -Group instruction provided by PowerPoint slides, verbal discussion, written materials, and food models to support subject matter. The instructor gives an explanation of serving size versus portion size, changes in portions sizes over the last 20 years, and what consists of a serving from each food group.   Stress Management:  -Group instruction provided by verbal instruction, video, and written materials to support subject matter.  Instructors review role of stress in heart disease and how to cope with stress positively.     Exercising on Your Own:  -Group instruction provided by verbal instruction, power point, and written materials to support subject.  Instructors discuss benefits of exercise, components of exercise, frequency and intensity of exercise, and end points for exercise.  Also discuss use of nitroglycerin and activating EMS.  Review options of places to exercise outside of rehab.  Review guidelines for sex with heart disease.   Cardiac Drugs I:  -Group instruction provided by verbal instruction and written materials to support subject.  Instructor reviews cardiac drug classes: antiplatelets, anticoagulants, beta blockers, and statins.  Instructor discusses reasons, side effects, and lifestyle considerations for each drug class.   Cardiac Drugs II:  -Group instruction provided by verbal instruction and written materials to support subject.  Instructor reviews cardiac drug classes: angiotensin converting enzyme inhibitors (ACE-I), angiotensin II receptor blockers (ARBs), nitrates, and calcium  channel blockers.  Instructor discusses reasons, side effects, and lifestyle considerations for each drug class.   Anatomy and Physiology of the Circulatory System:  Group verbal and written instruction and models provide basic cardiac anatomy and physiology, with the coronary electrical and arterial systems. Review of: AMI, Angina, Valve disease, Heart Failure, Peripheral Artery Disease, Cardiac Arrhythmia, Pacemakers, and the ICD.   Other Education:  -Group or individual verbal, written, or video instructions that support the educational goals of the cardiac rehab program.   Knowledge Questionnaire Score:     Knowledge Questionnaire Score - 03/11/17 1057      Knowledge Questionnaire Score   Pre Score 21/28      Core Components/Risk Factors/Patient Goals at Admission:     Personal Goals and Risk Factors at Admission - 03/11/17 1015      Core Components/Risk Factors/Patient Goals on Admission    Weight Management Yes;Obesity;Weight Loss   Intervention Weight Management: Develop a combined nutrition and exercise program designed to reach desired caloric intake, while maintaining appropriate intake of nutrient and fiber, sodium and fats, and appropriate energy expenditure required for the weight goal.;Weight Management: Provide education and appropriate resources to help participant work on and attain dietary goals.;Weight Management/Obesity: Establish reasonable short term and long term weight goals.;Obesity: Provide education and appropriate resources to help participant work on and attain dietary goals.   Admit Weight 190 lb 0.6 oz (86.2 kg)   Goal Weight: Short Term 184 lb (83.5 kg)   Goal Weight: Long Term 175 lb (79.4 kg)   Expected Outcomes Short Term: Continue to assess and modify interventions until short term weight is achieved;Long Term: Adherence to nutrition and physical activity/exercise program aimed toward attainment of established weight goal;Weight Loss: Understanding  of general recommendations for a balanced deficit meal plan, which promotes 1-2 lb weight loss per week and includes a negative energy balance of [phone] kcal/d;Understanding recommendations for meals to include 15-35% energy as protein, 25-35% energy from fat, 35-60% energy from  carbohydrates, less than 200mg  of dietary cholesterol, 20-35 gm of total fiber daily;Understanding of distribution of calorie intake throughout the day with the consumption of 4-5 meals/snacks   Lipids Yes   Intervention Provide education and support for participant on nutrition & aerobic/resistive exercise along with prescribed medications to achieve LDL 70mg , HDL >40mg .   Expected Outcomes Short Term: Participant states understanding of desired cholesterol values and is compliant with medications prescribed. Participant is following exercise prescription and nutrition guidelines.;Long Term: Cholesterol controlled with medications as prescribed, with individualized exercise RX and with personalized nutrition plan. Value goals: LDL < 70mg , HDL > 40 mg.      Core Components/Risk Factors/Patient Goals Review:      Goals and Risk Factor Review    Row Name 04/07/17 1613             Core Components/Risk Factors/Patient Goals Review   Personal Goals Review Weight Management/Obesity;Lipids          Core Components/Risk Factors/Patient Goals at Discharge (Final Review):      Goals and Risk Factor Review - 04/07/17 1613      Core Components/Risk Factors/Patient Goals Review   Personal Goals Review Weight Management/Obesity;Lipids      ITP Comments:     ITP Comments    Row Name 03/11/17 1008           ITP Comments Medical Director, Dr. Armanda Magic          Comments:  Renae Fickle completed 1 session - orientation. Pt did not return back for full exercise.  Note sent to pt cardiologist - Dr. Katrinka Blazing regarding pt attendance to cardiac rehab.  Dr. Katrinka Blazing spoke to pt and pt now will participate in cardiac rehab. Pt  will benefit from continued exercise and life style modification education including  stress management and relaxation techniques to decrease cardiac risk profile. Alanson Aly, BSN Cardiac and Emergency planning/management officer

## 2017-04-07 NOTE — Progress Notes (Signed)
Incomplete Session Note  Patient Details  Name: Omar Sandoval MRN: [id number] Date of Birth: 12/21/1955 Referring Provider:     [facility]ARDI[facility]  Referring Provider  Cleatrice BurkeSmith, Omar      Omar Sandoval did not complete his rehab session.  Medication list reviewed with Mr Omar Sandoval. Mr Omar Sandoval came late and did not exercise today. Mr Omar Sandoval says he has several medications that need to be refilled. Mr Omar Sandoval says he will get his medication refilled today. Mr Omar Sandoval plans to return to exercise on Friday. Mr Omar Sandoval has an appointment on Wednesday and will not be here.Gladstone LighterMaria Redonna Wilbert, RN,BSN 04/07/2017 5:02 PM

## 2017-04-07 NOTE — Telephone Encounter (Signed)
[place] [employer]. Paper dropped off placed in Dr.Smith doc box.

## 2017-04-09 ENCOUNTER — Encounter (HOSPITAL_COMMUNITY): Payer: Medicare HMO

## 2017-04-11 ENCOUNTER — Encounter (HOSPITAL_COMMUNITY)
Admission: RE | Admit: 2017-04-11 | Discharge: 2017-04-11 | Disposition: A | Discharge status: Home / Self Care | Payer: Medicare HMO | Source: Ambulatory Visit | Attending: Interventional Cardiology | Admitting: Interventional Cardiology

## 2017-04-11 DIAGNOSIS — I2111 ST elevation (STEMI) myocardial infarction involving right coronary artery: Secondary | ICD-10-CM | POA: Diagnosis not present

## 2017-04-11 DIAGNOSIS — Z955 Presence of coronary angioplasty implant and graft: Secondary | ICD-10-CM | POA: Diagnosis present

## 2017-04-11 DIAGNOSIS — I213 ST elevation (STEMI) myocardial infarction of unspecified site: Secondary | ICD-10-CM | POA: Diagnosis present

## 2017-04-11 NOTE — Progress Notes (Signed)
Daily Session Note  Patient Details  Name: Omar Sandoval MRN: [id number] Date of Birth: [birth year] Referring Provider:     CARDIAC REHAB PHASE II ORIENTATION from 03/11/2017 in [place]  Referring Provider  Daneen Schick      Encounter Date: 04/11/2017  Check In:     Session Check In - 04/11/17 1119      Check-In   Location [facility]-Cardiac [facility] Pul[facility]   Staff Present Luetta Nutting Fair, MS, ACSM RCEP, Exercise Physiologist;Molly diVincenzo, MS, ACSM RCEP, Exercise Physiologist;Carlette Wilber Oliphant, RN, BSN;Joann Rion, RN, [employer] physician immediately available to respond to emergencies [facility] immediately available   Physician(s) Dr. Wendee Beavers   Medication changes reported     No   Fall or balance concerns reported    No   Tobacco Cessation No Change   Warm-up and Cool-down Performed as group-led instruction   Resistance Training Performed Yes   VAD Patient? No     Pain Assessment   Currently in Pain? No/denies   Multiple Pain Sites No      Capillary Blood Glucose: No results found for this or any previous visit (from the past 24 hour(s)).    History  Smoking Status  . Former Smoker  . Packs/day: 0.00  . Types: Cigarettes  Smokeless Tobacco  . Never Used    Goals Met:  No report of cardiac concerns or symptoms  Goals Unmet:  Not Applicable  Comments:  Pt returned to cardiac rehab today.  Pt arrived on time and he is accompanied by a 60 year old male child. Pt tolerated light exercise without difficulty. VSS, telemetry-SR, asymptomatic.  Medication list reconciled. Pt denies barriers to medication compliance. Pt picked up his refills earlier this week.  PSYCHOSOCIAL ASSESSMENT:  PHQ-0.  Pt lives alone by choice. Pt has a significant other who comes to visit at times.  Pt completed pre assessment quality of life survey.  Pt scored the following     Quality of Life - 03/11/17 1059      Quality of Life Scores   Health/Function  Pre 27.6 %   Socioeconomic Pre 23.25 %   Psych/Spiritual Pre 30 %   Family Pre 26.1 %   GLOBAL Pre 27.09 %    Pt exhibits positive coping skills, hopeful outlook with supportive family. No psychosocial needs identified at this time, no psychosocial interventions necessary. Pt enjoys taking care of kids.  Pt oriented to exercise equipment and routine. Pt would like to lose 25 pounds.  Pt has been successful in the past with weight loss however pt would only eat one meal a day - breakfast.  Encouraged pt to attend group nutrition classes offered on Tuesdays by the dietician to learn strategies and techniques that are heart healthy.  Understanding verbalized. Maurice Small RN, BSN Cardiac and Pulmonary Rehab Nurse Navigator      Dr. Fransico Him is Medical Director for Cardiac Rehab at [facility].

## 2017-04-14 ENCOUNTER — Encounter (HOSPITAL_COMMUNITY)
Admission: RE | Admit: 2017-04-14 | Discharge: 2017-04-14 | Disposition: A | Discharge status: Home / Self Care | Payer: Medicare HMO | Source: Ambulatory Visit | Attending: Interventional Cardiology | Admitting: Interventional Cardiology

## 2017-04-14 DIAGNOSIS — I2111 ST elevation (STEMI) myocardial infarction involving right coronary artery: Secondary | ICD-10-CM | POA: Diagnosis not present

## 2017-04-14 DIAGNOSIS — Z955 Presence of coronary angioplasty implant and graft: Secondary | ICD-10-CM

## 2017-04-16 ENCOUNTER — Encounter (HOSPITAL_COMMUNITY)
Admission: RE | Admit: 2017-04-16 | Discharge: 2017-04-16 | Disposition: A | Discharge status: Home / Self Care | Payer: Medicare HMO | Source: Ambulatory Visit | Attending: Interventional Cardiology | Admitting: Interventional Cardiology

## 2017-04-16 DIAGNOSIS — I2111 ST elevation (STEMI) myocardial infarction involving right coronary artery: Secondary | ICD-10-CM | POA: Diagnosis not present

## 2017-04-16 DIAGNOSIS — Z955 Presence of coronary angioplasty implant and graft: Secondary | ICD-10-CM

## 2017-04-18 ENCOUNTER — Encounter (HOSPITAL_COMMUNITY)
Admission: RE | Admit: 2017-04-18 | Discharge: 2017-04-18 | Disposition: A | Discharge status: Home / Self Care | Payer: Medicare HMO | Source: Ambulatory Visit | Attending: Interventional Cardiology | Admitting: Interventional Cardiology

## 2017-04-18 ENCOUNTER — Encounter (HOSPITAL_COMMUNITY): Payer: Medicare HMO

## 2017-04-18 DIAGNOSIS — Z955 Presence of coronary angioplasty implant and graft: Secondary | ICD-10-CM

## 2017-04-18 DIAGNOSIS — I2111 ST elevation (STEMI) myocardial infarction involving right coronary artery: Secondary | ICD-10-CM

## 2017-04-21 ENCOUNTER — Encounter (HOSPITAL_COMMUNITY): Payer: Medicare HMO

## 2017-04-21 ENCOUNTER — Encounter (HOSPITAL_COMMUNITY)
Admission: RE | Admit: 2017-04-21 | Discharge: 2017-04-21 | Disposition: A | Discharge status: Home / Self Care | Payer: Medicare HMO | Source: Ambulatory Visit | Attending: Interventional Cardiology | Admitting: Interventional Cardiology

## 2017-04-21 DIAGNOSIS — I2111 ST elevation (STEMI) myocardial infarction involving right coronary artery: Secondary | ICD-10-CM | POA: Diagnosis not present

## 2017-04-21 DIAGNOSIS — Z955 Presence of coronary angioplasty implant and graft: Secondary | ICD-10-CM

## 2017-04-23 ENCOUNTER — Encounter (HOSPITAL_COMMUNITY)
Admission: RE | Admit: 2017-04-23 | Discharge: 2017-04-23 | Disposition: A | Discharge status: Home / Self Care | Payer: Medicare HMO | Source: Ambulatory Visit | Attending: Interventional Cardiology | Admitting: Interventional Cardiology

## 2017-04-23 ENCOUNTER — Encounter (HOSPITAL_COMMUNITY): Payer: Medicare HMO

## 2017-04-23 DIAGNOSIS — I2111 ST elevation (STEMI) myocardial infarction involving right coronary artery: Secondary | ICD-10-CM | POA: Diagnosis not present

## 2017-04-23 DIAGNOSIS — Z955 Presence of coronary angioplasty implant and graft: Secondary | ICD-10-CM

## 2017-04-25 ENCOUNTER — Encounter (HOSPITAL_COMMUNITY)
Admission: RE | Admit: 2017-04-25 | Discharge: 2017-04-25 | Disposition: A | Discharge status: Home / Self Care | Payer: Medicare HMO | Source: Ambulatory Visit | Attending: Interventional Cardiology | Admitting: Interventional Cardiology

## 2017-04-25 ENCOUNTER — Other Ambulatory Visit: Payer: Self-pay | Admitting: *Deleted

## 2017-04-25 ENCOUNTER — Encounter (HOSPITAL_COMMUNITY): Payer: Medicare HMO

## 2017-04-25 DIAGNOSIS — I2111 ST elevation (STEMI) myocardial infarction involving right coronary artery: Secondary | ICD-10-CM | POA: Diagnosis not present

## 2017-04-25 DIAGNOSIS — Z955 Presence of coronary angioplasty implant and graft: Secondary | ICD-10-CM

## 2017-04-25 NOTE — Telephone Encounter (Signed)
Ok to fill.  Liver and lipid were done 01/2017 so not necessary to repeat at this time.  Thanks!

## 2017-04-25 NOTE — Telephone Encounter (Signed)
Received request from the pharmacy to refill patients atorvastatin for ninety days. Medication was prescribed by Laverda PageLindsay Roberts and when patient was seen by Dr Katrinka BlazingSmith earlier this month he stated in the notes that a lipid panel would be done. Patient did not have labs drawn. Okay to go ahead and refill? Please advise. Thanks, MI

## 2017-04-29 MED ORDER — ATORVASTATIN CALCIUM 40 MG PO TABS: 40.0000 mg | ORAL_TABLET | Freq: Every day | ORAL | 2 refills | Status: DC

## 2017-04-30 ENCOUNTER — Encounter (HOSPITAL_COMMUNITY)
Admission: RE | Admit: 2017-04-30 | Discharge: 2017-04-30 | Disposition: A | Discharge status: Home / Self Care | Payer: Medicare HMO | Source: Ambulatory Visit | Attending: Interventional Cardiology | Admitting: Interventional Cardiology

## 2017-04-30 ENCOUNTER — Encounter (HOSPITAL_COMMUNITY): Payer: Medicare HMO

## 2017-04-30 DIAGNOSIS — Z955 Presence of coronary angioplasty implant and graft: Secondary | ICD-10-CM | POA: Diagnosis present

## 2017-04-30 DIAGNOSIS — I2111 ST elevation (STEMI) myocardial infarction involving right coronary artery: Secondary | ICD-10-CM | POA: Insufficient documentation

## 2017-04-30 DIAGNOSIS — I213 ST elevation (STEMI) myocardial infarction of unspecified site: Secondary | ICD-10-CM | POA: Diagnosis present

## 2017-04-30 NOTE — Progress Notes (Signed)
Reviewed home exercise with pt today.  [facility], 2x/week in addition to coming to cardiac rehab.  Reviewed THR, pulse, RPE, sign and symptoms, NTG use, and when to call 911 or MD.  Also discussed weather considerations and indoor options.  Pt voiced understanding.     Aws [employer]re [employer],MS,ACSM RCEP

## 2017-04-30 NOTE — Progress Notes (Signed)
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  Advised pt that we could not allow a 61 year old to be unsupervised. Pt understood and will make alternate plans on days of exercise. Alanson Alyarlette Carlton RN, BSN Cardiac and Emergency planning/management officerulmonary Rehab Nurse Navigator

## 2017-05-02 ENCOUNTER — Encounter (HOSPITAL_COMMUNITY)
Admission: RE | Admit: 2017-05-02 | Discharge: 2017-05-02 | Disposition: A | Discharge status: Home / Self Care | Payer: Medicare HMO | Source: Ambulatory Visit | Attending: Interventional Cardiology | Admitting: Interventional Cardiology

## 2017-05-02 ENCOUNTER — Encounter (HOSPITAL_COMMUNITY): Payer: Medicare HMO

## 2017-05-02 DIAGNOSIS — I2111 ST elevation (STEMI) myocardial infarction involving right coronary artery: Secondary | ICD-10-CM

## 2017-05-02 DIAGNOSIS — Z955 Presence of coronary angioplasty implant and graft: Secondary | ICD-10-CM

## 2017-05-05 ENCOUNTER — Encounter (HOSPITAL_COMMUNITY)
Admission: RE | Admit: 2017-05-05 | Discharge: 2017-05-05 | Disposition: A | Discharge status: Home / Self Care | Payer: Medicare HMO | Source: Ambulatory Visit | Attending: Interventional Cardiology | Admitting: Interventional Cardiology

## 2017-05-05 ENCOUNTER — Encounter (HOSPITAL_COMMUNITY): Payer: Medicare HMO

## 2017-05-05 DIAGNOSIS — I2111 ST elevation (STEMI) myocardial infarction involving right coronary artery: Secondary | ICD-10-CM

## 2017-05-05 DIAGNOSIS — Z955 Presence of coronary angioplasty implant and graft: Secondary | ICD-10-CM

## 2017-05-05 NOTE — Progress Notes (Signed)
Omar Sandoval 61 y.o. male       Nutrition Note  1. 02/03/17 ST elevation myocardial infarction involving right coronary artery ([place])   2. 02/03/17 Stented coronary artery    Note Spoke with pt. Nutrition plan and survey reviewed with pt. Pt is following Step 2 of the Therapeutic Lifestyle Changes diet according to MEDFICTS results. Pt continues eating fried chicken drummets despite "knowing I shouldn't." Pt wants to lose wt. Pt wt is 183.9 lb (83.6 kg), which is stable since admission. Wt loss tips briefly reviewed. Pt expressed understanding of the information reviewed. Pt aware of nutrition education classes offered and plans on attending nutrition classes.  Nutrition Diagnosis ? Food-and nutrition-related knowledge deficit related to lack of exposure to information as related to diagnosis of: ? CVD ? 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 Pt's individual nutrition plan and goals reviewed with pt.  Nutrition Goal(s):  ? Pt to identify food quantities necessary to achieve weight loss of 6-25 lb at graduation from cardiac rehab. Goal wt of 175 lb desired.   Plan:  Pt to attend nutrition classes ? Nutrition I ? Nutrition II ? Portion Distortion - met 04/16/17 Will provide client-centered nutrition education as part of interdisciplinary care.   Monitor and evaluate progress toward nutrition goal with team.  Derek Mound, M.Ed, RD, LDN, CDE 05/05/2017 12:20 PM

## 2017-05-06 NOTE — Progress Notes (Signed)
Cardiac Individual Treatment Plan  Patient Details  Name: Omar Sandoval MRN: [id number] Date of Birth: [birth year] Referring Provider:     CARDIAC REHAB PHASE II ORIENTATION from 03/11/2017 in [facility] CARDIAC [facility]  Referring Provider  Verdis Prime      Initial Encounter Date:    CARDIAC REHAB PHASE II ORIENTATION from 03/11/2017 in [facility] CARDIAC REHAB  Date  03/11/17  Referring Provider  Verdis Prime      Visit Diagnosis: 02/03/17 ST elevation myocardial infarction involving right coronary artery (HCC)  02/03/17 Stented coronary artery  Patient's Home Medications on Admission:  Current Outpatient Prescriptions:  .  Abacavir-Dolutegravir-Lamivud (TRIUMEQ) 600-50-300 MG TABS, Take 1 tablet by mouth daily. , Disp: , Rfl:  .  aspirin 81 MG chewable tablet, Chew 1 tablet (81 mg total) by mouth daily., Disp: , Rfl:  .  atorvastatin (LIPITOR) 40 MG tablet, Take 1 tablet (40 mg total) by mouth daily at 6 PM., Disp: 90 tablet, Rfl: 2 .  metoprolol succinate (TOPROL-XL) 25 MG 24 hr tablet, Take 0.5 tablets (12.5 mg total) by mouth 2 (two) times daily., Disp: 90 tablet, Rfl: 2 .  nitroGLYCERIN (NITROSTAT) 0.4 MG SL tablet, Place 1 tablet (0.4 mg total) under the tongue every 5 (five) minutes as needed., Disp: 25 tablet, Rfl: 3 .  oxyCODONE-acetaminophen (PERCOCET) 10-325 MG per tablet, Take 0.5-1 tablets by mouth every 4 (four) hours as needed for pain. (Patient taking differently: Take 1 tablet by mouth every 4 (four) hours as needed for pain. ), Disp: 10 tablet, Rfl: 0 .  ticagrelor (BRILINTA) 90 MG TABS tablet, Take 1 tablet (90 mg total) by mouth 2 (two) times daily., Disp: 60 tablet, Rfl: 10  Past Medical History: Past Medical History:  Diagnosis Date  . HIV (human immunodeficiency virus infection) (HCC)   . Immune deficiency disorder (HCC)     Tobacco Use: History  Smoking Status  . Former Smoker  . Packs/day: 0.00  . Types: Cigarettes   Smokeless Tobacco  . Never Used    Labs: Recent Review Flowsheet Data    Labs for ITP Cardiac and Pulmonary Rehab Latest Ref Rng & Units 09/08/2008 03/28/2009 08/12/2014 02/03/2017 02/04/2017   Cholestrol 0 - 200 mg/dL - - - 308 -   LDLCALC 0 - 99 mg/dL - - - 657(Q) -   HDL >46 mg/dL - - - 47 -   Trlycerides <150 mg/dL - - - 39 -   Hemoglobin A1c 4.8 - 5.6 % - - - - 5.5   TCO2 0 - 100 mmol/L -      Capillary Blood Glucose: No results found for: GLUCAP   Exercise Target Goals:    Exercise Program Goal: Individual exercise prescription set with THRR, safety & activity barriers. Participant demonstrates ability to understand and report RPE using BORG scale, to self-measure pulse accurately, and to acknowledge the importance of the exercise prescription.  Exercise Prescription Goal: Starting with aerobic activity 30 plus minutes a day, 3 days per week for initial exercise prescription. Provide home exercise prescription and guidelines that participant acknowledges understanding prior to discharge.  Activity Barriers & Risk Stratification:     Activity Barriers & Cardiac Risk Stratification - 03/11/17 1014      Activity Barriers & Cardiac Risk Stratification   Activity Barriers Arthritis;Joint Problems;Deconditioning   Cardiac Risk Stratification High      6 Minute Walk:     6 Minute Walk  Row Name 03/11/17 1013         6 Minute Walk   Phase Initial     Distance 1514 feet     Walk Time 6 minutes     # of Rest Breaks 0     MPH 2.87     METS 3.57     RPE 9     VO2 Peak 12.49     Symptoms No     Resting HR 75 bpm     Resting BP 102/60     Max Ex. HR 105 bpm     Max Ex. BP 118/72     2 Minute Post BP 106/60        Oxygen Initial Assessment:   Oxygen Re-Evaluation:   Oxygen Discharge (Final Oxygen Re-Evaluation):   Initial Exercise Prescription:     Initial Exercise Prescription - 03/11/17 1000      Date of Initial Exercise RX and  Referring Provider   Date 03/11/17   Referring Provider Verdis Prime     Treadmill   MPH 2.8   Grade 0   Minutes 10   METs 3.14     Bike   Level 0.8   Minutes 10   METs 2.78     NuStep   Level 3   SPM 80   Minutes 10   METs 2.4     Prescription Details   Frequency (times per week) 3   Duration Progress to 30 minutes of continuous aerobic without signs/symptoms of physical distress     Intensity   THRR 40-80% of Max Heartrate 64-128   Ratings of Perceived Exertion 11-15   Perceived Dyspnea 0-4     Progression   Progression Continue to progress workloads to maintain intensity without signs/symptoms of physical distress.     Resistance Training   Training Prescription Yes   Weight 3lbs   Reps 10-15      Perform Capillary Blood Glucose checks as needed.  Exercise Prescription Changes:      Exercise Prescription Changes    Row Name 04/11/17 1349 04/25/17 1339 05/01/17 1300         Response to Exercise   Blood Pressure (Admit) (P)  92/60 102/62 100/62     Blood Pressure (Exercise) (P)  102/71 118/68 120/80     Blood Pressure (Exit) (P)  98/60 114/70 100/60     Heart Rate (Admit) (P)  70 bpm 76 bpm 84 bpm     Heart Rate (Exercise) (P)  94 bpm 104 bpm 101 bpm     Heart Rate (Exit) (P)  63 bpm 61 bpm 73 bpm     Rating of Perceived Exertion (Exercise) (P)  12 13 12      Symptoms (P)  none none none     Duration (P)  Continue with 30 min of aerobic exercise without signs/symptoms of physical distress. Continue with 30 min of aerobic exercise without signs/symptoms of physical distress. Continue with 30 min of aerobic exercise without signs/symptoms of physical distress.     Intensity (P)  THRR unchanged THRR unchanged THRR unchanged       Progression   Progression (P)  Continue to progress workloads to maintain intensity without signs/symptoms of physical distress. Continue to progress workloads to maintain intensity without signs/symptoms of physical distress.  Continue to progress workloads to maintain intensity without signs/symptoms of physical distress.     Average METs  - 3.1 3.3       Resistance Training  Training Prescription (P)  Yes Yes Yes     Weight (P)  3lbs 3lbs 3lbs     Reps (P)  10-15 10-15 10-15     Time (P)  10 Minutes 10 Minutes 10 Minutes       Treadmill   MPH (P)  2.8 2.8 3     Grade (P)  0 0 0     Minutes (P)  10 10 10      METs (P)  3.14 3.14 3.3       Bike   Level (P)  0.8  -  -     Minutes (P)  10  -  -     METs (P)  2.75  -  -       Recumbant Bike   Level  - 3 3.5     Minutes  - 10  -     METs  - 2.9 3       NuStep   Level (P)  3 4 4      SPM (P)  80 90 90     Minutes (P)  10 10 10      METs  - 3.3 3.2       Home Exercise Plan   Plans to continue exercise at  -  - Home (comment)  walking and may go to [employer]     Frequency  -  - Add 2 additional days to program exercise sessions.     Initial Home Exercises Provided  -  - 04/30/17        Exercise Comments:      Exercise Comments    Row Name 05/01/17 1600           Exercise Comments Reviewed METs goals and HEP with pt. Pt will be independent with home exercise program and continue show progression in cardiac rehab.          Exercise Goals and Review:      Exercise Goals    Row Name 03/11/17 1015             Exercise Goals   Increase Physical Activity Yes       Intervention Provide advice, education, support and counseling about physical activity/exercise needs.;Develop an individualized exercise prescription for aerobic and resistive training based on initial evaluation findings, risk stratification, comorbidities and participant's personal goals.       Expected Outcomes Achievement of increased cardiorespiratory fitness and enhanced flexibility, muscular endurance and strength shown through measurements of functional capacity and personal statement of participant.       Increase Strength and Stamina Yes       Intervention  Provide advice, education, support and counseling about physical activity/exercise needs.;Develop an individualized exercise prescription for aerobic and resistive training based on initial evaluation findings, risk stratification, comorbidities and participant's personal goals.       Expected Outcomes Achievement of increased cardiorespiratory fitness and enhanced flexibility, muscular endurance and strength shown through measurements of functional capacity and personal statement of participant.          Exercise Goals Re-Evaluation :     Exercise Goals Re-Evaluation    Row Name 04/30/17 1552 05/01/17 1600           Exercise Goal Re-Evaluation   Exercise Goals Review Able to understand and use rate of perceived exertion (RPE) scale;Knowledge and understanding of Target Heart Rate Range (THRR);Increase Physical Activity;Understanding of Exercise Prescription;Increase Strength and Stamina Increase Physical Activity;Able to understand and use  rate of perceived exertion (RPE) scale;Knowledge and understanding of Target Heart Rate Range (THRR);Understanding of Exercise Prescription;Increase Strength and Stamina      Comments Reviewed home exercise with pt today.  Pt plans to walk or go to [facility] for exercise, 2x/week in addition to coming to cardiac rehab.  Reviewed THR, pulse, RPE, sign and symptoms, NTG use, and when to call 911 or MD.  Also discussed weather considerations and indoor options.  Pt voiced understanding.  -      Expected Outcomes Pt will be compliant with HEP and improve in cardiorespiratory fitness and functional capacity.  -          Discharge Exercise Prescription (Final Exercise Prescription Changes):     Exercise Prescription Changes - 05/01/17 1300      Response to Exercise   Blood Pressure (Admit) 100/62   Blood Pressure (Exercise) 120/80   Blood Pressure (Exit) 100/60   Heart Rate (Admit) 84 bpm   Heart Rate (Exercise) 101 bpm   Heart Rate (Exit) 73  bpm   Rating of Perceived Exertion (Exercise) 12   Symptoms none   Duration Continue with 30 min of aerobic exercise without signs/symptoms of physical distress.   Intensity THRR unchanged     Progression   Progression Continue to progress workloads to maintain intensity without signs/symptoms of physical distress.   Average METs 3.3     Resistance Training   Training Prescription Yes   Weight 3lbs   Reps 10-15   Time 10 Minutes     Treadmill   MPH 3   Grade 0   Minutes 10   METs 3.3     Recumbant Bike   Level 3.5   METs 3     NuStep   Level 4   SPM 90   Minutes 10   METs 3.2     Home Exercise Plan   Plans to continue exercise at Home (comment)  walking and may go to [employer]   Frequency Add 2 additional days to program exercise sessions.   Initial Home Exercises Provided 04/30/17      Nutrition:  Target Goals: Understanding of nutrition guidelines, daily intake of sodium 1500mg , cholesterol 200mg , calories 30% from fat and 7% or less from saturated fats, daily to have 5 or more servings of fruits and vegetables.  Biometrics:     Pre Biometrics - 03/11/17 1015      Pre Biometrics   Waist Circumference 37.5 inches   Hip Circumference 42.5 inches   Waist to Hip Ratio 0.88 %   Triceps Skinfold 22 mm   % Body Fat 29 %   Grip Strength 30 kg   Flexibility 15 in   Single Leg Stand 24 seconds       Nutrition Therapy Plan and Nutrition Goals:     Nutrition Therapy & Goals - 03/11/17 1218      Nutrition Therapy   Diet Therapeutic Lifestyle Changes     Personal Nutrition Goals   Nutrition Goal Wt loss of 1-2 lb/week to a wt loss goal of 6-24 lb at graduation. Pt wt loss goal wt is 175 lb.      Intervention Plan   Intervention Prescribe, educate and counsel regarding individualized specific dietary modifications aiming towards targeted core components such as weight, hypertension, lipid management, diabetes, heart failure and other  comorbidities.   Expected Outcomes Short Term Goal: Understand basic principles of dietary content, such as calories, fat, sodium, cholesterol and nutrients.;Long  Term Goal: Adherence to prescribed nutrition plan.      Nutrition Discharge: Nutrition Scores:     Nutrition Assessments - 03/11/17 1218      MEDFICTS Scores   Pre Score 28      Nutrition Goals Re-Evaluation:   Nutrition Goals Re-Evaluation:   Nutrition Goals Discharge (Final Nutrition Goals Re-Evaluation):   Psychosocial: Target Goals: Acknowledge presence or absence of significant depression and/or stress, maximize coping skills, provide positive support system. Participant is able to verbalize types and ability to use techniques and skills needed for reducing stress and depression.  Initial Review & Psychosocial Screening:     Initial Psych Review & Screening - 04/07/17 1614      Initial Review   Current issues with Current Stress Concerns      Quality of Life Scores:     Quality of Life - 03/11/17 1059      Quality of Life Scores   Health/Function Pre 27.6 %   Socioeconomic Pre 23.25 %   Psych/Spiritual Pre 30 %   Family Pre 26.1 %   GLOBAL Pre 27.09 %      PHQ-9: Recent Review Flowsheet Data    Depression screen [facility] 2/9 04/07/2017   Decreased Interest 0   Down, Depressed, Hopeless 0   PHQ - 2 Score 0     Interpretation of Total Score  Total Score Depression Severity:  1-4 = Minimal depression, 5-9 = Mild depression, 10-14 = Moderate depression, 15-19 = Moderately severe depression, 20-27 = Severe depression   Psychosocial Evaluation and Intervention:     Psychosocial Evaluation - 05/06/17 2236      Psychosocial Evaluation & Interventions   Interventions Stress management education;Encouraged to exercise with the program and follow exercise prescription;Relaxation education   Comments Pt displays healthy and positive outlook.  pt interacts well with fellow participants and staff.  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   Expected Outcomes Pt will exhibit positive and healthy coping skills with meaningful contribution to his community   Continue Psychosocial Services  No Follow up required      Psychosocial Re-Evaluation:     Psychosocial Re-Evaluation    Row Name 05/06/17 2237             Psychosocial Re-Evaluation   Current issues with None Identified       Interventions Encouraged to attend Cardiac Rehabilitation for the exercise;Relaxation education;Stress management education       Continue Psychosocial Services  No Follow up required          Psychosocial Discharge (Final Psychosocial Re-Evaluation):     Psychosocial Re-Evaluation - 05/06/17 2237      Psychosocial Re-Evaluation   Current issues with None Identified   Interventions Encouraged to attend Cardiac Rehabilitation for the exercise;Relaxation education;Stress management education   Continue Psychosocial Services  No Follow up required      Vocational Rehabilitation: Provide vocational rehab assistance to qualifying candidates.   Vocational Rehab Evaluation & Intervention:     Vocational Rehab - 03/11/17 1311      Initial Vocational Rehab Evaluation & Intervention   Assessment shows need for Vocational Rehabilitation Yes      Education: Education Goals: Education classes will be provided on a weekly basis, covering required topics. Participant will state understanding/return demonstration of topics presented.  Learning Barriers/Preferences:     Learning Barriers/Preferences - 03/11/17 1058      Learning Barriers/Preferences   Learning Barriers Sight  need glasses   Learning  Preferences Written Material;Video;Verbal Instruction;Skilled Demonstration;Pictoral      Education Topics: Count Your Pulse:  -Group instruction provided by verbal instruction, demonstration, patient participation and written materials to support subject.  Instructors address  importance of being able to find your pulse and how to count your pulse when at home without a heart monitor.  Patients get hands on experience counting their pulse with staff help and individually.   CARDIAC REHAB PHASE II EXERCISE from 05/02/2017 in [facility] CARDIAC REHAB  Date  05/02/17  Instruction Review Code  2- meets goals/outcomes      Heart Attack, Angina, and Risk Factor Modification:  -Group instruction provided by verbal instruction, video, and written materials to support subject.  Instructors address signs and symptoms of angina and heart attacks.    Also discuss risk factors for heart disease and how to make changes to improve heart health risk factors.   Functional Fitness:  -Group instruction provided by verbal instruction, demonstration, patient participation, and written materials to support subject.  Instructors address safety measures for doing things around the house.  Discuss how to get up and down off the floor, how to pick things up properly, how to safely get out of a chair without assistance, and balance training.   CARDIAC REHAB PHASE II EXERCISE from 05/02/2017 in [facility] CARDIAC REHAB  Date  04/11/17  Instruction Review Code  2- meets goals/outcomes      Meditation and Mindfulness:  -Group instruction provided by verbal instruction, patient participation, and written materials to support subject.  Instructor addresses importance of mindfulness and meditation practice to help reduce stress and improve awareness.  Instructor also leads participants through a meditation exercise.    Stretching for Flexibility and Mobility:  -Group instruction provided by verbal instruction, patient participation, and written materials to support subject.  Instructors lead participants through series of stretches that are designed to increase flexibility thus improving mobility.  These stretches are additional exercise for major muscle groups that  are typically performed during regular warm up and cool down.   CARDIAC REHAB PHASE II EXERCISE from 05/02/2017 in [facility] CARDIAC REHAB  Date  04/18/17  Educator  EP  Instruction Review Code  2- meets goals/outcomes      Hands Only CPR:  -Group verbal, video, and participation provides a basic overview of AHA guidelines for community CPR. Role-play of emergencies allow participants the opportunity to practice calling for help and chest compression technique with discussion of AED use.   Hypertension: -Group verbal and written instruction that provides a basic overview of hypertension including the most recent diagnostic guidelines, risk factor reduction with self-care instructions and medication management.    Nutrition I class: Heart Healthy Eating:  -Group instruction provided by PowerPoint slides, verbal discussion, and written materials to support subject matter. The instructor gives an explanation and review of the Therapeutic Lifestyle Changes diet recommendations, which includes a discussion on lipid goals, dietary fat, sodium, fiber, plant stanol/sterol esters, sugar, and the components of a well-balanced, healthy diet.   Nutrition II class: Lifestyle Skills:  -Group instruction provided by PowerPoint slides, verbal discussion, and written materials to support subject matter. The instructor gives an explanation and review of label reading, grocery shopping for heart health, heart healthy recipe modifications, and ways to make healthier choices when eating out.   Diabetes Question & Answer:  -Group instruction provided by PowerPoint slides, verbal discussion, and written materials to support subject matter. The instructor gives  an explanation and review of diabetes co-morbidities, pre- and post-prandial blood glucose goals, pre-exercise blood glucose goals, signs, symptoms, and treatment of hypoglycemia and hyperglycemia, and foot care basics.   Diabetes Blitz:   -Group instruction provided by PowerPoint slides, verbal discussion, and written materials to support subject matter. The instructor gives an explanation and review of the physiology behind type 1 and type 2 diabetes, diabetes medications and rational behind using different medications, pre- and post-prandial blood glucose recommendations and Hemoglobin A1c goals, diabetes diet, and exercise including blood glucose guidelines for exercising safely.    Portion Distortion:  -Group instruction provided by PowerPoint slides, verbal discussion, written materials, and food models to support subject matter. The instructor gives an explanation of serving size versus portion size, changes in portions sizes over the last 20 years, and what consists of a serving from each food group.   CARDIAC REHAB PHASE II EXERCISE from 05/02/2017 in [facility] CARDIAC REHAB  Date  04/16/17  Educator  RD  Instruction Review Code  2- meets goals/outcomes      Stress Management:  -Group instruction provided by verbal instruction, video, and written materials to support subject matter.  Instructors review role of stress in heart disease and how to cope with stress positively.     CARDIAC REHAB PHASE II EXERCISE from 05/02/2017 in [facility] CARDIAC REHAB  Date  04/23/17  Instruction Review Code  2- meets goals/outcomes      Exercising on Your Own:  -Group instruction provided by verbal instruction, power point, and written materials to support subject.  Instructors discuss benefits of exercise, components of exercise, frequency and intensity of exercise, and end points for exercise.  Also discuss use of nitroglycerin and activating EMS.  Review options of places to exercise outside of rehab.  Review guidelines for sex with heart disease.   CARDIAC REHAB PHASE II EXERCISE from 05/02/2017 in [facility] CARDIAC REHAB  Date  04/30/17  Instruction Review Code  2- meets  goals/outcomes      Cardiac Drugs I:  -Group instruction provided by verbal instruction and written materials to support subject.  Instructor reviews cardiac drug classes: antiplatelets, anticoagulants, beta blockers, and statins.  Instructor discusses reasons, side effects, and lifestyle considerations for each drug class.   Cardiac Drugs II:  -Group instruction provided by verbal instruction and written materials to support subject.  Instructor reviews cardiac drug classes: angiotensin converting enzyme inhibitors (ACE-I), angiotensin II receptor blockers (ARBs), nitrates, and calcium channel blockers.  Instructor discusses reasons, side effects, and lifestyle considerations for each drug class.   Anatomy and Physiology of the Circulatory System:  Group verbal and written instruction and models provide basic cardiac anatomy and physiology, with the coronary electrical and arterial systems. Review of: AMI, Angina, Valve disease, Heart Failure, Peripheral Artery Disease, Cardiac Arrhythmia, Pacemakers, and the ICD.   Other Education:  -Group or individual verbal, written, or video instructions that support the educational goals of the cardiac rehab program.   Knowledge Questionnaire Score:     Knowledge Questionnaire Score - 03/11/17 1057      Knowledge Questionnaire Score   Pre Score 21/28      Core Components/Risk Factors/Patient Goals at Admission:     Personal Goals and Risk Factors at Admission - 03/11/17 1015      Core Components/Risk Factors/Patient Goals on Admission    Weight Management Yes;Obesity;Weight Loss   Intervention Weight Management: Develop a combined nutrition and exercise program designed  to reach desired caloric intake, while maintaining appropriate intake of nutrient and fiber, sodium and fats, and appropriate energy expenditure required for the weight goal.;Weight Management: Provide education and appropriate resources to help participant work on and  attain dietary goals.;Weight Management/Obesity: Establish reasonable short term and long term weight goals.;Obesity: Provide education and appropriate resources to help participant work on and attain dietary goals.   Admit Weight 190 lb 0.6 oz (86.2 kg)   Goal Weight: Short Term 184 lb (83.5 kg)   Goal Weight: Long Term 175 lb (79.4 kg)   Expected Outcomes Short Term: Continue to assess and modify interventions until short term weight is achieved;Long Term: Adherence to nutrition and physical activity/exercise program aimed toward attainment of established weight goal;Weight Loss: Understanding of general recommendations for a balanced deficit meal plan, which promotes 1-2 lb weight loss per week and includes a negative energy balance of [phone] kcal/d;Understanding recommendations for meals to include 15-35% energy as protein, 25-35% energy from fat, 35-60% energy from carbohydrates, less than  of dietary cholesterol, 20-35 gm of total fiber daily;Understanding of distribution of calorie intake throughout the day with the consumption of 4-5 meals/snacks   Lipids Yes   Intervention Provide education and support for participant on nutrition & aerobic/resistive exercise along with prescribed medications to achieve LDL 70mg , HDL >40mg .   Expected Outcomes Short Term: Participant states understanding of desired cholesterol values and is compliant with medications prescribed. Participant is following exercise prescription and nutrition guidelines.;Long Term: Cholesterol controlled with medications as prescribed, with individualized exercise RX and with personalized nutrition plan. Value goals: LDL < , HDL > 40 mg.      Core Components/Risk Factors/Patient Goals Review:      Goals and Risk Factor Review    Row Name 04/07/17 1613 05/06/17 2234           Core Components/Risk Factors/Patient Goals Review   Personal Goals Review Weight Management/Obesity;Lipids Weight Management/Obesity;Lipids       Review  - Pt is off to a good start with weight loss.  Pt has lost about 5 pounds and has very good attendance.         Core Components/Risk Factors/Patient Goals at Discharge (Final Review):      Goals and Risk Factor Review - 05/06/17 2234      Core Components/Risk Factors/Patient Goals Review   Personal Goals Review Weight Management/Obesity;Lipids   Review Pt is off to a good start with weight loss.  Pt has lost about 5 pounds and has very good attendance.      ITP Comments:     ITP Comments    Row Name 03/11/17 1008           ITP Comments Medical Director, Dr. Armanda Magic          Comments:  Ala is making expected progress toward personal goals after completing 11 sessions. Pt with great attendance after a stalled start with the encouragement of his cardiologist. 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  Pt is comfortable within his lifestyle and feels supported by his family. Pt is often seen dancing and singing along with the music playing in the gym. Recommend continued exercise and life style modification education including  stress management and relaxation techniques to decrease cardiac risk profile. Alanson Aly, BSN Cardiac and Emergency planning/management officer

## 2017-05-07 ENCOUNTER — Encounter (HOSPITAL_COMMUNITY): Payer: Medicare HMO

## 2017-05-07 ENCOUNTER — Encounter (HOSPITAL_COMMUNITY)
Admission: RE | Admit: 2017-05-07 | Discharge: 2017-05-07 | Disposition: A | Discharge status: Home / Self Care | Payer: Medicare HMO | Source: Ambulatory Visit | Attending: Interventional Cardiology | Admitting: Interventional Cardiology

## 2017-05-07 VITALS — Wt 182.3 lb

## 2017-05-07 DIAGNOSIS — I2111 ST elevation (STEMI) myocardial infarction involving right coronary artery: Secondary | ICD-10-CM | POA: Diagnosis not present

## 2017-05-07 DIAGNOSIS — Z955 Presence of coronary angioplasty implant and graft: Secondary | ICD-10-CM

## 2017-05-09 ENCOUNTER — Encounter (HOSPITAL_COMMUNITY): Payer: Medicare HMO

## 2017-05-10 ENCOUNTER — Emergency Department (HOSPITAL_COMMUNITY): Payer: Medicare HMO

## 2017-05-10 ENCOUNTER — Encounter (HOSPITAL_COMMUNITY): Payer: Self-pay | Admitting: *Deleted

## 2017-05-10 ENCOUNTER — Emergency Department (HOSPITAL_COMMUNITY)
Admission: EM | Admit: 2017-05-10 | Discharge: 2017-05-10 | Disposition: A | Discharge status: Home / Self Care | Payer: Medicare HMO | Attending: Emergency Medicine | Admitting: Emergency Medicine

## 2017-05-10 DIAGNOSIS — M25531 Pain in right wrist: Secondary | ICD-10-CM

## 2017-05-10 DIAGNOSIS — M25562 Pain in left knee: Secondary | ICD-10-CM | POA: Diagnosis not present

## 2017-05-10 DIAGNOSIS — I252 Old myocardial infarction: Secondary | ICD-10-CM | POA: Diagnosis not present

## 2017-05-10 DIAGNOSIS — Z87891 Personal history of nicotine dependence: Secondary | ICD-10-CM | POA: Insufficient documentation

## 2017-05-10 DIAGNOSIS — Z79899 Other long term (current) drug therapy: Secondary | ICD-10-CM | POA: Diagnosis not present

## 2017-05-10 DIAGNOSIS — I2510 Atherosclerotic heart disease of native coronary artery without angina pectoris: Secondary | ICD-10-CM | POA: Diagnosis not present

## 2017-05-10 DIAGNOSIS — Z7982 Long term (current) use of aspirin: Secondary | ICD-10-CM | POA: Diagnosis not present

## 2017-05-10 DIAGNOSIS — I5043 Acute on chronic combined systolic (congestive) and diastolic (congestive) heart failure: Secondary | ICD-10-CM | POA: Insufficient documentation

## 2017-05-10 DIAGNOSIS — B20 Human immunodeficiency virus [HIV] disease: Secondary | ICD-10-CM | POA: Diagnosis not present

## 2017-05-10 DIAGNOSIS — G8929 Other chronic pain: Secondary | ICD-10-CM | POA: Diagnosis not present

## 2017-05-10 DIAGNOSIS — R1032 Left lower quadrant pain: Secondary | ICD-10-CM | POA: Diagnosis not present

## 2017-05-10 NOTE — ED Provider Notes (Signed)
[facility]-EMERGENCY DEPT Provider Note   CSN: [id number] Arrival date & time: 05/10/17  1042     History   Chief Complaint Chief Complaint  Patient presents with  . Leg Pain  . Joint Swelling  . Groin Pain    HPI Omar Sandoval is a 61 y.o. male who presents with right wrist pain, left knee pain, left sided groin pain. PMH significant for HIV, CAD, CHF, HLD, AIN grade 3.  Right wrist - He had a distal radial fracture after a fall in November. He had surgery on it by Dr. Melvyn Novas. For one month he has had constant, gradually worsening pain over the lateral and volar aspect of the wrist.   Left knee - He has had chronic pain in the left knee for "awhile". He reports pain with ROM of knee. He describes his pain as a "pain". It is worse in the morning. Denies any prior surgical intervention to the knee. Nothing makes it better.   Left groin -  He first noticed left sided groin pain and swelling on Monday (5 days ago). He states that he felt a "knot" in the area. He doesn't know if the swelling has been coming and going because he has not paid attention to it. He reports that going down steps and coughing makes it worse. He reports it feels like a "falling or dropping". 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 He denies fever, inability to have a BM or pass gas.  HPI  Past Medical History:  Diagnosis Date  . HIV (human immunodeficiency virus infection) (HCC)   . Immune deficiency disorder [facility])     Patient Active Problem List   Diagnosis Date Noted  . Acute on chronic combined systolic and diastolic CHF (congestive heart failure) (HCC) 02/28/2017  . Hyperlipidemia 02/06/2017  . CAD (coronary artery disease) 02/06/2017  . 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 HIV disease (HCC) 01/06/2013  . 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 COLONOSCOPY    . CORONARY/GRAFT ACUTE MI  REVASCULARIZATION N/A 02/03/2017   Procedure: Coronary/Graft Acute MI Revascularization;  Surgeon: [name], MD;  Location: [facility] INVASIVE CV LAB;  Service: Cardiovascular;  Laterality: N/A;  . FRACTURE SURGERY     left ankle and right foot  . HERNIA REPAIR    . LEFT HEART CATH AND CORONARY ANGIOGRAPHY N/A 02/03/2017   Procedure: Left Heart Cath and Coronary Angiography;  Surgeon: [name], MD;  Location: [facility] INVASIVE CV LAB;  Service: Cardiovascular;  Laterality: N/A;  . OPEN REDUCTION INTERNAL FIXATION (ORIF) DISTAL RADIAL FRACTURE Right 07/20/2016   Procedure: OPEN REDUCTION INTERNAL FIXATION (ORIF) DISTAL RADIAL FRACTURE;  Surgeon: [name], MD;  Location: [facility] OR;  Service: Orthopedics;  Laterality: Right;  . SURGERY SCROTAL / TESTICULAR     nondecended testes       Home Medications    Prior to Admission medications   Medication Sig Start Date End Date Taking? Authorizing Provider  Abacavir-Dolutegravir-Lamivud (TRIUMEQ) 600-50-300 MG TABS Take 1 tablet by mouth daily.  03/03/14   [provider]  aspirin 81 MG chewable tablet Chew 1 tablet (81 mg total) by mouth daily. 02/07/17   Arty Baumgartner, NP  atorvastatin (LIPITOR) 40 MG tablet Take 1 tablet (40 mg total) by mouth daily at 6 PM. 04/29/17   [name], MD  metoprolol succinate (TOPROL-XL)  25 MG 24 hr tablet Take 0.5 tablets (12.5 mg total) by mouth 2 (two) times daily. 04/04/17   [name], MD  nitroGLYCERIN (NITROSTAT) 0.4 MG SL tablet Place 1 tablet (0.4 mg total) under the tongue every 5 (five) minutes as needed. 02/06/17   Arty Baumgartner, NP  oxyCODONE-acetaminophen (PERCOCET) 10-325 MG per tablet Take 0.5-1 tablets by mouth every 4 (four) hours as needed for pain. Patient taking differently: Take 1 tablet by mouth every 4 (four) hours as needed for pain.  06/21/13   Arthor Captain, PA-C  ticagrelor (BRILINTA) 90 MG TABS tablet Take 1 tablet (90 mg total) by mouth 2 (two) times daily. 02/12/17    [name], MD    Family History Family History  Problem Relation Age of Onset  . Cancer Mother        lung  . Cancer Brother        lung, liver, and colon    Social History Social History  Substance Use Topics  . Smoking status: Former Smoker    Packs/day: 0.00    Types: Cigarettes  . Smokeless tobacco: Never Used  . Alcohol use 1.8 oz/week    3 Cans of beer per week     Comment: beer daily     Allergies   Chocolate; Lactose intolerance (gi); and Vicodin [hydrocodone-acetaminophen]   Review of Systems Review of Systems  Constitutional: Negative for fever.  Gastrointestinal: Positive for abdominal pain. Negative for constipation, diarrhea, nausea and vomiting.  Musculoskeletal: Positive for arthralgias and joint swelling. Negative for gait problem.  Skin: Negative for wound.     Physical Exam Updated Vital Signs BP 105/79   Pulse 72   Temp 98.5 F (36.9 C) (Oral)   Resp 16   SpO2 100%   Physical Exam  Constitutional: He is oriented to person, place, and time. He appears well-developed and well-nourished. No distress.  HENT:  Head: Normocephalic and atraumatic.  Eyes: Pupils are equal, round, and reactive to light. Conjunctivae are normal. Right eye exhibits no discharge. Left eye exhibits no discharge. No scleral icterus.  Neck: Normal range of motion.  Cardiovascular: Normal rate.   Pulmonary/Chest: Effort normal. No respiratory distress.  Abdominal: He exhibits no distension.  Genitourinary:  Genitourinary Comments: No inguinal lymphadenopathy and no obvious inguinal hernia noted. Pain with palpation of inguinal canal and coughing. Normal uncircumcised penis free of lesions or rash. Testicles are nontender. Right testicle is slightly high riding. Normal scrotal appearance. No obvious penile discharge noted. Chaperone ([place], Charity fundraiser) present during exam.    Musculoskeletal:  Right wrist: Mild amount of swelling of the wrist. Tenderness to palpation of  lateral and volar aspect of wrist. FROM. N/V intact.  Left knee: No obvious swelling, deformity, or warmth. No tenderness. FROM but has pain with extension and flexion. Ambulatory    Neurological: He is alert and oriented to person, place, and time.  Skin: Skin is warm and dry.  Psychiatric: He has a normal mood and affect. His behavior is normal.  Nursing note and vitals reviewed.    ED Treatments / Results  Labs (all labs ordered are listed, but only abnormal results are displayed) Labs Reviewed - No data to display  EKG  EKG Interpretation None       Radiology Dg Wrist Complete Right  Result Date: 05/10/2017 CLINICAL DATA:  Wrist pain EXAM: RIGHT WRIST - COMPLETE 3+ VIEW COMPARISON:  Wrist radiograph 07/19/2016 FINDINGS: There is screw and plate fixation of the distal  right radius. No perihardware lucency or other abnormality. The radiocarpal joint is approximated. Chronic fracture of the ulnar styloid remains mildly displaced. Posttraumatic deformity of the distal right ulnar shaft. No acute fracture or dislocation. Normal soft tissues. IMPRESSION: 1. Chronic posttraumatic findings of the right lower wrist, status post internal fixation of the distal radius. No hardware abnormality. 2. No acute fracture or dislocation of the right wrist. Electronically Signed   By: Deatra Robinson M.D.   On: 05/10/2017 13:16   US Scrotum  Result Date: 05/10/2017 CLINICAL DATA:  Left groin pain for 1 week EXAM: SCROTAL ULTRASOUND DOPPLER ULTRASOUND OF THE TESTICLES TECHNIQUE: Complete ultrasound examination of the testicles, epididymis, and other scrotal structures was performed. Color and spectral Doppler ultrasound were also utilized to evaluate blood flow to the testicles. COMPARISON:  None. FINDINGS: Right testicle Measurements: 2.4 x 0.9 x 2.1 cm. There is a lobulated appearance of the right testicle, but no focal abnormality. There is a slightly elevated position of the right testicle,  compatible with history of nondistended right testicle. Left testicle Measurements: 2.5 x 1.1 x 1.8 cm. No mass or microlithiasis visualized. Right epididymis: There are multiple epididymal cysts that measure up to 0.7 x 0.5 x 0.6 cm. Otherwise normal. Left epididymis:  Normal in size and appearance. Hydrocele:  None visualized. Varicocele:  None visualized. Pulsed Doppler interrogation of both testes demonstrates normal low resistance arterial and venous waveforms bilaterally. IMPRESSION: 1. No testicular torsion or other acute abnormality to explain the left-sided groin pain. 2. Lobulated appearance of the right testicle with slightly elevated positioning in the scrotum are likely chronic findings related to the history of undescended right testicle. Electronically Signed   By: Deatra Robinson M.D.   On: 05/10/2017 13:42   [place] Art/ven Flow Abd Pelv Doppler  Result Date: 05/10/2017 CLINICAL DATA:  Left groin pain for 1 week EXAM: SCROTAL ULTRASOUND DOPPLER ULTRASOUND OF THE TESTICLES TECHNIQUE: Complete ultrasound examination of the testicles, epididymis, and other scrotal structures was performed. Color and spectral Doppler ultrasound were also utilized to evaluate blood flow to the testicles. COMPARISON:  None. FINDINGS: Right testicle Measurements: 2.4 x 0.9 x 2.1 cm. There is a lobulated appearance of the right testicle, but no focal abnormality. There is a slightly elevated position of the right testicle, compatible with history of nondistended right testicle. Left testicle Measurements: 2.5 x 1.1 x 1.8 cm. No mass or microlithiasis visualized. Right epididymis: There are multiple epididymal cysts that measure up to 0.7 x 0.5 x 0.6 cm. Otherwise normal. Left epididymis:  Normal in size and appearance. Hydrocele:  None visualized. Varicocele:  None visualized. Pulsed Doppler interrogation of both testes demonstrates normal low resistance arterial and venous waveforms bilaterally. IMPRESSION: 1. No testicular  torsion or other acute abnormality to explain the left-sided groin pain. 2. Lobulated appearance of the right testicle with slightly elevated positioning in the scrotum are likely chronic findings related to the history of undescended right testicle. Electronically Signed   By: Deatra Robinson M.D.   On: 05/10/2017 13:42   Dg Knee Complete 4 Views Left  Result Date: 05/10/2017 CLINICAL DATA:  Right wrist pain, left knee pain, and left groin pain. Pain just below the right thumb. EXAM: LEFT KNEE - COMPLETE 4+ VIEW COMPARISON:  None. FINDINGS: No evidence of fracture, dislocation, or joint effusion. No evidence of arthropathy or other focal bone abnormality. Soft tissues are unremarkable. IMPRESSION: Negative. Electronically Signed   By: Gerome Sam III M.D   On: 05/10/2017  13:13    Procedures Procedures (including critical care time)  Medications Ordered in ED Medications - No data to display   Initial Impression / Assessment and Plan / ED Course  I have reviewed the triage vital signs and the nursing notes.  Pertinent labs & imaging results that were available during my care of the patient were reviewed by me and considered in my medical decision making (see chart for details).  61 year old male presents with various complaints. Vitals are normal. No obvious reason for pain on exam. Xrays of right wrist and left knee are negative. Suspect wrist pain is due to prior trauma/surgery. He states his wrist brace has helped his pain. Encouraged him to wear this. As far as his groin pain there is no obvious hernia. His history is consistent with this however and [place] was negative for torsion. Will give him a rx for inguinal hernia truss and having him follow up with general surgery for evaluation. He was discharged in stable condition.  Final Clinical Impressions(s) / ED Diagnoses   Final diagnoses:  Left knee pain  Right wrist pain  Chronic pain of left knee  Left groin pain    New  Prescriptions New Prescriptions   No medications on file     Bethel Born, PA-C 05/10/17 1425    Little, Ambrose [place], MD 05/10/17 2133

## 2017-05-10 NOTE — Discharge Instructions (Signed)
Please wear inguinal truss to help with groin pain Wear wrist brace to help with wrist pain Follow up with your doctor regarding your pain and make appointment with general surgery regarding your groin pain

## 2017-05-10 NOTE — ED Triage Notes (Signed)
To ED for eval right wrist pain, left knee pain, and left groin pain. States he noticed left groin pain/swelling- no pain just feeling like 'if I had a womb it would be falling'. No injury noted to wrist or knee. Ambulatory without difficulty.

## 2017-05-10 NOTE — ED Notes (Signed)
Patient transported to X-ray 

## 2017-05-12 ENCOUNTER — Encounter (HOSPITAL_COMMUNITY): Payer: Medicare HMO

## 2017-05-14 ENCOUNTER — Encounter (HOSPITAL_COMMUNITY): Payer: Medicare HMO

## 2017-05-16 ENCOUNTER — Ambulatory Visit: Payer: Self-pay | Admitting: General Surgery

## 2017-05-16 ENCOUNTER — Encounter (HOSPITAL_COMMUNITY): Payer: Medicare HMO

## 2017-05-16 ENCOUNTER — Encounter (HOSPITAL_COMMUNITY)
Admission: RE | Admit: 2017-05-16 | Payer: Medicare HMO | Source: Ambulatory Visit | Attending: Interventional Cardiology | Admitting: Interventional Cardiology

## 2017-05-16 NOTE — H&P (Signed)
History of Present Illness Omar Filler MD; 05/16/2017 11:13 AM) The patient is a 61 year old male who presents with an inguinal hernia. Referred by: Dr. Harland Dingwall Chief Complaint: Left inguinal hernia  Patient is a 61 year old male, with a past medical history significant for CHF, hyperlipidemia, history of MI on Brilinta, HIV disease, with a left inguinal hernia. Patient states that he notices there easily over the last month or so. He states that there is some tenderness to the left inguinal area. He also notices a bulge. He states that he was concerned secondary to the fact that he cannot participate in cardiac rehabilitation. Patient recently had an MI in June 2018. He had a stent placed and is on Proventil. Patient sees Dr. Verdis Prime is his cardiologist.  Patient did have a history of an open right inguinal hernia repair at the age of 76. Unsure if mesh was placed at that time.    Diagnostic Studies History Jessie Foot, [place]; 05/16/2017 10:49 AM) Colonoscopy  1-5 years ago  Allergies Jessie Foot, CMA; 05/16/2017 10:50 AM) No Known Drug Allergies 05/16/2017  Medication History Jessie Foot, CMA; 05/16/2017 10:50 AM) Nils Pyle (600-50-300MG  Tablet, Oral) Active. Nitroglycerin (0.4MG  Tab Sublingual, Sublingual) Active. Metoprolol Succinate ER (  Tablet ER 24HR, Oral) Active. DULoxetine HCl (  Capsule DR Part, Oral) Active. Brilinta (  Tablet, Oral) Active. Atorvastatin Calcium (  Tablet, Oral) Active. Oxycodone-Acetaminophen (10-325MG  Tablet, Oral) Active.  Social History Jessie Foot, [place]; 05/16/2017 10:49 AM) Alcohol use  Occasional alcohol use. Illicit drug use  Uses daily. No caffeine use  Tobacco use  Former smoker.  Family History Jessie Foot, [place]; 05/16/2017 10:49 AM) Cancer  Brother, Mother.  Other Problems Jessie Foot, CMA; 05/16/2017 10:49 AM) Back Pain  Congestive Heart Failure  HIV-positive  Myocardial  infarction  Vascular Disease     Review of Systems Omar Filler MD; 05/16/2017 11:10 AM) General Not Present- Appetite Loss, Chills, Fatigue, Fever, Night Sweats, Weight Gain and Weight Loss. Skin Not Present- Change in Wart/Mole, Dryness, Hives, Jaundice, New Lesions, Non-Healing Wounds, Rash and Ulcer. HEENT Not Present- Earache, Hearing Loss, Hoarseness, Nose Bleed, Oral Ulcers, Ringing in the Ears, Seasonal Allergies, Sinus Pain, Sore Throat, Visual Disturbances, Wears glasses/contact lenses and Yellow Eyes. Respiratory Not Present- Bloody sputum, Chronic Cough, Difficulty Breathing, Snoring and Wheezing. Breast Not Present- Breast Mass, Breast Pain, Nipple Discharge and Skin Changes. Cardiovascular Present- Leg Cramps. Not Present- Chest Pain, Difficulty Breathing Lying Down, Palpitations, Rapid Heart Rate, Shortness of Breath and Swelling of Extremities. Gastrointestinal Present- Abdominal Pain and Rectal Pain. Not Present- Bloating, Bloody Stool, Change in Bowel Habits, Chronic diarrhea, Constipation, Difficulty Swallowing, Excessive gas, Gets full quickly at meals, Hemorrhoids, Indigestion, Nausea and Vomiting. Male Genitourinary Present- Painful Urination. Not Present- Blood in Urine, Change in Urinary Stream, Frequency, Impotence, Nocturia, Urgency and Urine Leakage. Musculoskeletal Present- Joint Pain. Not Present- Back Pain, Joint Stiffness, Muscle Pain, Muscle Weakness and Swelling of Extremities. Neurological Present- Weakness. Not Present- Decreased Memory, Fainting, Headaches, Numbness, Seizures, Tingling, Tremor and Trouble walking. Psychiatric Not Present- Anxiety, Bipolar, Change in Sleep Pattern, Depression, Fearful and Frequent crying. Endocrine Not Present- Cold Intolerance, Excessive Hunger, Hair Changes, Heat Intolerance, Hot flashes and New Diabetes. Hematology Present- Blood Thinners, Easy Bruising and HIV. Not Present- Excessive bleeding, Gland problems and  Persistent Infections. All other systems negative  Vitals Jessie Foot CMA; 05/16/2017 10:51 AM) 05/16/2017 10:50 AM Weight: 179.6 lb Height: 66in Body Surface Area: 1.91 m Body Mass Index: 28.99 kg/m  Temp.: 96.59F  Pulse:  61 (Regular)  BP: 120/80 (Sitting, Left Arm, Standard)       Physical Exam Omar Filler MD; 05/16/2017 11:12 AM) The physical exam findings are as follows: Note:Constitutional: No acute distress, conversant, appears stated age  Eyes: Anicteric sclerae, moist conjunctiva, no lid lag  Neck: No thyromegaly, trachea midline, no cervical lymphadenopathy  Lungs: Clear to auscultation biilaterally, normal respiratory effot  Cardiovascular: regular rate & rhythm, no murmurs, no peripheal edema, pedal pulses 2+  GI: Soft, no masses or hepatosplenomegaly, non-tender to palpation  MSK: Normal gait, no clubbing cyanosis, edema  Skin: No rashes, palpation reveals normal skin turgor  Psychiatric: Appropriate judgment and insight, oriented to person, place, and time  Abdomen Inspection Hernias - Inguinal hernia - Left - Reducible.    Assessment & Plan Omar Filler MD; 05/16/2017 11:13 AM) LEFT INGUINAL HERNIA (K40.90) Impression: 61 year old male with a history of CHF, hyperlipidemia, history of CAD and stent placement on Brilinta, HIV disease 1. Patient will require cardiac clearance by Dr. Katrinka Blazing in the [place] to be off his antiplatelet medication prior to scheduling surgery. 2. Patient is a candidate for laparoscopic left inguinal hernia repair with mesh. 3. I discussed with him the risks and benefits of the procedure to include but not limited to: Infection, bleeding, damage to structures, possible recurrence. Patient voiced understanding and wishes to proceed.

## 2017-05-19 ENCOUNTER — Encounter (HOSPITAL_COMMUNITY): Payer: Medicare HMO

## 2017-05-21 ENCOUNTER — Encounter (HOSPITAL_COMMUNITY): Payer: Medicare HMO

## 2017-05-23 ENCOUNTER — Encounter (HOSPITAL_COMMUNITY): Payer: Medicare HMO

## 2017-05-26 ENCOUNTER — Encounter (HOSPITAL_COMMUNITY): Payer: Medicare HMO

## 2017-05-28 ENCOUNTER — Encounter (HOSPITAL_COMMUNITY): Payer: Medicare HMO

## 2017-05-30 ENCOUNTER — Encounter (HOSPITAL_COMMUNITY): Payer: Medicare HMO

## 2017-05-30 ENCOUNTER — Telehealth: Payer: Self-pay | Admitting: Interventional Cardiology

## 2017-05-30 MED ORDER — METOPROLOL SUCCINATE ER 25 MG PO TB24
12.5000 mg | ORAL_TABLET | Freq: Two times a day (BID) | ORAL | 2 refills | Status: DC
Start: 2017-05-30 — End: 2018-04-24

## 2017-05-30 NOTE — Telephone Encounter (Signed)
Pt walk in form received.  Pt had questions about his Metoprolol and surgical clearance.  Advised pt surgical clearance has been sent to [facility] x 2 and he would need to contact them about next steps because we could not advise for him to stop Brilinta as his stent is so new.  Pt also states that he went [facility] to pick up Metoprolol and they told him it was too soon to pick it up.  Called [facility] and spoke with pharmacy rep and he states pt picked up 90 day supply of medication on 04/04/17 so he should not be out.  Spoke with pt and made him aware of what pharmacy said,  Pt states bottle says Metoprolol Succinate 12.5mg  QD, Qty 45.  Advised pt when I spoke with pharmacy they clarified that they had correct prescription for BID.  Advised I will send prescription again incase they actually don't have correct prescription.  Advised pt to take his bottle to the pharmacy with him and show them where it was filled incorrectly last time.  Pt verbalized understanding and was appreciative for call.

## 2017-05-30 NOTE — Telephone Encounter (Signed)
Walk In pt Form-Pt has questions about medications-placed in [employer].

## 2017-06-02 ENCOUNTER — Encounter (HOSPITAL_COMMUNITY): Payer: Medicare HMO

## 2017-06-02 ENCOUNTER — Encounter (HOSPITAL_COMMUNITY): Payer: Self-pay | Admitting: *Deleted

## 2017-06-02 NOTE — Progress Notes (Signed)
Discharge Progress Report  Patient Details  Name: TOBECHUKWU EMMICK MRN: [id number] Date of Birth: 11-26-1955 Referring Provider:     [place] from 03/11/2017 in [place]  Referring Provider  Daneen Schick       Number of Visits:     Within 12 week period of participation  Reason for Discharge:  Early Exit:  Personal Pt needs to have hernia surgery and will not be able to continue in the group exercise program.  Smoking History:  History  Smoking Status  . Former Smoker  . Packs/day: 0.00  . Types: Cigarettes  Smokeless Tobacco  . Never Used    Diagnosis:  No diagnosis found.  ADL UCSD:   Initial Exercise Prescription:   Discharge Exercise Prescription (Final Exercise Prescription Changes):     Exercise Prescription Changes - 05/07/17 0820      Response to Exercise   Blood Pressure (Admit) 120/80   Blood Pressure (Exercise) 124/80   Blood Pressure (Exit) 100/60   Heart Rate (Admit) 76 bpm   Heart Rate (Exercise) 114 bpm   Heart Rate (Exit) 65 bpm   Rating of Perceived Exertion (Exercise) 11   Symptoms none   Duration Continue with 30 min of aerobic exercise without signs/symptoms of physical distress.   Intensity THRR unchanged     Progression   Progression Continue to progress workloads to maintain intensity without signs/symptoms of physical distress.   Average METs 3.6     Resistance Training   Training Prescription Yes   Weight 5lbs   Reps 10-15   Time 10 Minutes     Treadmill   MPH 3.2   Grade 1   Minutes 15   METs 3.89     Recumbant Bike   Level 3.5   Minutes 15   METs 3.3     Home Exercise Plan   Plans to continue exercise at Home (comment)  walking and may go to [facility]   Frequency Add 2 additional days to program exercise sessions.   Initial Home Exercises Provided 04/30/17      Functional Capacity:   Psychological, QOL, Others - Outcomes: PHQ 2/9: Depression  screen PHQ 2/9 04/07/2017  Decreased Interest 0  Down, Depressed, Hopeless 0  PHQ - 2 Score 0    Quality of Life:   Personal Goals: Goals established at orientation with interventions provided to work toward goal.    Personal Goals Discharge:     Goals and Risk Factor Review    Row Name 04/07/17 1613 05/06/17 2234           Core Components/Risk Factors/Patient Goals Review   Personal Goals Review Weight Management/Obesity;Lipids Weight Management/Obesity;Lipids      Review  - Pt is off to a good start with weight loss.  Pt has lost about 5 pounds and has very good attendance.         Exercise Goals and Review:   Nutrition & Weight - Outcomes:      Post Biometrics - 06/04/17 0815       Post  Biometrics   Weight 182 lb 5.1 oz (82.7 kg)      Nutrition:   Nutrition Discharge:   Education Questionnaire Score:   Pt discharged from cardiac rehab program today with completion of  12  exercise sessions in Phase II. Pt maintained fair attendance but did have a delay in starting cardiac rehab for about a month.  Dr. Tamala Julian encouraged  pt to attend which helped pt get started with exercise after he attended orientation.  Pt progressed nicely during his participation in rehab as evidenced by increased MET level. Pt had to stop coming due to his need for having hernia surgery which prevented him from engaging in exercise.

## 2017-06-04 ENCOUNTER — Encounter (HOSPITAL_COMMUNITY): Payer: Medicare HMO

## 2017-06-04 NOTE — Addendum Note (Signed)
Encounter addended by: Jacques Earthly, RD on: 06/04/2017  2:52 PM<BR>    Actions taken: Flowsheet data copied forward, Visit Navigator Flowsheet section accepted

## 2017-06-04 NOTE — Addendum Note (Signed)
Encounter addended by: Warrick Parisian D on: 06/04/2017  8:55 AM<BR>    Actions taken: Visit Navigator Flowsheet section accepted, Flowsheet accepted

## 2017-06-12 ENCOUNTER — Encounter (HOSPITAL_COMMUNITY): Payer: Self-pay | Admitting: *Deleted

## 2017-07-13 NOTE — Progress Notes (Signed)
Cardiology Office Note    Date:  07/14/2017   ID:  Omar Sandoval, DOB 1955/12/08, MRN [id number]  PCP:  Omar Boards, MD  Cardiologist: Lesleigh Noe, MD   Chief Complaint  Patient presents with  . Coronary Artery Disease    History of Present Illness:  Omar Sandoval is a 61 y.o. male with HIV positive syndrome on retroviral therapy, and recent acute coronary syndrome with distal RCA stent implantation June 2018.  [name] is doing well.  He denies angina.  He is having symptoms related to the left inguinal hernia.  He desires to have the hernia repaired.  He has seen [name] at the [facility] surgery.  He has not needed to use nitroglycerin.  Yesterday he did experience a brief episode of occasional palpitation that was frightening to him.  It resolved after he drank a beer at home.   Past Medical History:  Diagnosis Date  . HIV (human immunodeficiency virus infection) (HCC)   . Immune deficiency disorder [facility])     Past Surgical History:  Procedure Laterality Date  . COLONOSCOPY    . Coronary/Graft Acute MI Revascularization N/A 02/03/2017   Performed by Omar Records, MD at [facility] INVASIVE CV LAB  . FRACTURE SURGERY     left ankle and right foot  . HERNIA REPAIR    . Left Heart Cath and Coronary Angiography N/A 02/03/2017   Performed by Omar Records, MD at [facility] INVASIVE CV LAB  . OPEN REDUCTION INTERNAL FIXATION (ORIF) DISTAL RADIAL FRACTURE Right 07/20/2016   Performed by Omar Bienenstock, MD at [facility] OR  . SURGERY SCROTAL / TESTICULAR     nondecended testes    Current Medications: Outpatient Medications Prior to Visit  Medication Sig Dispense Refill  . Abacavir-Dolutegravir-Lamivud (TRIUMEQ) 600-50-300 MG TABS Take 1 tablet by mouth daily.     Omar Sandoval aspirin 81 MG chewable tablet Chew 1 tablet (81 mg total) by mouth daily.    Omar Sandoval atorvastatin (LIPITOR) 40 MG tablet Take 1 tablet (40 mg total) by mouth daily at 6 PM. 90 tablet 2  . metoprolol succinate  (TOPROL-XL) 25 MG 24 hr tablet Take 0.5 tablets (12.5 mg total) by mouth 2 (two) times daily. 90 tablet 2  . nitroGLYCERIN (NITROSTAT) 0.4 MG SL tablet Place 1 tablet (0.4 mg total) under the tongue every 5 (five) minutes as needed. 25 tablet 3  . oxyCODONE-acetaminophen (PERCOCET) 10-325 MG per tablet Take 0.5-1 tablets by mouth every 4 (four) hours as needed for pain. (Patient taking differently: Take 1 tablet by mouth every 4 (four) hours as needed for pain. ) 10 tablet 0  . ticagrelor (BRILINTA) 90 MG TABS tablet Take 1 tablet (90 mg total) by mouth 2 (two) times daily. 60 tablet 10   No facility-administered medications prior to visit.      Allergies:   Chocolate; Lactose intolerance (gi); and Vicodin [hydrocodone-acetaminophen]   Social History   Socioeconomic History  . Marital status: Single    Spouse name: None  . Number of children: None  . Years of education: None  . Highest education level: None  Social Needs  . Financial resource strain: None  . Food insecurity - worry: None  . Food insecurity - inability: None  . Transportation needs - medical: None  . Transportation needs - non-medical: None  Occupational History  . None  Tobacco Use  . Smoking status: Former Smoker    Packs/day: 0.00    Types:  Cigarettes  . Smokeless tobacco: Never Used  Substance and Sexual Activity  . Alcohol use: Yes    Alcohol/week: 1.8 oz    Types: 3 Cans of beer per week    Comment: beer daily  . Drug use: Yes    Types: Marijuana  . Sexual activity: None  Other Topics Concern  . None  Social History Narrative  . None     Family History:  The patient's family history includes Cancer in his brother and mother.   ROS:   Please see the history of present illness.    Left inguinal hernia discomfort.  Left leg swelling. All other systems reviewed and are negative.   PHYSICAL EXAM:   VS:  BP 100/62   Pulse 68   Ht 5\' 9"  (1.753 m)   Wt 190 lb 12.8 oz (86.5 kg)   BMI 28.18 kg/m     GEN: Well nourished, well developed, in no acute distress  HEENT: normal  Neck: no JVD, carotid bruits, or masses Cardiac: RRR; no murmurs, rubs, or gallops,no edema  Respiratory:  clear to auscultation bilaterally, normal work of breathing GI: soft, nontender, nondistended, + BS MS: no deformity or atrophy  Skin: warm and dry, no rash Neuro:  Alert and Oriented x 3, Strength and sensation are intact Psych: euthymic mood, full affect  Wt Readings from Last 3 Encounters:  07/14/17 190 lb 12.8 oz (86.5 kg)  06/04/17 182 lb 5.1 oz (82.7 kg)  04/04/17 182 lb 9.6 oz (82.8 kg)      Studies/Labs Reviewed:   EKG:  EKG performed  Recent Labs: 02/03/2017: B Natriuretic Peptide 83.2; TSH 1.268 02/19/2017: ALT 16; BUN 6; Creatinine, Ser 0.79; Hemoglobin 12.5; Platelets 417; Potassium 4.7; Sodium 140   Lipid Panel    Component Value Date/Time   CHOL 156 02/03/2017 1323   TRIG 39 02/03/2017 1323   HDL 47 02/03/2017 1323   CHOLHDL 3.3 02/03/2017 1323   VLDL 8 02/03/2017 1323   LDLCALC 101 (H) 02/03/2017 1323    Additional studies/ Sandoval that were reviewed today include:  No new functional data.    ASSESSMENT:    1. Coronary artery disease of native artery of native heart with stable angina pectoris (HCC)   2. Acute on chronic combined systolic and diastolic CHF (congestive heart failure) (HCC)   3. HIV disease (HCC)   4. Other hyperlipidemia   5. Left inguinal hernia      PLAN:  In order of problems listed above:  1. [name] is doing well.  He is now 6 months post acute infarct with stent implantation in the right coronary.  He has not had angina.  No nitroglycerin use is occurred.  Overall prognosis from this is stable.  He will need dual antiplatelet therapy until June 2018 but because of a symptomatic left inguinal hernia I do believe it would be okay to pause Brilinta 5 to a week to allow surgery to be performed and then resume the medication.  The best timing would be in  January or later. 2. No evidence of volume overload at this time. 3. Not addressed 4. LDL target should be less than 70.  Most recent LDL in June 2018 was 101.  This should be reevaluated on return. 5. I have basically cleared the patient for upcoming inguinal hernia surgery by Dr. Derrell Lollingamirez.  Starting in January it would be okay to pause the patient's Brilinta for up to a week to allow surgery to be performed.  Clinical follow-up with me in 6 months.  Call if angina.  Cleared for upcoming general anesthesia and inguinal herniorrhaphy.  Can pause Brilinta preceding the surgery for up to 7 days.    Medication Adjustments/Labs and Tests Ordered: Current medicines are reviewed at length with the patient today.  Concerns regarding medicines are outlined above.  Medication changes, Labs and Tests ordered today are listed in the Patient Instructions below. Patient Instructions  Medication Instructions:  Your physician recommends that you continue on your current medications as directed. Please refer to the Current Medication list given to you today.   1) Can hold Brilinta in January for 3-7 days in preparation for your surgery.  Labwork: None  Testing/Procedures: None  Follow-Up: Your physician wants you to follow-up in: 6 months with Dr. Katrinka BlazingSmith.  You will receive a reminder letter in the mail two months in advance. If you don't receive a letter, please call our office to schedule the follow-up appointment.   Any Other Special Instructions Will Be Listed Below (If Applicable).     If you need a refill on your cardiac medications before your next appointment, please call your pharmacy.      Signed, Lesleigh NoeHenry W Jaden Abreu III, MD  07/14/2017 9:26 AM    [facility] [address], [place], [place]  [address] Phone: [phone]; Fax: [phone]

## 2017-07-14 ENCOUNTER — Encounter: Payer: Self-pay | Admitting: Interventional Cardiology

## 2017-07-14 ENCOUNTER — Ambulatory Visit (INDEPENDENT_AMBULATORY_CARE_PROVIDER_SITE_OTHER): Payer: Medicare HMO | Admitting: Interventional Cardiology

## 2017-07-14 VITALS — BP 100/62 | HR 68 | Ht 69.0 in | Wt 190.8 lb

## 2017-07-14 DIAGNOSIS — E7849 Other hyperlipidemia: Secondary | ICD-10-CM

## 2017-07-14 DIAGNOSIS — B20 Human immunodeficiency virus [HIV] disease: Secondary | ICD-10-CM | POA: Diagnosis not present

## 2017-07-14 DIAGNOSIS — K4090 Unilateral inguinal hernia, without obstruction or gangrene, not specified as recurrent: Secondary | ICD-10-CM | POA: Insufficient documentation

## 2017-07-14 DIAGNOSIS — I25118 Atherosclerotic heart disease of native coronary artery with other forms of angina pectoris: Secondary | ICD-10-CM | POA: Diagnosis not present

## 2017-07-14 DIAGNOSIS — I5043 Acute on chronic combined systolic (congestive) and diastolic (congestive) heart failure: Secondary | ICD-10-CM

## 2017-07-14 NOTE — Patient Instructions (Signed)
Medication Instructions:  Your physician recommends that you continue on your current medications as directed. Please refer to the Current Medication list given to you today.   1) Can hold Brilinta in January for 3-7 days in preparation for your surgery.  Labwork: None  Testing/Procedures: None  Follow-Up: Your physician wants you to follow-up in: 6 months with Dr. Katrinka BlazingSmith.  You will receive a reminder letter in the mail two months in advance. If you don't receive a letter, please call our office to schedule the follow-up appointment.   Any Other Special Instructions Will Be Listed Below (If Applicable).     If you need a refill on your cardiac medications before your next appointment, please call your pharmacy.

## 2017-08-21 NOTE — Pre-Procedure Instructions (Signed)
Omar Sandoval  08/21/2017      [facility] [address], [place] - [address] [place] [address] Phone: [phone] Fax: [phone]    Your procedure is scheduled on Jan 2   [facility] Admitting at 630 A.M.  Call this number if you have problems the morning of surgery:  [phone]   Remember:  Do not eat food or drink liquids after midnight.  Take these medicines the morning of surgery with A SIP OF WATER Abacavir- Dolutegravir- Lamivud (Trimeq), Metoprolol Succinate (Toprol-XL), Nitrostat, Oxycodone (Percocet) if needed  Stop aspirin and Brilanta as directed by your Dr.  Stop BC's, Goody's, Herbal medications, Fish Oil, Vitamins, Ibuprofen, Advil, Motrin,    Do not wear jewelry, make-up or nail polish.  Do not wear lotions, powders, or perfumes, or deodorant.  Do not shave 48 hours prior to surgery.  Men may shave face and neck.  Do not bring valuables to the hospital.  [facility] is not responsible for any belongings or valuables.  Contacts, dentures or bridgework may not be worn into surgery.  Leave your suitcase in the car.  After surgery it may be brought to your room.  For patients admitted to the hospital, discharge time will be determined by your treatment team.  Patients discharged the day of surgery will not be allowed to drive home.    Special instructions:  [place] - Preparing for Surgery  Before surgery, you can play an important role.  Because skin is not sterile, your skin needs to be as free of germs as possible.  You can reduce the number of germs on you skin by washing with CHG (chlorahexidine gluconate) soap before surgery.  CHG is an antiseptic cleaner which kills germs and bonds with the skin to continue killing germs even after washing.  Please DO NOT use if you have an allergy to CHG or antibacterial soaps.  If your skin becomes reddened/irritated stop using the CHG and  inform your nurse when you arrive at Short Stay.  Do not shave (including legs and underarms) for at least 48 hours prior to the first CHG shower.  You may shave your face.  Please follow these instructions carefully:   1.  Shower with CHG Soap the night before surgery and the  morning of Surgery.  2.  If you choose to wash your hair, wash your hair first as usual with your  normal shampoo.  3.  After you shampoo, rinse your hair and body thoroughly to remove the  Shampoo.  4.  Use CHG as you would any other liquid soap.  You can apply chg directly  to the skin and wash gently with scrungie or a clean washcloth.  5.  Apply the CHG Soap to your body ONLY FROM THE NECK DOWN.  Do not use on open wounds or open sores.  Avoid contact with your eyes, ears, mouth and genitals (private parts).  Wash genitals (private parts)  with your normal soap.  6.  Wash thoroughly, paying special attention to the area where your surgery  will be performed.  7.  Thoroughly rinse your body with warm water from the neck down.  8.  DO NOT shower/wash with your normal soap after using and rinsing off the CHG Soap.  9.  Pat yourself dry with a clean towel.            10.  Wear clean pajamas.  11.  Place clean sheets on your bed the night of your first shower and do not sleep with pets.  Day of Surgery  Do not apply any lotions/deoderants the morning of surgery.  Please wear clean clothes to the hospital/surgery center.    Please read over the following fact sheets that you were given. Pain Booklet, Coughing and Deep Breathing and Surgical Site Infection Prevention

## 2017-08-22 ENCOUNTER — Other Ambulatory Visit: Payer: Self-pay

## 2017-08-22 ENCOUNTER — Other Ambulatory Visit (HOSPITAL_COMMUNITY): Payer: Self-pay | Admitting: *Deleted

## 2017-08-22 ENCOUNTER — Encounter (HOSPITAL_COMMUNITY): Payer: Self-pay

## 2017-08-22 ENCOUNTER — Encounter (HOSPITAL_COMMUNITY)
Admission: RE | Admit: 2017-08-22 | Discharge: 2017-08-22 | Disposition: A | Discharge status: Home / Self Care | Payer: Medicare HMO | Source: Ambulatory Visit | Attending: General Surgery | Admitting: General Surgery

## 2017-08-22 DIAGNOSIS — R9431 Abnormal electrocardiogram [ECG] [EKG]: Secondary | ICD-10-CM | POA: Diagnosis not present

## 2017-08-22 DIAGNOSIS — R001 Bradycardia, unspecified: Secondary | ICD-10-CM | POA: Diagnosis not present

## 2017-08-22 DIAGNOSIS — Z01818 Encounter for other preprocedural examination: Secondary | ICD-10-CM | POA: Insufficient documentation

## 2017-08-22 DIAGNOSIS — Z01812 Encounter for preprocedural laboratory examination: Secondary | ICD-10-CM | POA: Insufficient documentation

## 2017-08-22 DIAGNOSIS — I4510 Unspecified right bundle-branch block: Secondary | ICD-10-CM | POA: Diagnosis not present

## 2017-08-22 HISTORY — DX: Acute myocardial infarction, unspecified: I21.9

## 2017-08-22 LAB — BASIC METABOLIC PANEL
ANION GAP: 9 (ref 5–15)
BUN: 10 mg/dL (ref 6–20)
CALCIUM: 9.2 mg/dL (ref 8.9–10.3)
CO2: 28 mmol/L (ref 22–32)
Chloride: 99 mmol/L — ABNORMAL LOW (ref 101–111)
Creatinine, Ser: 0.89 mg/dL (ref 0.61–1.24)
GLUCOSE: 101 mg/dL — AB (ref 65–99)
POTASSIUM: 4.2 mmol/L (ref 3.5–5.1)
Sodium: 136 mmol/L (ref 135–145)

## 2017-08-22 LAB — CBC
HEMATOCRIT: 40.8 % (ref 39.0–52.0)
HEMOGLOBIN: 13.5 g/dL (ref 13.0–17.0)
MCH: 33.8 pg (ref 26.0–34.0)
MCHC: 33.1 g/dL (ref 30.0–36.0)
MCV: 102.3 fL — ABNORMAL HIGH (ref 78.0–100.0)
Platelets: 303 10*3/uL (ref 150–400)
RBC: 3.99 MIL/uL — AB (ref 4.22–5.81)
RDW: 13.5 % (ref 11.5–15.5)
WBC: 5 10*3/uL (ref 4.0–10.5)

## 2017-08-22 LAB — PROTIME-INR
INR: 0.97
Prothrombin Time: 12.8 seconds (ref 11.4–15.2)

## 2017-08-22 NOTE — Progress Notes (Signed)
Pt. Has no orders for pre-admission apointment.  Attempted to call office on 08-22-2017 and 08-21-2017. No one answers the phone.

## 2017-08-22 NOTE — Progress Notes (Signed)
Spoke with Toniann FailWendy at [facility].Dr.Ramirez is unavailable today no orders for preadmission

## 2017-08-25 NOTE — Progress Notes (Signed)
Anesthesia Chart Review: Patient is a 61 year old male scheduled for laparoscopic left inguinal hernia repair with mesh on 08/27/17 by Dr. Axel FillerArmando Ramirez.  History includes former smoker (quit '13), HIV, inferior STEMI 02/03/17 (s/p DES mid RCA), chronic combined systolic and diastolic CHF, closed reduction of right mandibular fracture 12/01/99, ORIF right ankle fracture 05/25/08, ORIF right wrist fracture 07/20/16.    His family is not aware of his HIV status. He does not want them to know.  - PCP/ID is Dr. Bartholomew BoardsLuis Barroso [facility] Everywhere). Last visit 04/09/17.  - Cardiologist is Dr. Verdis PrimeHenry Smith. Last visit 07/14/17. He wrote, "He is now 6 months post acute infarct with stent implantation in the right coronary.  He has not had angina.  No nitroglycerin use is occurred.  Overall prognosis from this is stable.  He will need dual antiplatelet therapy until June 2018 but because of a symptomatic left inguinal hernia I do believe it would be okay to pause Brilinta 5 to a week to allow surgery to be performed and then resume the medication. The best timing would be in January or later." Six month cardiology follow-up planned.   Meds include abacavir-dolutegravir-lamivud, ASA 81 mg, Lipitor, Toprol XL, Nitro, Percocet, Brilinta (holding for surgery). PAT RN reported that patient says he is continuing ASA perioperatively.   EKG 08/22/17: SB at 55 bpm, right BBB, inferior infarct (age undetermined).  Echo 02/06/17: Study Conclusions - Left ventricle: Septal mid and basal inferior wall hypokinesis.   The cavity size was normal. Wall thickness was increased in a   pattern of mild LVH. Systolic function was mildly reduced. The   estimated ejection fraction was in the range of 45% to 50%.   Doppler parameters are consistent with both elevated ventricular   end-diastolic filling pressure and elevated left atrial filling   pressure. - Mitral valve: There was mild regurgitation. - Atrial septum: No defect or  patent foramen ovale was identified.  Cardiac cath 02/03/17:  Acute inferior wall ST elevation MI, late presenting (greater than 12 hours) with ongoing pain.  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  Successful PCI and stenting of the mid RCA from 100% obstruction to less than 15% with TIMI grade 3 flow using a 16 x 3.5 mm Synergy DES. Postdilatation final diameter 4.0 mm at high pressure.  70% proximal to mid LAD.  60-70% distal circumflex.  Inferior wall severe hypokinesis. LVEF 40-50%. Elevated LVEDP. Findings are compatible with acute combined diastolic and systolic heart failure. RECOMMENDATIONS:  Dual antiplatelet therapy, aspirin and Brilinta.  Low-dose beta blocker therapy, high intensity statin therapy, and additional therapy is required to control blood pressure.  IV nitroglycerin for 12-24 hours to help with blood pressure and also dilate the microcirculation beyond the stent.  Pharmacy to help us avoid interaction with HIV therapy.  Probable outpatient myocardial perfusion study to exclude LAD territory ischemia.  1V CXR 02/03/17: IMPRESSION: Hypoinflation without acute cardiopulmonary disease.  Preoperative labs noted. Cr 0.89. H/H 13.5/40.8. Glucose 101. INR 0.97. A1c 5.5 on 02/04/17.  If no acute changes then I anticipate that he can proceed as planned.  Velna Ochsllison Chelise Hanger, PA-C [facility] Phone [phone] 08/25/2017 3:08 PM

## 2017-08-26 NOTE — H&P (Signed)
History of Present Illness  The patient is a 62 year old male who presents with an inguinal hernia. Referred by: Dr. Harland Dingwallachael Little Chief Complaint: Left inguinal hernia  Patient is a 62 year old male, with a past medical history significant for CHF, hyperlipidemia, history of MI on Brilinta, HIV disease, with a left inguinal hernia. Patient states that he notices there easily over the last month or so. He states that there is some tenderness to the left inguinal area. He also notices a bulge. He states that he was concerned secondary to the fact that he cannot participate in cardiac rehabilitation. Patient recently had an MI in June 2018. He had a stent placed and is on Proventil. Patient sees Dr. Verdis PrimeHenry Smith is his cardiologist.  Patient did have a history of an open right inguinal hernia repair at the age of 62. Unsure if mesh was placed at that time.    Diagnostic Studies History  Colonoscopy  1-5 years ago  Allergies No Known Drug Allergies 05/16/2017  Medication History  Triumeq (600-50-300MG  Tablet, Oral) Active. Nitroglycerin (0.4MG  Tab Sublingual, Sublingual) Active. Metoprolol Succinate ER (25MG  Tablet ER 24HR, Oral) Active. DULoxetine HCl (60MG  Capsule DR Part, Oral) Active. Brilinta (90MG  Tablet, Oral) Active. Atorvastatin Calcium (40MG  Tablet, Oral) Active. Oxycodone-Acetaminophen (10-325MG  Tablet, Oral) Active.  Social History  Alcohol use  Occasional alcohol use. Illicit drug use  Uses daily. No caffeine use  Tobacco use  Former smoker.  Family History Cancer  Brother, Mother.  Other Problems  Back Pain  Congestive Heart Failure  HIV-positive  Myocardial infarction  Vascular Disease     Review of Systems General Not Present- Appetite Loss, Chills, Fatigue, Fever, Night Sweats, Weight Gain and Weight Loss. Skin Not Present- Change in Wart/Mole, Dryness, Hives, Jaundice, New Lesions, Non-Healing Wounds, Rash and  Ulcer. HEENT Not Present- Earache, Hearing Loss, Hoarseness, Nose Bleed, Oral Ulcers, Ringing in the Ears, Seasonal Allergies, Sinus Pain, Sore Throat, Visual Disturbances, Wears glasses/contact lenses and Yellow Eyes. Respiratory Not Present- Bloody sputum, Chronic Cough, Difficulty Breathing, Snoring and Wheezing. Breast Not Present- Breast Mass, Breast Pain, Nipple Discharge and Skin Changes. Cardiovascular Present- Leg Cramps. Not Present- Chest Pain, Difficulty Breathing Lying Down, Palpitations, Rapid Heart Rate, Shortness of Breath and Swelling of Extremities. Gastrointestinal Present- Abdominal Pain and Rectal Pain. Not Present- Bloating, Bloody Stool, Change in Bowel Habits, Chronic diarrhea, Constipation, Difficulty Swallowing, Excessive gas, Gets full quickly at meals, Hemorrhoids, Indigestion, Nausea and Vomiting. Male Genitourinary Present- Painful Urination. Not Present- Blood in Urine, Change in Urinary Stream, Frequency, Impotence, Nocturia, Urgency and Urine Leakage. Musculoskeletal Present- Joint Pain. Not Present- Back Pain, Joint Stiffness, Muscle Pain, Muscle Weakness and Swelling of Extremities. Neurological Present- Weakness. Not Present- Decreased Memory, Fainting, Headaches, Numbness, Seizures, Tingling, Tremor and Trouble walking. Psychiatric Not Present- Anxiety, Bipolar, Change in Sleep Pattern, Depression, Fearful and Frequent crying. Endocrine Not Present- Cold Intolerance, Excessive Hunger, Hair Changes, Heat Intolerance, Hot flashes and New Diabetes. Hematology Present- Blood Thinners, Easy Bruising and HIV. Not Present- Excessive bleeding, Gland problems and Persistent Infections. All other systems negative      Physical Exam  The physical exam findings are as follows: Note:Constitutional: No acute distress, conversant, appears stated age  Eyes: Anicteric sclerae, moist conjunctiva, no lid lag  Neck: No thyromegaly, trachea midline, no cervical  lymphadenopathy  Lungs: Clear to auscultation biilaterally, normal respiratory effot  Cardiovascular: regular rate & rhythm, no murmurs, no peripheal edema, pedal pulses 2+  GI: Soft, no masses or hepatosplenomegaly, non-tender to palpation  MSK: Normal gait, no clubbing cyanosis, edema  Skin: No rashes, palpation reveals normal skin turgor  Psychiatric: Appropriate judgment and insight, oriented to person, place, and time  Abdomen Inspection Hernias - Inguinal hernia - Left - Reducible.    Assessment & Plan  LEFT INGUINAL HERNIA (K40.90) Impression: 62 year old male with a history of CHF, hyperlipidemia, history of CAD and stent placement on Brilinta, HIV disease 1. Patient will require cardiac clearance by Dr. Katrinka Blazing in the [place] to be off his antiplatelet medication prior to scheduling surgery. 2. Patient is a candidate for laparoscopic left inguinal hernia repair with mesh. 3. I discussed with him the risks and benefits of the procedure to include but not limited to: Infection, bleeding, damage to structures, possible recurrence. Patient voiced understanding and wishes to proceed.

## 2017-08-27 ENCOUNTER — Ambulatory Visit: Payer: Self-pay | Admitting: General Surgery

## 2017-08-27 ENCOUNTER — Encounter (HOSPITAL_COMMUNITY): Payer: Self-pay | Admitting: Certified Registered Nurse Anesthetist

## 2017-08-27 ENCOUNTER — Ambulatory Visit (HOSPITAL_COMMUNITY)
Admission: RE | Admit: 2017-08-27 | Discharge: 2017-08-27 | Disposition: A | Discharge status: Home / Self Care | Payer: Medicare HMO | Source: Ambulatory Visit | Attending: General Surgery | Admitting: General Surgery

## 2017-08-27 ENCOUNTER — Ambulatory Visit (HOSPITAL_COMMUNITY): Payer: Medicare HMO | Admitting: Vascular Surgery

## 2017-08-27 ENCOUNTER — Encounter (HOSPITAL_COMMUNITY)
Admission: RE | Disposition: A | Discharge status: Home / Self Care | Payer: Self-pay | Source: Ambulatory Visit | Attending: General Surgery

## 2017-08-27 DIAGNOSIS — I252 Old myocardial infarction: Secondary | ICD-10-CM | POA: Insufficient documentation

## 2017-08-27 DIAGNOSIS — Z79899 Other long term (current) drug therapy: Secondary | ICD-10-CM | POA: Insufficient documentation

## 2017-08-27 DIAGNOSIS — Z87891 Personal history of nicotine dependence: Secondary | ICD-10-CM | POA: Diagnosis not present

## 2017-08-27 DIAGNOSIS — K4030 Unilateral inguinal hernia, with obstruction, without gangrene, not specified as recurrent: Secondary | ICD-10-CM | POA: Insufficient documentation

## 2017-08-27 DIAGNOSIS — Z955 Presence of coronary angioplasty implant and graft: Secondary | ICD-10-CM | POA: Diagnosis not present

## 2017-08-27 DIAGNOSIS — I2510 Atherosclerotic heart disease of native coronary artery without angina pectoris: Secondary | ICD-10-CM | POA: Diagnosis not present

## 2017-08-27 DIAGNOSIS — E785 Hyperlipidemia, unspecified: Secondary | ICD-10-CM | POA: Diagnosis not present

## 2017-08-27 HISTORY — PX: INGUINAL HERNIA REPAIR: SHX194

## 2017-08-27 HISTORY — DX: Unilateral inguinal hernia, without obstruction or gangrene, not specified as recurrent: K40.90

## 2017-08-27 HISTORY — PX: INSERTION OF MESH: SHX5868

## 2017-08-27 SURGERY — REPAIR, HERNIA, INGUINAL, LAPAROSCOPIC
Anesthesia: General | Site: Abdomen | Laterality: Left

## 2017-08-27 MED ORDER — SODIUM CHLORIDE 0.9% FLUSH: 3.0000 mL | Freq: Two times a day (BID) | INTRAVENOUS | Status: DC

## 2017-08-27 MED ORDER — METOPROLOL SUCCINATE ER 25 MG PO TB24
ORAL_TABLET | ORAL | Status: AC
  Administered 2017-08-27: 12.5 mg via ORAL
  Filled 2017-08-27: qty 1

## 2017-08-27 MED ORDER — DEXAMETHASONE SODIUM PHOSPHATE 10 MG/ML IJ SOLN
INTRAMUSCULAR | Status: DC | PRN
  Administered 2017-08-27: 10 mg via INTRAVENOUS

## 2017-08-27 MED ORDER — METOPROLOL SUCCINATE 12.5 MG HALF TABLET
12.5000 mg | ORAL_TABLET | Freq: Every day | ORAL | Status: DC
  Administered 2017-08-27: 12.5 mg via ORAL
  Filled 2017-08-27: qty 1

## 2017-08-27 MED ORDER — FENTANYL CITRATE (PF) 100 MCG/2ML IJ SOLN
25.0000 ug | INTRAMUSCULAR | Status: DC | PRN
  Administered 2017-08-27 (×3): 50 ug via INTRAVENOUS

## 2017-08-27 MED ORDER — SODIUM CHLORIDE 0.9 % IV SOLN: 250.0000 mL | INTRAVENOUS | Status: DC | PRN

## 2017-08-27 MED ORDER — 0.9 % SODIUM CHLORIDE (POUR BTL) OPTIME
TOPICAL | Status: DC | PRN
  Administered 2017-08-27: 1000 mL

## 2017-08-27 MED ORDER — PROPOFOL 10 MG/ML IV BOLUS
INTRAVENOUS | Status: AC
  Filled 2017-08-27: qty 20

## 2017-08-27 MED ORDER — ONDANSETRON HCL 4 MG/2ML IJ SOLN
INTRAMUSCULAR | Status: DC | PRN
  Administered 2017-08-27: 4 mg via INTRAVENOUS

## 2017-08-27 MED ORDER — ACETAMINOPHEN 325 MG PO TABS
650.0000 mg | ORAL_TABLET | ORAL | Status: DC | PRN
  Administered 2017-08-27: 650 mg via ORAL

## 2017-08-27 MED ORDER — MORPHINE SULFATE (PF) 2 MG/ML IV SOLN: 2.0000 mg | INTRAVENOUS | Status: DC | PRN

## 2017-08-27 MED ORDER — ONDANSETRON HCL 4 MG/2ML IJ SOLN
INTRAMUSCULAR | Status: AC
  Filled 2017-08-27: qty 2

## 2017-08-27 MED ORDER — BUPIVACAINE HCL (PF) 0.25 % IJ SOLN
INTRAMUSCULAR | Status: AC
  Filled 2017-08-27: qty 30

## 2017-08-27 MED ORDER — SODIUM CHLORIDE 0.9% FLUSH: 3.0000 mL | INTRAVENOUS | Status: DC | PRN

## 2017-08-27 MED ORDER — LIDOCAINE HCL (CARDIAC) 20 MG/ML IV SOLN
INTRAVENOUS | Status: DC | PRN
  Administered 2017-08-27: 40 mg via INTRATRACHEAL

## 2017-08-27 MED ORDER — MIDAZOLAM HCL 2 MG/2ML IJ SOLN
INTRAMUSCULAR | Status: DC | PRN
  Administered 2017-08-27 (×2): 1 mg via INTRAVENOUS

## 2017-08-27 MED ORDER — SUGAMMADEX SODIUM 200 MG/2ML IV SOLN
INTRAVENOUS | Status: DC | PRN
  Administered 2017-08-27: 200 mg via INTRAVENOUS

## 2017-08-27 MED ORDER — PROPOFOL 10 MG/ML IV BOLUS
INTRAVENOUS | Status: DC | PRN
  Administered 2017-08-27: 140 mg via INTRAVENOUS

## 2017-08-27 MED ORDER — FENTANYL CITRATE (PF) 100 MCG/2ML IJ SOLN
INTRAMUSCULAR | Status: AC
  Filled 2017-08-27: qty 2

## 2017-08-27 MED ORDER — MIDAZOLAM HCL 2 MG/2ML IJ SOLN
INTRAMUSCULAR | Status: AC
  Filled 2017-08-27: qty 2

## 2017-08-27 MED ORDER — BUPIVACAINE HCL 0.25 % IJ SOLN
INTRAMUSCULAR | Status: DC | PRN
  Administered 2017-08-27: 6 mL

## 2017-08-27 MED ORDER — SUGAMMADEX SODIUM 200 MG/2ML IV SOLN
INTRAVENOUS | Status: AC
  Filled 2017-08-27: qty 2

## 2017-08-27 MED ORDER — MORPHINE SULFATE (PF) 4 MG/ML IV SOLN
INTRAVENOUS | Status: AC
  Administered 2017-08-27: 2 mg
  Filled 2017-08-27: qty 1

## 2017-08-27 MED ORDER — OXYCODONE HCL 5 MG PO TABS
5.0000 mg | ORAL_TABLET | ORAL | Status: DC | PRN
  Administered 2017-08-27: 10 mg via ORAL

## 2017-08-27 MED ORDER — ACETAMINOPHEN 650 MG RE SUPP: 650.0000 mg | RECTAL | Status: DC | PRN

## 2017-08-27 MED ORDER — FENTANYL CITRATE (PF) 250 MCG/5ML IJ SOLN
INTRAMUSCULAR | Status: AC
Start: 2017-08-27 — End: ?
  Filled 2017-08-27: qty 5

## 2017-08-27 MED ORDER — ARTIFICIAL TEARS OPHTHALMIC OINT
TOPICAL_OINTMENT | OPHTHALMIC | Status: AC
  Filled 2017-08-27: qty 3.5

## 2017-08-27 MED ORDER — OXYCODONE HCL 5 MG PO TABS
ORAL_TABLET | ORAL | Status: AC
  Administered 2017-08-27: 10 mg via ORAL
  Filled 2017-08-27: qty 2

## 2017-08-27 MED ORDER — DEXAMETHASONE SODIUM PHOSPHATE 10 MG/ML IJ SOLN
INTRAMUSCULAR | Status: AC
  Filled 2017-08-27: qty 1

## 2017-08-27 MED ORDER — ACETAMINOPHEN 325 MG PO TABS
ORAL_TABLET | ORAL | Status: AC
  Filled 2017-08-27: qty 2

## 2017-08-27 MED ORDER — CEFAZOLIN SODIUM-DEXTROSE 2-3 GM-%(50ML) IV SOLR
INTRAVENOUS | Status: DC | PRN
  Administered 2017-08-27: 2 g via INTRAVENOUS

## 2017-08-27 MED ORDER — GLYCOPYRROLATE 0.2 MG/ML IJ SOLN
INTRAMUSCULAR | Status: DC | PRN
  Administered 2017-08-27: 0.2 mg via INTRAVENOUS

## 2017-08-27 MED ORDER — LIDOCAINE 2% (20 MG/ML) 5 ML SYRINGE
INTRAMUSCULAR | Status: AC
  Filled 2017-08-27: qty 5

## 2017-08-27 MED ORDER — LACTATED RINGERS IV SOLN
INTRAVENOUS | Status: DC | PRN
  Administered 2017-08-27 (×2): via INTRAVENOUS

## 2017-08-27 MED ORDER — OXYCODONE-ACETAMINOPHEN 5-325 MG PO TABS: 1.0000 | ORAL_TABLET | Freq: Four times a day (QID) | ORAL | 0 refills | Status: DC | PRN

## 2017-08-27 MED ORDER — FENTANYL CITRATE (PF) 100 MCG/2ML IJ SOLN
INTRAMUSCULAR | Status: AC
  Administered 2017-08-27: 50 ug via INTRAVENOUS
  Filled 2017-08-27: qty 2

## 2017-08-27 MED ORDER — ARTIFICIAL TEARS OPHTHALMIC OINT
TOPICAL_OINTMENT | OPHTHALMIC | Status: DC | PRN
  Administered 2017-08-27: 1 via OPHTHALMIC

## 2017-08-27 MED ORDER — FENTANYL CITRATE (PF) 250 MCG/5ML IJ SOLN
INTRAMUSCULAR | Status: DC | PRN
  Administered 2017-08-27 (×2): 100 ug via INTRAVENOUS

## 2017-08-27 MED ORDER — ONDANSETRON HCL 4 MG/2ML IJ SOLN: 4.0000 mg | Freq: Once | INTRAMUSCULAR | Status: DC | PRN

## 2017-08-27 MED ORDER — ROCURONIUM BROMIDE 100 MG/10ML IV SOLN
INTRAVENOUS | Status: DC | PRN
  Administered 2017-08-27: 50 mg via INTRAVENOUS

## 2017-08-27 SURGICAL SUPPLY — 41 items
APPLIER CLIP 5 13 M/L LIGAMAX5 (MISCELLANEOUS) ×4
BENZOIN TINCTURE PRP APPL 2/3 (GAUZE/BANDAGES/DRESSINGS) ×4 IMPLANT
CANISTER SUCT 3000ML PPV (MISCELLANEOUS) IMPLANT
CHLORAPREP W/TINT 26ML (MISCELLANEOUS) ×4 IMPLANT
CLIP APPLIE 5 13 M/L LIGAMAX5 (MISCELLANEOUS) ×2 IMPLANT
CLOSURE WOUND 1/2 X4 (GAUZE/BANDAGES/DRESSINGS) ×1
COVER SURGICAL LIGHT HANDLE (MISCELLANEOUS) ×4 IMPLANT
DISSECTOR BLUNT TIP ENDO 5MM (MISCELLANEOUS) IMPLANT
ELECT REM PT RETURN 9FT ADLT (ELECTROSURGICAL) ×4
ELECTRODE REM PT RTRN 9FT ADLT (ELECTROSURGICAL) ×2 IMPLANT
GAUZE SPONGE 2X2 8PLY STRL LF (GAUZE/BANDAGES/DRESSINGS) ×2 IMPLANT
GLOVE BIO SURGEON STRL SZ7.5 (GLOVE) ×4 IMPLANT
GOWN STRL REUS W/ TWL LRG LVL3 (GOWN DISPOSABLE) ×4 IMPLANT
GOWN STRL REUS W/ TWL XL LVL3 (GOWN DISPOSABLE) ×2 IMPLANT
GOWN STRL REUS W/TWL LRG LVL3 (GOWN DISPOSABLE) ×4
GOWN STRL REUS W/TWL XL LVL3 (GOWN DISPOSABLE) ×2
KIT BASIN OR (CUSTOM PROCEDURE TRAY) ×4 IMPLANT
KIT ROOM TURNOVER OR (KITS) ×4 IMPLANT
MESH 3DMAX 5X7 LT XLRG (Mesh General) ×4 IMPLANT
NEEDLE INSUFFLATION 14GA 120MM (NEEDLE) ×4 IMPLANT
NS IRRIG 1000ML POUR BTL (IV SOLUTION) ×4 IMPLANT
PAD ARMBOARD 7.5X6 YLW CONV (MISCELLANEOUS) ×8 IMPLANT
RELOAD STAPLE HERNIA 4.0 BLUE (INSTRUMENTS) ×4 IMPLANT
RELOAD STAPLE HERNIA 4.8 BLK (STAPLE) IMPLANT
SCISSORS LAP 5X35 DISP (ENDOMECHANICALS) ×4 IMPLANT
SET IRRIG TUBING LAPAROSCOPIC (IRRIGATION / IRRIGATOR) ×4 IMPLANT
SET TROCAR LAP APPLE-HUNT 5MM (ENDOMECHANICALS) ×4 IMPLANT
SPONGE GAUZE 2X2 STER 10/PKG (GAUZE/BANDAGES/DRESSINGS) ×2
STAPLER HERNIA 12 8.5 360D (INSTRUMENTS) ×4 IMPLANT
STRIP CLOSURE SKIN 1/2X4 (GAUZE/BANDAGES/DRESSINGS) ×3 IMPLANT
SUT MNCRL AB 4-0 PS2 18 (SUTURE) ×4 IMPLANT
SUT VIC AB 1 CT1 27 (SUTURE)
SUT VIC AB 1 CT1 27XBRD ANBCTR (SUTURE) IMPLANT
SYRINGE TOOMEY DISP (SYRINGE) ×4 IMPLANT
TOWEL OR 17X24 6PK STRL BLUE (TOWEL DISPOSABLE) ×4 IMPLANT
TOWEL OR 17X26 10 PK STRL BLUE (TOWEL DISPOSABLE) ×4 IMPLANT
TRAY FOLEY CATH SILVER 14FR (SET/KITS/TRAYS/PACK) ×4 IMPLANT
TRAY LAPAROSCOPIC MC (CUSTOM PROCEDURE TRAY) ×4 IMPLANT
TROCAR XCEL 12X100 BLDLESS (ENDOMECHANICALS) ×4 IMPLANT
TUBING INSUFFLATION (TUBING) ×4 IMPLANT
WATER STERILE IRR 1000ML POUR (IV SOLUTION) ×4 IMPLANT

## 2017-08-27 NOTE — Discharge Instructions (Signed)
[facility] _______[facility] °INGUINAL HERNIA REPAIR: POST OP INSTRUCTIONS ° °Always review your discharge instruction sheet given to you by the facility where your surgery was performed. °IF YOU HAVE DISABILITY OR FAMILY LEAVE FORMS, YOU MUST BRING THEM TO THE OFFICE FOR PROCESSING.   °DO NOT GIVE THEM TO YOUR DOCTOR. ° °1. A  prescription for pain medication may be given to you upon discharge.  Take your pain medication as prescribed, if needed.  If narcotic pain medicine is not needed, then you may take acetaminophen (Tylenol) or ibuprofen (Advil) as needed. °2. Take your usually prescribed medications unless otherwise directed. °If you need a refill on your pain medication, please contact your pharmacy.  They will contact our office to request authorization. Prescriptions will not be filled after 5 pm or on week-ends. °3. You should follow a light diet the first 24 hours after arrival home, such as soup and crackers, etc.  Be sure to include lots of fluids daily.  Resume your normal diet the day after surgery. °4.Most patients will experience some swelling and bruising around the umbilicus or in the groin and scrotum.  Ice packs and reclining will help.  Swelling and bruising can take several days to resolve.  °6. It is common to experience some constipation if taking pain medication after surgery.  Increasing fluid intake and taking a stool softener (such as Colace) will usually help or prevent this problem from occurring.  A mild laxative (Milk of Magnesia or Miralax) should be taken according to package directions if there are no bowel movements after 48 hours. °7. Unless discharge instructions indicate otherwise, you may remove your bandages 24-48 hours after surgery, and you may shower at that time.  You may have steri-strips (small skin tapes) in place directly over the incision.  These strips should be left on the skin for 7-10 days.  If your surgeon used skin glue on the incision, you may  shower in 24 hours.  The glue will flake off over the next 2-3 weeks.  Any sutures or staples will be removed at the office during your follow-up visit. °8. ACTIVITIES:  You may resume regular (light) daily activities beginning the next day--such as daily self-care, walking, climbing stairs--gradually increasing activities as tolerated.  You may have sexual intercourse when it is comfortable.  Refrain from any heavy lifting or straining until approved by your doctor. ° °a.You may drive when you are no longer taking prescription pain medication, you can comfortably wear a seatbelt, and you can safely maneuver your car and apply brakes. °b.RETURN TO WORK:   °_____________________________________________ ° °9.You should see your doctor in the office for a follow-up appointment approximately 2-3 weeks after your surgery.  Make sure that you call for this appointment within a day or two after you arrive home to insure a convenient appointment time. °10.OTHER INSTRUCTIONS: _________________________ °   _____________________________________ ° °WHEN TO CALL YOUR DOCTOR: °1. Fever over 101.0 °2. Inability to urinate °3. Nausea and/or vomiting °4. Extreme swelling or bruising °5. Continued bleeding from incision. °6. Increased pain, redness, or drainage from the incision ° °The clinic staff is available to answer your questions during regular business hours.  Please don’t hesitate to call and ask to speak to one of the nurses for clinical concerns.  If you have a medical emergency, go to the nearest emergency room or call 911.  A surgeon from [facility] is always on call at the hospital ° ° °[address], [address], [place], [place]  [address] ? ° [address] [address], [place], [place]   [address] °[phone] ? [phone] ? FAX [phone] °Web site: [email or web address] ° °

## 2017-08-27 NOTE — Op Note (Signed)
08/27/2017  9:31 AM  PATIENT:  Omar GoldsPaul R Fredman  62 y.o. male  PRE-OPERATIVE DIAGNOSIS:  LEFT INGUINAL HERNIA  POST-OPERATIVE DIAGNOSIS:  LEFT INCARCERATED INDIRECT INGUINAL HERNIA  PROCEDURE:  Procedure(s): LAPAROSCOPIC LEFT INGUINAL HERNIA REPAIR WITH MESH (Left) INSERTION OF MESH (Left)  SURGEON:  Surgeon(s) and Role:    Axel Filler* Jeylin Woodmansee, MD - Primary  ANESTHESIA:   local and general  EBL:  5cc   BLOOD ADMINISTERED:none  DRAINS: none   LOCAL MEDICATIONS USED:  BUPIVICAINE   SPECIMEN:  No Specimen  DISPOSITION OF SPECIMEN:  N/A  COUNTS:  YES  TOURNIQUET:  * No tourniquets in log *  DICTATION: .Dragon Dictation  Counts: reported as correct x 2  Findings:  The patient had a large left indirect hernia  Indications for procedure:  The patient is a 62 year old male with a left hernia for several months. Patient complained of symptomatology to his left inguinal area. The patient was taken back for elective inguinal hernia repair.  Details of the procedure: The patient was taken back to the operating room. The patient was placed in supine position with bilateral SCDs in place.  The patient was prepped and draped in the usual sterile fashion.  After appropriate anitbiotics were confirmed, a time-out was confirmed and all facts were verified.  0.25% Marcaine was used to infiltrate the umbilical area. A 11-blade was used to cut down the skin and blunt dissection was used to get the anterior fashion.  The anterior fascia was incised approximately 1 cm and the muscles were retracted laterally. Blunt dissection was then used to create a space in the preperitoneal area. At this time a 10 mm camera was then introduced into the space and advanced the pubic tubercle and a 12 mm trocar was placed over this and insufflation was started.  At this time and space was created from medial to laterally the preperitoneal space.  Cooper's ligament was initially cleaned off.  The hernia sac was  identified in the indirect space. Dissection of the hernia sac was undertaken the vas deferens was identified and protected in all parts of the case.    Once the hernia sac was taken down to approximately the umbilicus a Bard 3D Max mesh, size: X-Large, was  introduced into the preperitoneal space.  The mesh was brought over to cover the direct and indirect hernia spaces.  This was anchored into place and secured to Cooper's ligament with 4.130mm staples from a Coviden hernia stapler. It was anchored to the anterior abdominal wall with 4.8 mm staples. The hernia sac was seen lying posterior to the mesh. There was no staples placed laterally. The insufflation was evacuated and the peritoneum was seen posterior to the mesh. The trochars were removed. The anterior fascia was reapproximated using #1 Vicryl on a UR- 6.  Intra-abdominal air was evacuated and the Veress needle removed. The skin was reapproximated using 4-0 Monocryl subcuticular fashion the patient was awakened from general anesthesia and taken to recovery in stable condition.   PLAN OF CARE: Discharge to home after PACU  PATIENT DISPOSITION:  PACU - hemodynamically stable.   Delay start of Pharmacological VTE agent (>24hrs) due to surgical blood loss or risk of bleeding: not applicable

## 2017-08-27 NOTE — Anesthesia Procedure Notes (Signed)
Procedure Name: Intubation Date/Time: 08/27/2017 8:36 AM Performed by: Roberts Gaudy, MD Pre-anesthesia Checklist: Patient identified, Emergency Drugs available, Suction available and Patient being monitored Patient Re-evaluated:Patient Re-evaluated prior to induction Oxygen Delivery Method: Circle system utilized Preoxygenation: Pre-oxygenation with 100% oxygen Induction Type: IV induction Ventilation: Mask ventilation without difficulty Laryngoscope Size: Mac and 3 Grade View: Grade II Tube type: Oral Tube size: 7.0 mm Number of attempts: 1 Placement Confirmation: ETT inserted through vocal cords under direct vision,  positive ETCO2 and breath sounds checked- equal and bilateral Secured at: 22 cm Tube secured with: Tape Dental Injury: Teeth and Oropharynx as per pre-operative assessment

## 2017-08-27 NOTE — Anesthesia Preprocedure Evaluation (Addendum)
Anesthesia Evaluation  Patient identified by MRN, date of birth, ID band Patient awake    Reviewed: Allergy & Precautions, NPO status , Patient's Chart, lab work & pertinent test results  Airway Mallampati: II  TM Distance: >3 FB Neck ROM: Full    Dental  (+) Teeth Intact, Dental Advisory Given, Partial Lower, Partial Upper,    Pulmonary former smoker,    breath sounds clear to auscultation       Cardiovascular  Rhythm:Regular Rate:Normal     Neuro/Psych    GI/Hepatic   Endo/Other    Renal/GU      Musculoskeletal   Abdominal   Peds  Hematology   Anesthesia Other Findings   Reproductive/Obstetrics                           Anesthesia Physical Anesthesia Plan  ASA: III  Anesthesia Plan: General   Post-op Pain Management:    Induction: Intravenous  PONV Risk Score and Plan: Ondansetron and Dexamethasone  Airway Management Planned: Oral ETT  Additional Equipment:   Intra-op Plan:   Post-operative Plan: Extubation in OR  Informed Consent: I have reviewed the patients History and Physical, chart, labs and discussed the procedure including the risks, benefits and alternatives for the proposed anesthesia with the patient or authorized representative who has indicated his/her understanding and acceptance.   Dental advisory given  Plan Discussed with: CRNA and Anesthesiologist  Anesthesia Plan Comments:        Anesthesia Quick Evaluation

## 2017-08-27 NOTE — Transfer of Care (Signed)
Immediate Anesthesia Transfer of Care Note  Patient: Omar Sandoval  Procedure(s) Performed: LAPAROSCOPIC LEFT INGUINAL HERNIA REPAIR WITH MESH (Left Abdomen) INSERTION OF MESH (Left )  Patient Location: PACU  Anesthesia Type:General  Level of Consciousness: awake, alert  and patient cooperative  Airway & Oxygen Therapy: Patient Spontanous Breathing and Patient connected to face mask oxygen  Post-op Assessment: Report given to RN, Post -op Vital signs reviewed and stable, Patient moving all extremities X 4 and Patient able to stick tongue midline  Post vital signs: Reviewed and stable  Last Vitals:  Vitals:   08/27/17 0620 08/27/17 0815  BP: 117/80 (!) 122/106  Pulse: 74 66  Resp: 18   Temp: 36.7 C   SpO2: 100%     Last Pain:  Vitals:   08/27/17 0620  TempSrc: Oral      Patients Stated Pain Goal: 1 (08/27/17 0654)  Complications: No apparent anesthesia complications

## 2017-08-27 NOTE — Anesthesia Postprocedure Evaluation (Signed)
Anesthesia Post Note  Patient: Omar Sandoval  Procedure(s) Performed: LAPAROSCOPIC LEFT INGUINAL HERNIA REPAIR WITH MESH (Left Abdomen) INSERTION OF MESH (Left )     Patient location during evaluation: PACU Anesthesia Type: General Level of consciousness: awake, awake and alert and oriented Pain management: pain level controlled Vital Signs Assessment: post-procedure vital signs reviewed and stable Respiratory status: spontaneous breathing, nonlabored ventilation and respiratory function stable Cardiovascular status: blood pressure returned to baseline Postop Assessment: no headache Anesthetic complications: no    Last Vitals:  Vitals:   08/27/17 1100 08/27/17 1145  BP: (!) 121/91 106/78  Pulse: 81 69  Resp: 19 16  Temp: 36.6 C 36.7 C  SpO2: 97% 96%    Last Pain:  Vitals:   08/27/17 1125  TempSrc:   PainSc: Asleep                 Verland Sprinkle COKER

## 2017-08-28 ENCOUNTER — Encounter (HOSPITAL_COMMUNITY): Payer: Self-pay | Admitting: General Surgery

## 2017-09-09 ENCOUNTER — Ambulatory Visit (INDEPENDENT_AMBULATORY_CARE_PROVIDER_SITE_OTHER): Payer: Medicare HMO | Admitting: Interventional Cardiology

## 2017-09-09 ENCOUNTER — Encounter: Payer: Self-pay | Admitting: Interventional Cardiology

## 2017-09-09 VITALS — BP 134/82 | HR 50 | Ht 68.0 in | Wt 191.2 lb

## 2017-09-09 DIAGNOSIS — E782 Mixed hyperlipidemia: Secondary | ICD-10-CM | POA: Diagnosis not present

## 2017-09-09 DIAGNOSIS — R002 Palpitations: Secondary | ICD-10-CM

## 2017-09-09 DIAGNOSIS — I252 Old myocardial infarction: Secondary | ICD-10-CM | POA: Diagnosis not present

## 2017-09-09 DIAGNOSIS — I25119 Atherosclerotic heart disease of native coronary artery with unspecified angina pectoris: Secondary | ICD-10-CM | POA: Diagnosis not present

## 2017-09-09 DIAGNOSIS — I5043 Acute on chronic combined systolic (congestive) and diastolic (congestive) heart failure: Secondary | ICD-10-CM | POA: Diagnosis not present

## 2017-09-09 NOTE — Patient Instructions (Signed)
Medication Instructions:  Your physician recommends that you continue on your current medications as directed. Please refer to the Current Medication list given to you today.  Labwork: None  Testing/Procedures: Your physician has recommended that you wear an event monitor. Event monitors are medical devices that record the heart's electrical activity. Doctors most often us these monitors to diagnose arrhythmias. Arrhythmias are problems with the speed or rhythm of the heartbeat. The monitor is a small, portable device. You can wear one while you do your normal daily activities. This is usually used to diagnose what is causing palpitations/syncope (passing out).   Follow-Up: Your physician wants you to follow-up in: May 2019 with Dr. Katrinka BlazingSmith.  You will receive a reminder letter in the mail two months in advance. If you don't receive a letter, please call our office to schedule the follow-up appointment.   Any Other Special Instructions Will Be Listed Below (If Applicable).     If you need a refill on your cardiac medications before your next appointment, please call your pharmacy.

## 2017-09-09 NOTE — Progress Notes (Signed)
Cardiology Office Note    Date:  09/09/2017   ID:  ARVO EALY, DOB 09/24/1955, MRN [id number]  PCP:  Bartholomew Boards, MD  Cardiologist: Lesleigh Noe, MD   Chief Complaint  Patient presents with  . Palpitations    History of Present Illness:  Omar Sandoval is a 62 y.o. male with HIV positive syndrome on retroviral therapy, and recent acute coronary syndrome with distal RCA stent implantation June 2018   All complaints of intermittent episodes of feeling irregularity in heartbeat, anxiety, slight dizziness, and needing to sit down with these episodes occur.  They are totally random.  There is no associated shortness of breath or chest pain.  It is been occurring on a relatively frequent basis at least several times per week.  He is worried that this has something to do with his stent.  He had an inferior infarction in June and has reduced LV function with inferior wall motion abnormality on echocardiography.  The right coronary artery was stented.  The LAD contains 70% proximal obstruction.   Past Medical History:  Diagnosis Date  . HIV (human immunodeficiency virus infection) (HCC)   . Immune deficiency disorder (HCC)   . Inguinal hernia    Left  . Myocardial infarction [facility])    01-2017 angioplasty with stents    Past Surgical History:  Procedure Laterality Date  . COLONOSCOPY    . CORONARY ANGIOPLASTY    . CORONARY/GRAFT ACUTE MI REVASCULARIZATION N/A 02/03/2017   Procedure: Coronary/Graft Acute MI Revascularization;  Surgeon: [name], MD;  Location: [facility] INVASIVE CV LAB;  Service: Cardiovascular;  Laterality: N/A;  . FRACTURE SURGERY     left ankle and right foot  . HERNIA REPAIR    . INGUINAL HERNIA REPAIR Left 08/27/2017   Procedure: LAPAROSCOPIC LEFT INGUINAL HERNIA REPAIR WITH MESH;  Surgeon: Axel Filler, MD;  Location: [facility] OR;  Service: General;  Laterality: Left;  . INSERTION OF MESH Left 08/27/2017   Procedure: INSERTION OF MESH;  Surgeon: Axel Filler,  MD;  Location: [facility] OR;  Service: General;  Laterality: Left;  . LEFT HEART CATH AND CORONARY ANGIOGRAPHY N/A 02/03/2017   Procedure: Left Heart Cath and Coronary Angiography;  Surgeon: [name], MD;  [facility];  Service: Cardiovascular;  Laterality: N/A;  . OPEN REDUCTION INTERNAL FIXATION (ORIF) DISTAL RADIAL FRACTURE Right 07/20/2016   Procedure: OPEN REDUCTION INTERNAL FIXATION (ORIF) DISTAL RADIAL FRACTURE;  Surgeon: [name], MD;  Location: [facility] OR;  Service: Orthopedics;  Laterality: Right;  . SURGERY SCROTAL / TESTICULAR     nondecended testes    Current Medications: Outpatient Medications Prior to Visit  Medication Sig Dispense Refill  . Abacavir-Dolutegravir-Lamivud (TRIUMEQ) 600-50-300 MG TABS Take 1 tablet by mouth daily.     [name] aspirin 81 MG chewable tablet Chew 1 tablet (81 mg total) by mouth daily.    [name] atorvastatin (LIPITOR) 40 MG tablet Take 1 tablet (40 mg total) by mouth daily at 6 PM. 90 tablet 2  . metoprolol succinate (TOPROL-XL) 25 MG 24 hr tablet Take 0.5 tablets (12.5 mg total) by mouth 2 (two) times daily. 90 tablet 2  . nitroGLYCERIN (NITROSTAT) 0.4 MG SL tablet Place 1 tablet (0.4 mg total) under the tongue every 5 (five) minutes as needed. (Patient taking differently: Place 0.4 mg under the tongue every 5 (five) minutes as needed for chest pain. ) 25 tablet 3  . oxyCODONE-acetaminophen (PERCOCET) 10-325 MG per tablet Take 0.5-1 tablets  by mouth every 4 (four) hours as needed for pain. (Patient taking differently: Take 1 tablet by mouth every 4 (four) hours as needed for pain. ) 10 tablet 0  . oxyCODONE-acetaminophen (ROXICET) 5-325 MG tablet Take 1 tablet by mouth every 6 (six) hours as needed. 20 tablet 0  . ticagrelor (BRILINTA) 90 MG TABS tablet Take 1 tablet (90 mg total) by mouth 2 (two) times daily. 60 tablet 10   No facility-administered medications prior to visit.      Allergies:   Chocolate; Lactose intolerance (gi); and Vicodin  [hydrocodone-acetaminophen]   Social History   Socioeconomic History  . Marital status: Single    Spouse name: None  . Number of children: None  . Years of education: None  . Highest education level: None  Social Needs  . Financial resource strain: None  . Food insecurity - worry: None  . Food insecurity - inability: None  . Transportation needs - medical: None  . Transportation needs - non-medical: None  Occupational History  . None  Tobacco Use  . Smoking status: Former Smoker    Packs/day: 0.50    Years: 45.00    Pack years: 22.50    Types: Cigarettes    Last attempt to quit: 2013    Years since quitting: 6.0  . Smokeless tobacco: Never Used  Substance and Sexual Activity  . Alcohol use: Yes    Alcohol/week: 1.8 oz    Types: 3 Cans of beer per week    Comment: beer daily  . Drug use: Yes    Types: Marijuana    Comment: used  Marijuana  . Sexual activity: None  Other Topics Concern  . None  Social History Narrative  . None     Family History:  The patient's family history includes Cancer in his brother and mother.   ROS:   Please see the history of present illness.    Anxiety concerning his overall cardiac condition. All other systems reviewed and are negative.   PHYSICAL EXAM:   VS:  BP 134/82   Pulse (!) 50   Ht 5\' 8"  (1.727 m)   Wt 191 lb 3.2 oz (86.7 kg)   BMI 29.07 kg/m    GEN: Well nourished, well developed, in no acute distress  HEENT: normal  Neck: no JVD, carotid bruits, or masses Cardiac:Irregular rhythm that sounds like premature ventricular or atrial beats; no murmurs, rubs, or gallops,no edema. Respiratory:  clear to auscultation bilaterally, normal work of breathing GI: soft, nontender, nondistended, + BS MS: no deformity or atrophy  Skin: warm and dry, no rash Neuro:  Alert and Oriented x 3, Strength and sensation are intact Psych: euthymic mood, full affect  Wt Readings from Last 3 Encounters:  09/09/17 191 lb 3.2 oz (86.7 kg)    08/27/17 187 lb (84.8 kg)  08/22/17 187 lb 6.3 oz (85 kg)      Studies/Labs Reviewed:   EKG:  EKG  Not performed  Recent Labs: 02/03/2017: B Natriuretic Peptide 83.2; TSH 1.268 02/19/2017: ALT 16 08/22/2017: BUN 10; Creatinine, Ser 0.89; Hemoglobin 13.5; Platelets 303; Potassium 4.2; Sodium 136   Lipid Panel    Component Value Date/Time   CHOL 156 02/03/2017 1323   TRIG 39 02/03/2017 1323   HDL 47 02/03/2017 1323   CHOLHDL 3.3 02/03/2017 1323   VLDL 8 02/03/2017 1323   LDLCALC 101 (H) 02/03/2017 1323    Additional studies/ records that were reviewed today include:  2D Doppler echocardiogram June 2018:  Study Conclusions   - Left ventricle: Septal mid and basal inferior wall hypokinesis.   The cavity size was normal. Wall thickness was increased in a   pattern of mild LVH. Systolic function was mildly reduced. The   estimated ejection fraction was in the range of 45% to 50%.   Doppler parameters are consistent with both elevated ventricular   end-diastolic filling pressure and elevated left atrial filling   pressure. - Mitral valve: There was mild regurgitation. - Atrial septum: No defect or patent foramen ovale was identified.    Cardiac catheterization June 2018: Coronary Diagrams   Diagnostic Diagram       Post-Intervention Diagram           ASSESSMENT:    1. Palpitations   2. Coronary artery disease involving native coronary artery of native heart with angina pectoris (HCC)   3. Mixed hyperlipidemia   4. 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 Acute on chronic combined systolic and diastolic CHF (congestive heart failure) (HCC)      PLAN:  In order of problems listed above:  1. Rule out ventricular ectopy related to scar versus atrial arrhythmia such as atrial fibrillation or SVT.  For this reason a 30-day monitor will be done to provide correlation between his complaint and heart rhythm. 2. Relatively stable without angina.  He does have  residual 70% LAD disease.  Will need close follow-up and may need ischemic evaluation depending upon monitor findings. 3. LDL cholesterol target less than 70. 4. No further discussion 5. No evidence of volume overload  Already has an appointment set 4-5 months.  He will wear a 30-day monitor.  Further evaluation will be arranged if abnormalities of significance are noted on the monitor.  No medication adjustments are made today.    Medication Adjustments/Labs and Tests Ordered: Current medicines are reviewed at length with the patient today.  Concerns regarding medicines are outlined above.  Medication changes, Labs and Tests ordered today are listed in the Patient Instructions below. Patient Instructions  Medication Instructions:  Your physician recommends that you continue on your current medications as directed. Please refer to the Current Medication list given to you today.  Labwork: None  Testing/Procedures: Your physician has recommended that you wear an event monitor. Event monitors are medical devices that record the heart's electrical activity. Doctors most often [place] these monitors to diagnose arrhythmias. Arrhythmias are problems with the speed or rhythm of the heartbeat. The monitor is a small, portable device. You can wear one while you do your normal daily activities. This is usually used to diagnose what is causing palpitations/syncope (passing out).   Follow-Up: Your physician wants you to follow-up in: May 2019 with Dr. Katrinka Blazing.  You will receive a reminder letter in the mail two months in advance. If you don't receive a letter, please call our office to schedule the follow-up appointment.   Any Other Special Instructions Will Be Listed Below (If Applicable).     If you need a refill on your cardiac medications before your next appointment, please call your pharmacy.      Signed, Lesleigh Noe, MD  09/09/2017 12:06 PM    [facility] [address] [place], [place], [place]  [address] Phone: [phone]; Fax: [phone]

## 2017-09-16 ENCOUNTER — Ambulatory Visit (INDEPENDENT_AMBULATORY_CARE_PROVIDER_SITE_OTHER): Payer: Medicare HMO

## 2017-09-16 ENCOUNTER — Telehealth: Payer: Self-pay | Admitting: *Deleted

## 2017-09-16 DIAGNOSIS — R002 Palpitations: Secondary | ICD-10-CM | POA: Diagnosis not present

## 2017-09-16 NOTE — Telephone Encounter (Signed)
Patient needs letter from doctor stating he needs a first floor apartment.  The stairs are too much for him to climb.  He would also appreciate a letter allowing him to use [employer] services.  Please call.

## 2017-09-16 NOTE — Telephone Encounter (Addendum)
Spoke with pt and he states he gets winded with climbing so many steps to get to his upper level apt.  Pt wanted to know if Dr. Katrinka BlazingSmith would write a letter stating pt needs a first floor apt d/t his heart conditions (CHF, CAD and previous MI)?    Asked pt if he has filled out application for [employer] yet.  He said no.  Advised him to contact them and get that filled out to see if he qualifies to use [employer].    Advised I will send message to Dr. Katrinka BlazingSmith to see if ok to write letter in regards to first floor apt?

## 2017-09-17 NOTE — Telephone Encounter (Signed)
To whom it may concern,  Mr. Omar Goldsaul R.  Sandoval (date of birth 09/26/1955) has coronary artery disease and diastolic heart failure.  Please consider granting him a first floor apartment.  His heart condition makes it difficult for him to climb stairs.  The effort required me because difficulty with his heart.  If I may provide further information do not hesitate to contact me.   Sincerely,   Darci NeedleHenry W.  B.  Tristian Bouska, III, MD

## 2017-09-18 ENCOUNTER — Encounter: Payer: Self-pay | Admitting: *Deleted

## 2017-09-18 NOTE — Telephone Encounter (Signed)
Letter written and pt notified.  Pt will come by tomorrow to pick it up.

## 2017-12-29 ENCOUNTER — Encounter: Payer: Self-pay | Admitting: Interventional Cardiology

## 2017-12-29 NOTE — Progress Notes (Signed)
Cardiology Office Note    Date:  12/30/2017   ID:  Omar Sandoval, DOB [birth year], MRN [id number]  PCP:  Bartholomew Boards, MD  Cardiologist: Lesleigh Noe, MD   Chief Complaint  Patient presents with  . Coronary Artery Disease  . Palpitations    History of Present Illness:  Omar Sandoval is a 62 y.o. male with HIV positive syndrome on retroviral therapy, and recent acute coronary syndrome with distal RCA stent implantation June 2018   Patient is experiencing sporadic palpitations.  Other than awareness of the irregular rhythm, he has no complaints.  He denies angina.  He has a part-time job at [employer].  He has not needed nitroglycerin.  He is active but not exercising on a regular basis.  He denies tachycardia/prolonged sustained rhythm disturbance.   Past Medical History:  Diagnosis Date  . HIV (human immunodeficiency virus infection) (HCC)   . Immune deficiency disorder (HCC)   . Inguinal hernia    Left  . Myocardial infarction [facility])    01-2017 angioplasty with stents    Past Surgical History:  Procedure Laterality Date  . COLONOSCOPY    . CORONARY ANGIOPLASTY    . CORONARY/GRAFT ACUTE MI REVASCULARIZATION N/A 02/03/2017   Procedure: Coronary/Graft Acute MI Revascularization;  Surgeon: [name], MD;  Location: [facility] INVASIVE CV LAB;  Service: Cardiovascular;  Laterality: N/A;  . FRACTURE SURGERY     left ankle and right foot  . HERNIA REPAIR    . INGUINAL HERNIA REPAIR Left 08/27/2017   Procedure: LAPAROSCOPIC LEFT INGUINAL HERNIA REPAIR WITH MESH;  Surgeon: Axel Filler, MD;  Location: [facility] OR;  Service: General;  Laterality: Left;  . INSERTION OF MESH Left 08/27/2017   Procedure: INSERTION OF MESH;  Surgeon: Axel Filler, MD;  Location: [facility] OR;  Service: General;  Laterality: Left;  . LEFT HEART CATH AND CORONARY ANGIOGRAPHY N/A 02/03/2017   Procedure: Left Heart Cath and Coronary Angiography;  Surgeon: [name], MD;  Location: [facility] INVASIVE CV LAB;   Service: Cardiovascular;  Laterality: N/A;  . OPEN REDUCTION INTERNAL FIXATION (ORIF) DISTAL RADIAL FRACTURE Right 07/20/2016   Procedure: OPEN REDUCTION INTERNAL FIXATION (ORIF) DISTAL RADIAL FRACTURE;  Surgeon: [name], MD;  Location: [facility] OR;  Service: Orthopedics;  Laterality: Right;  . SURGERY SCROTAL / TESTICULAR     nondecended testes    Current Medications: Outpatient Medications Prior to Visit  Medication Sig Dispense Refill  . Abacavir-Dolutegravir-Lamivud (TRIUMEQ) 600-50-300 MG TABS Take 1 tablet by mouth daily.     [name] aspirin 81 MG chewable tablet Chew 1 tablet (81 mg total) by mouth daily.    [name] atorvastatin (LIPITOR) 40 MG tablet Take 1 tablet (40 mg total) by mouth daily at 6 PM. 90 tablet 2  . metoprolol succinate (TOPROL-XL) 25 MG 24 hr tablet Take 0.5 tablets (12.5 mg total) by mouth 2 (two) times daily. 90 tablet 2  . nitroGLYCERIN (NITROSTAT) 0.4 MG SL tablet Place 0.4 mg under the tongue every 5 (five) minutes x 3 doses as needed for chest pain.    [name] oxyCODONE-acetaminophen (PERCOCET) 10-325 MG per tablet Take 0.5-1 tablets by mouth every 4 (four) hours as needed for pain. (Patient taking differently: Take 1 tablet by mouth every 4 (four) hours as needed for pain. ) 10 tablet 0  . oxyCODONE-acetaminophen (ROXICET) 5-325 MG tablet Take 1 tablet by mouth every 6 (six) hours as needed. 20 tablet 0  . ticagrelor (BRILINTA) 90 MG TABS tablet  Take 1 tablet (90 mg total) by mouth 2 (two) times daily. 60 tablet 10  . nitroGLYCERIN (NITROSTAT) 0.4 MG SL tablet Place 1 tablet (0.4 mg total) under the tongue every 5 (five) minutes as needed. (Patient not taking: Reported on 12/30/2017) 25 tablet 3   No facility-administered medications prior to visit.      Allergies:   Chocolate; Lactose intolerance (gi); and Vicodin [hydrocodone-acetaminophen]   Social History   Socioeconomic History  . Marital status: Single    Spouse name: Not on file  . Number of children: Not on file  .  Years of education: Not on file  . Highest education level: Not on file  Occupational History  . Not on file  Social Needs  . Financial resource strain: Not on file  . Food insecurity:    Worry: Not on file    Inability: Not on file  . Transportation needs:    Medical: Not on file    Non-medical: Not on file  Tobacco Use  . Smoking status: Former Smoker    Packs/day: 0.50    Years: 45.00    Pack years: 22.50    Types: Cigarettes    Last attempt to quit: 2013    Years since quitting: 6.3  . Smokeless tobacco: Never Used  Substance and Sexual Activity  . Alcohol use: Yes    Alcohol/week: 1.8 oz    Types: 3 Cans of beer per week    Comment: beer daily  . Drug use: Yes    Types: Marijuana    Comment: used  Marijuana  . Sexual activity: Not on file  Lifestyle  . Physical activity:    Days per week: Not on file    Minutes per session: Not on file  . Stress: Not on file  Relationships  . Social connections:    Talks on phone: Not on file    Gets together: Not on file    Attends religious service: Not on file    Active member of club or organization: Not on file    Attends meetings of clubs or organizations: Not on file    Relationship status: Not on file  Other Topics Concern  . Not on file  Social History Narrative  . Not on file     Family History:  The patient's family history includes Cancer in his brother and mother.   ROS:   Please see the history of present illness.    Occasional leg swelling, leg pain, back pain, muscle pain, and skipped heartbeats All other systems reviewed and are negative.   PHYSICAL EXAM:   VS:  BP 116/76   Pulse 65   Ht  (1.676 m)   Wt 197 lb 12.8 oz (89.7 kg)   BMI 31.93 kg/m    GEN: Well nourished, well developed, in no acute distress  HEENT: normal  Neck: no JVD, carotid bruits, or masses Cardiac: RRR; no murmurs, rubs, or gallops,no edema  Respiratory:  clear to auscultation bilaterally, normal work of breathing GI:  soft, nontender, nondistended, + BS MS: no deformity or atrophy  Skin: warm and dry, no rash Neuro:  Alert and Oriented x 3, Strength and sensation are intact Psych: euthymic mood, full affect  Wt Readings from Last 3 Encounters:  12/30/17 197 lb 12.8 oz (89.7 kg)  09/09/17 191 lb 3.2 oz (86.7 kg)  08/27/17 187 lb (84.8 kg)      Studies/Labs Reviewed:   EKG:  EKG heart tracing is not  repeated.  Recent Labs: 02/03/2017: B Natriuretic Peptide 83.2; TSH 1.268 02/19/2017: ALT 16 08/22/2017: BUN 10; Creatinine, Ser 0.89; Hemoglobin 13.5; Platelets 303; Potassium 4.2; Sodium 136   Lipid Panel    Component Value Date/Time   CHOL 156 02/03/2017 1323   TRIG 39 02/03/2017 1323   HDL 47 02/03/2017 1323   CHOLHDL 3.3 02/03/2017 1323   VLDL 8 02/03/2017 1323   LDLCALC 101 (H) 02/03/2017 1323    Additional studies/ records that were reviewed today include:  No new functional data.  Cardiac monitor January 2019: Study Highlights     Basic underlying rhythm is normal sinus rhythm  Premature ventricular contractions occasionally in a bigeminal pattern.  Fluttering and palpitations correlate with premature ventricular contractions.  PVCs frequently present without complaints.   Basic underlying rhythm is normal sinus rhythm. Isolated PVCs occasionally and trigeminy and bigeminy correlate with patient's complaints no sustained arrhythmias or profound bradycardia.      ASSESSMENT:    1. Coronary artery disease involving native coronary artery of native heart with angina pectoris (HCC)   2. HIV disease (HCC)   3. Mixed hyperlipidemia   4. Chronic combined systolic and diastolic heart failure (HCC)      PLAN:  In order of problems listed above:  1. No angina or anginal-like symptoms.  Discussed nitroglycerin use.  He does have residual 70% LAD disease.  If he develops any chest discomfort or dyspnea will need to reassess for evidence of ischemia.  No ischemia testing of  the LAD lesion was performed. 2. Presumed stable and followed chronically. 3. LDL target less than 70. 4. Echo LVEF 45 to 50%.  Add lisinopril 2.5 mg/day.  Basic metabolic panel in 2 weeks.  Increase physical activity.  Continue low-dose beta-blocker therapy.  Have not been able to further titrate beta-blocker because of somatic complaints.  Clinical follow-up in 4 months with APP and Dr. Katrinka Blazing in 8 months.  Medication Adjustments/Labs and Tests Ordered: Current medicines are reviewed at length with the patient today.  Concerns regarding medicines are outlined above.  Medication changes, Labs and Tests ordered today are listed in the Patient Instructions below. Patient Instructions  Medication Instructions:  1) START Lisinopril 2.5mg  once daily  Labwork: Your physician recommends that you return for lab work in: 1 month (BMET)   Testing/Procedures: None  Follow-Up: Your physician wants you to follow-up in: 6 months with Dr. Katrinka Blazing.  You will receive a reminder letter in the mail two months in advance. If you don't receive a letter, please call our office to schedule the follow-up appointment.   Any Other Special Instructions Will Be Listed Below (If Applicable).     If you need a refill on your cardiac medications before your next appointment, please call your pharmacy.      Signed, Lesleigh Noe, MD  12/30/2017 1:02 PM    [facility] [address] [place], [place], [place]  [address] Phone: [phone]; Fax: [phone]

## 2017-12-30 ENCOUNTER — Encounter: Payer: Self-pay | Admitting: *Deleted

## 2017-12-30 ENCOUNTER — Ambulatory Visit (INDEPENDENT_AMBULATORY_CARE_PROVIDER_SITE_OTHER): Payer: Medicare HMO | Admitting: Interventional Cardiology

## 2017-12-30 ENCOUNTER — Encounter: Payer: Self-pay | Admitting: Interventional Cardiology

## 2017-12-30 VITALS — BP 116/76 | HR 65 | Ht 66.0 in | Wt 197.8 lb

## 2017-12-30 DIAGNOSIS — I25119 Atherosclerotic heart disease of native coronary artery with unspecified angina pectoris: Secondary | ICD-10-CM

## 2017-12-30 DIAGNOSIS — B20 Human immunodeficiency virus [HIV] disease: Secondary | ICD-10-CM | POA: Diagnosis not present

## 2017-12-30 DIAGNOSIS — E782 Mixed hyperlipidemia: Secondary | ICD-10-CM | POA: Diagnosis not present

## 2017-12-30 DIAGNOSIS — I493 Ventricular premature depolarization: Secondary | ICD-10-CM

## 2017-12-30 DIAGNOSIS — I5042 Chronic combined systolic (congestive) and diastolic (congestive) heart failure: Secondary | ICD-10-CM

## 2017-12-30 MED ORDER — LISINOPRIL 2.5 MG PO TABS: 2.5000 mg | ORAL_TABLET | Freq: Every day | ORAL | 3 refills | Status: DC

## 2017-12-30 NOTE — Patient Instructions (Signed)
Medication Instructions:  1) START Lisinopril 2.5mg  once daily  Labwork: Your physician recommends that you return for lab work in: 1 month (BMET)   Testing/Procedures: None  Follow-Up: Your physician wants you to follow-up in: 6 months with Dr. Katrinka Blazing.  You will receive a reminder letter in the mail two months in advance. If you don't receive a letter, please call our office to schedule the follow-up appointment.   Any Other Special Instructions Will Be Listed Below (If Applicable).     If you need a refill on your cardiac medications before your next appointment, please call your pharmacy.

## 2018-01-19 DIAGNOSIS — F70 Mild intellectual disabilities: Secondary | ICD-10-CM | POA: Insufficient documentation

## 2018-01-19 DIAGNOSIS — F1210 Cannabis abuse, uncomplicated: Secondary | ICD-10-CM | POA: Insufficient documentation

## 2018-01-19 DIAGNOSIS — F1010 Alcohol abuse, uncomplicated: Secondary | ICD-10-CM | POA: Insufficient documentation

## 2018-01-30 ENCOUNTER — Other Ambulatory Visit: Payer: Medicare HMO

## 2018-03-19 ENCOUNTER — Other Ambulatory Visit: Payer: Self-pay | Admitting: Interventional Cardiology

## 2018-03-19 MED ORDER — NITROGLYCERIN 0.4 MG SL SUBL: 0.4000 mg | SUBLINGUAL_TABLET | SUBLINGUAL | 6 refills | Status: DC | PRN

## 2018-03-19 NOTE — Telephone Encounter (Signed)
°*  STAT* If patient is at the pharmacy, call can be transferred to refill team.   1. Which medications need to be refilled? (please list name of each medication and dose if known)need a new prescription for Nitroglycerin  2. Which pharmacy/location (including street and city if local pharmacy) is medication to be sent to? [facility] [place]  3. Do they need a 30 day or 90 day supply? 1 bottle

## 2018-03-19 NOTE — Telephone Encounter (Signed)
Pt's medication was sent to pt's pharmacy as requested. Confirmation received.  °

## 2018-03-26 ENCOUNTER — Other Ambulatory Visit: Payer: Self-pay | Admitting: Interventional Cardiology

## 2018-04-24 ENCOUNTER — Other Ambulatory Visit: Payer: Self-pay | Admitting: Interventional Cardiology

## 2018-04-24 MED ORDER — METOPROLOL SUCCINATE ER 25 MG PO TB24: ORAL_TABLET | ORAL | 2 refills | Status: DC

## 2018-04-24 NOTE — Addendum Note (Signed)
Addended by: Valrie HartISLEY, Kaidan Spengler L on: 04/24/2018 05:33 PM   Modules accepted: Orders

## 2018-05-15 ENCOUNTER — Encounter (HOSPITAL_COMMUNITY): Payer: Self-pay

## 2018-05-15 ENCOUNTER — Emergency Department (HOSPITAL_COMMUNITY): Payer: Medicare HMO

## 2018-05-15 ENCOUNTER — Emergency Department (HOSPITAL_COMMUNITY)
Admission: EM | Admit: 2018-05-15 | Discharge: 2018-05-15 | Disposition: A | Discharge status: Home / Self Care | Payer: Medicare HMO | Attending: Emergency Medicine | Admitting: Emergency Medicine

## 2018-05-15 DIAGNOSIS — I2510 Atherosclerotic heart disease of native coronary artery without angina pectoris: Secondary | ICD-10-CM | POA: Insufficient documentation

## 2018-05-15 DIAGNOSIS — Z79899 Other long term (current) drug therapy: Secondary | ICD-10-CM | POA: Insufficient documentation

## 2018-05-15 DIAGNOSIS — M79601 Pain in right arm: Secondary | ICD-10-CM

## 2018-05-15 DIAGNOSIS — Z955 Presence of coronary angioplasty implant and graft: Secondary | ICD-10-CM | POA: Insufficient documentation

## 2018-05-15 DIAGNOSIS — Z87891 Personal history of nicotine dependence: Secondary | ICD-10-CM | POA: Diagnosis not present
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
## 2018-05-15 NOTE — Discharge Instructions (Signed)
Your x-ray today was reassuring, there are no bony abnormalities.  Your symptoms are likely due to muscle pain. Use heat or ice, whichever makes it feel better. You may try muscle creams, such as Salonpas, icy hot, or BenGay. Use Tylenol or ibuprofen as needed for pain. Return to the emergency room if you are dropping things with your hand, you have persistent numbness of your hand, or any new or concerning symptoms.

## 2018-05-15 NOTE — ED Notes (Signed)
Pt verbalized understanding of discharge instructions and denies any further questions at this time.   

## 2018-06-26 IMAGING — DX DG WRIST COMPLETE 3+V*R*
4 series · 4 of 4 positions shown · non-contrast
Comparison: None.

CLINICAL DATA: Right wrist pain status post fall.

EXAM:
RIGHT WRIST - COMPLETE 3+ VIEW

[wrist pa]
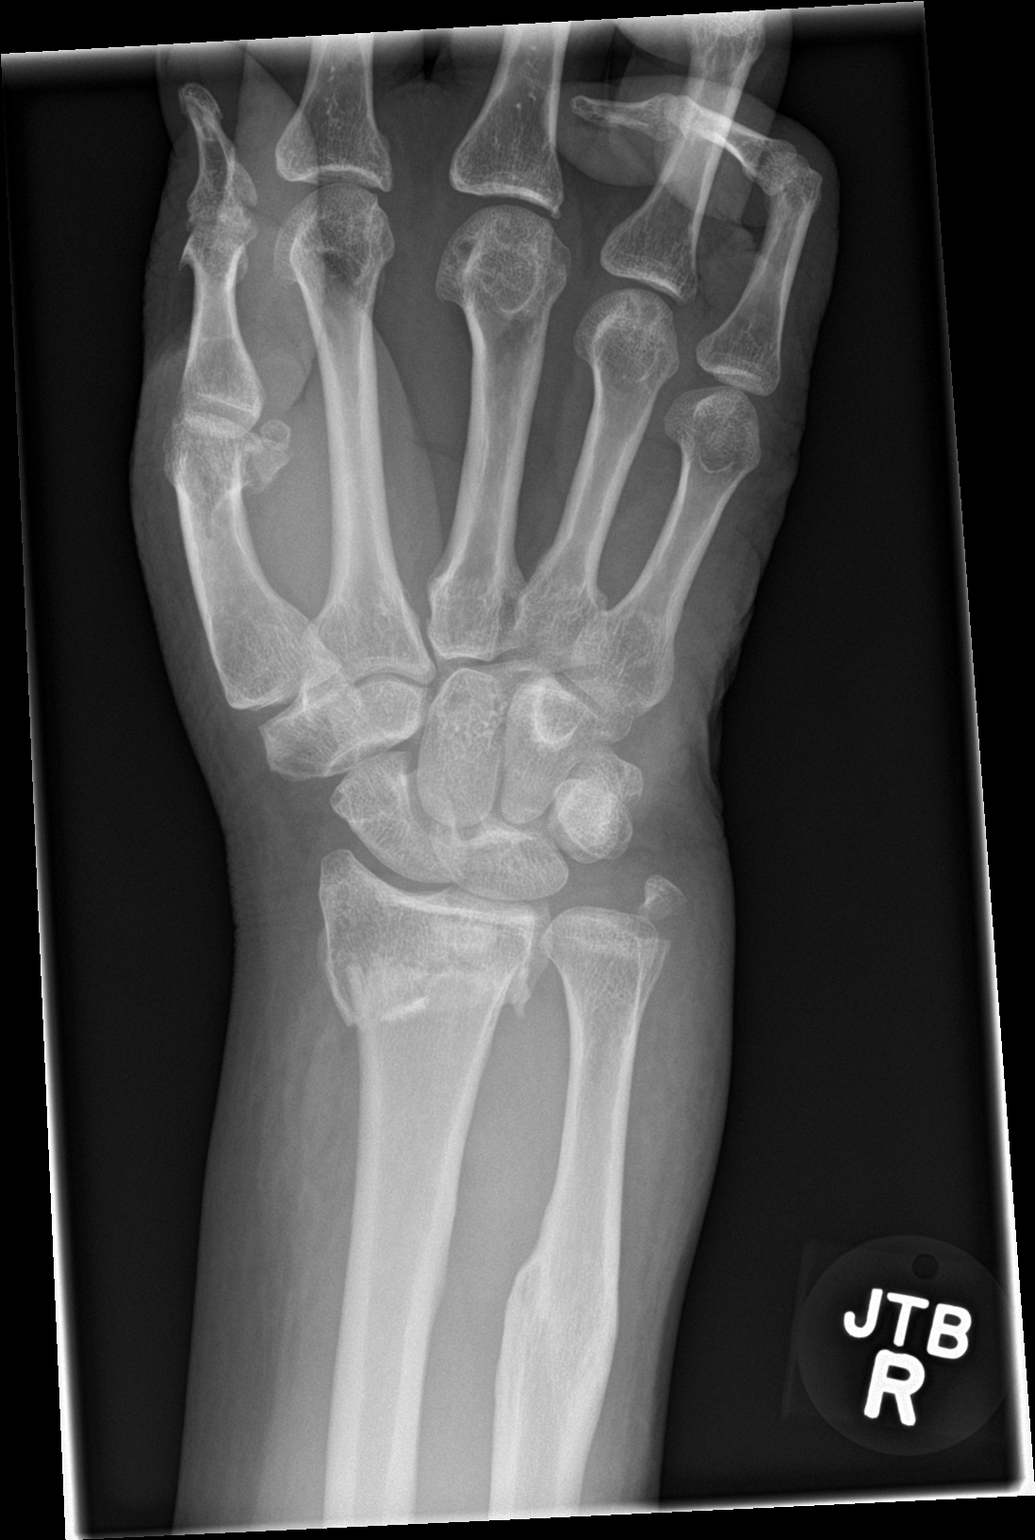

[wrist obl]
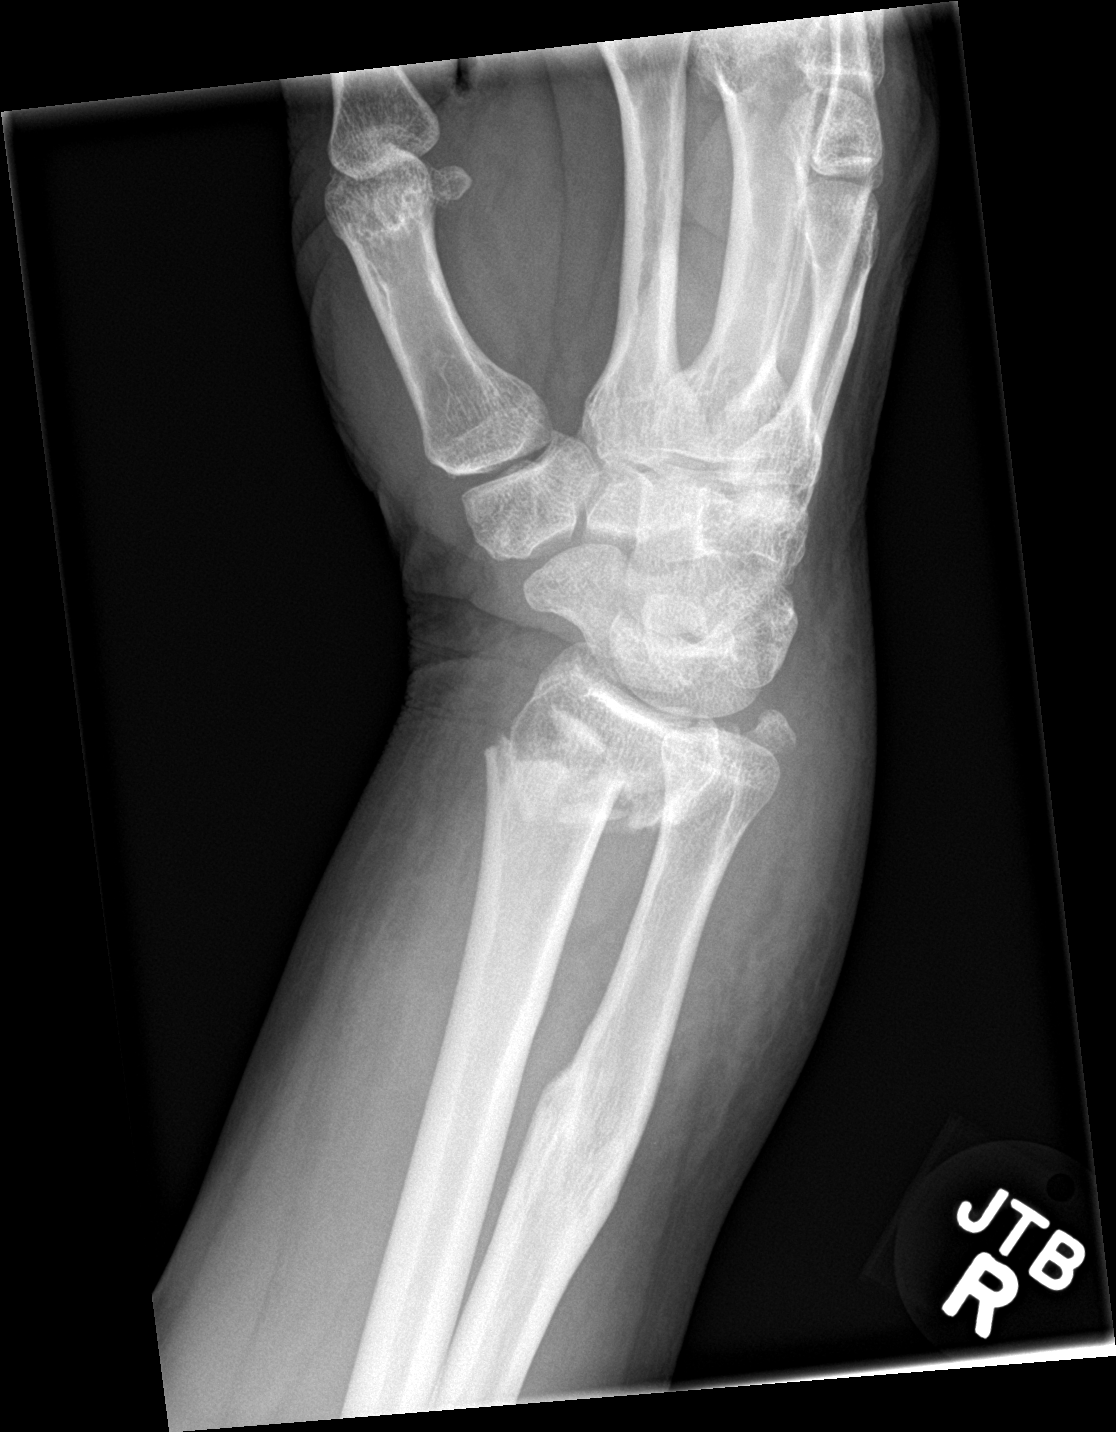

[wrist lat]
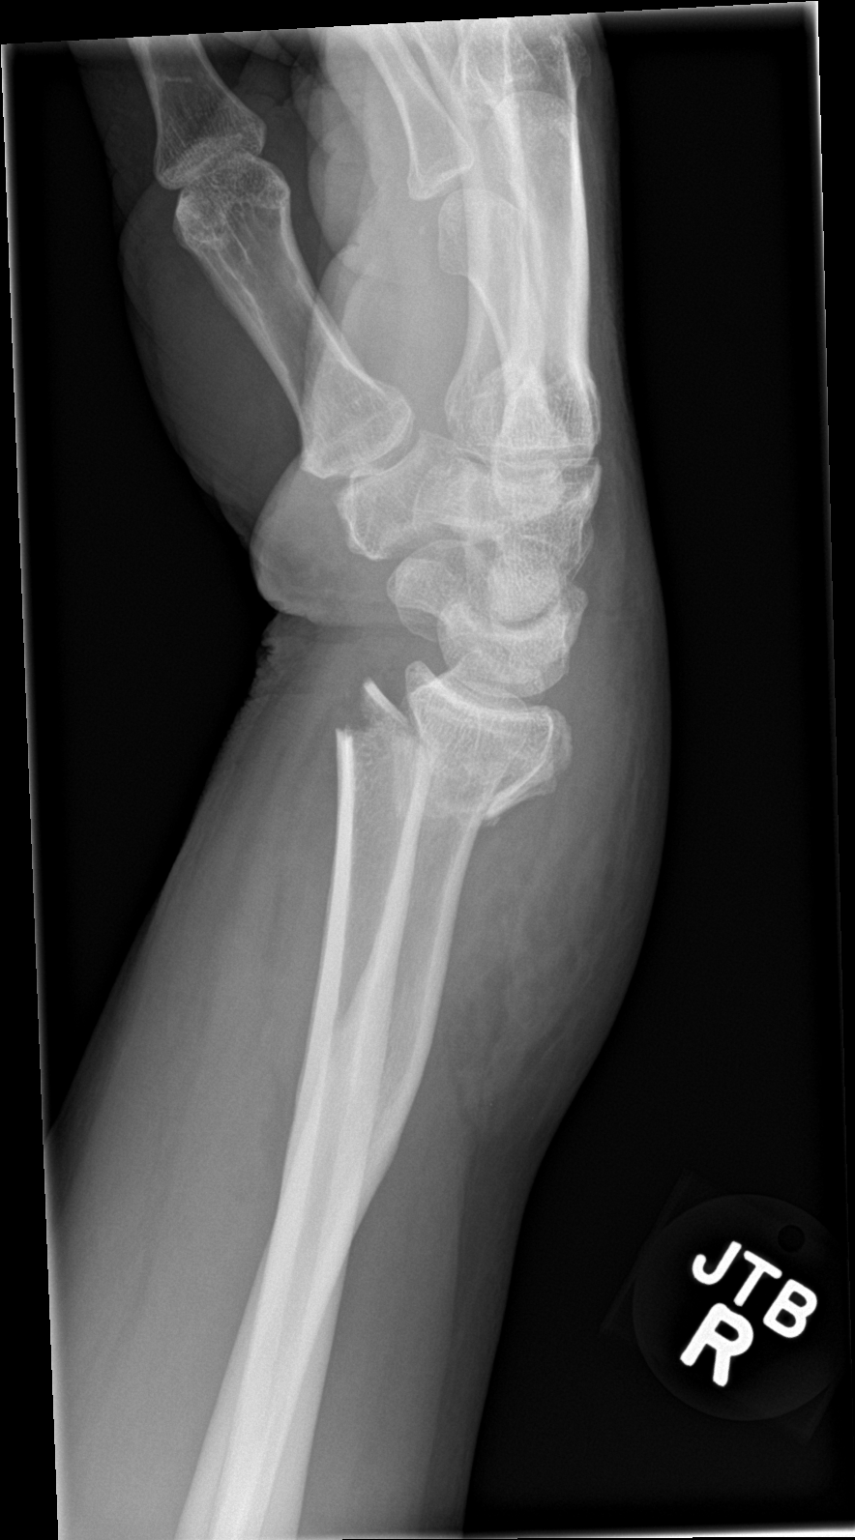

[wrist navicular]
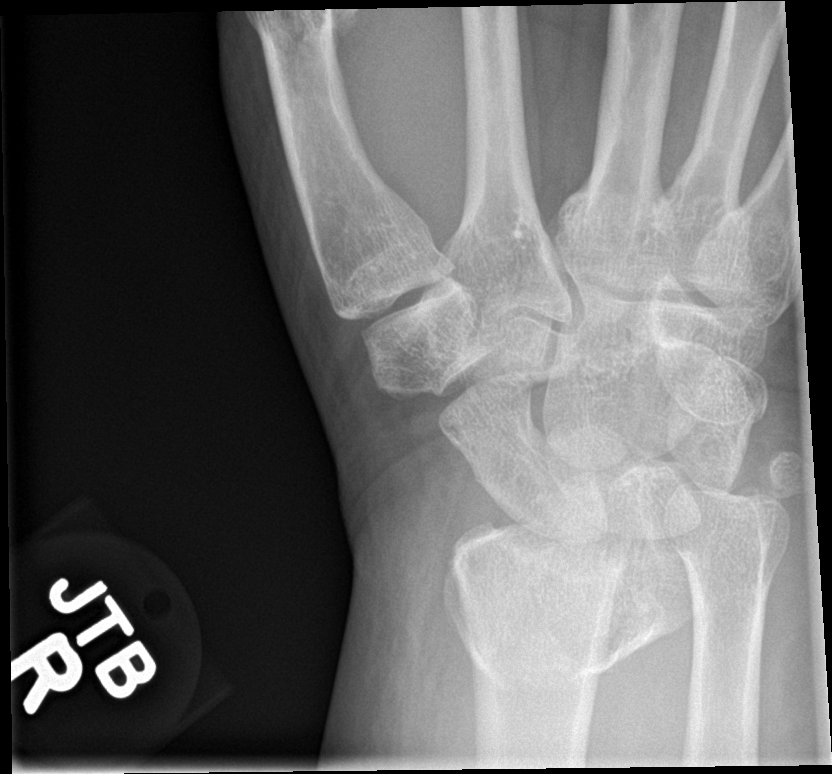

[4 of 4 positions shown; findings below may reference images not displayed]

FINDINGS: There is a transverse comminuted mildly impacted fracture of the
distal radial shaft . There is a [DATE] shaft posterior dislocation of
the distal fracture fragment and no significant angulation. There is
an associated avulsion fracture of the ulnar styloid. Deformity of
the distal ulna likely from a healed fracture is noted. There is an
associated soft tissue swelling and hematoma.
IMPRESSION: Comminuted mildly impacted posteriorly dislocated fracture of the
distal radial shaft.

Ulnar-styloid avulsion fracture.

## 2018-07-07 NOTE — Progress Notes (Signed)
Cardiology Office Note:    Date:  07/08/2018   ID:  Omar Sandoval, DOB 09-Dec-1955, MRN [id number]  PCP:  Eyvonne Mechanic, MD  Cardiologist:  Sinclair Grooms, MD   Referring MD: Eyvonne Mechanic, MD   Chief Complaint  Patient presents with  . Coronary Artery Disease  . Shortness of Breath    History of Present Illness:    Omar Sandoval is a 62 y.o. male with a hx of HIV positive syndrome on retroviral therapy, recent acute coronary syndrome with distal RCA stent implantation June 2018, hypertension, hyperlipidemia, premature ventricular contractions and chronic combined systolic and diastolic heart failure.   All is doing well.  He walks wherever he needs to go.  He is not having chest discomfort, orthopnea, edema, palpitations, or pain.  His myocardial infarction was in June 2018.[name]  He has not had bleeding.  He is compliant with his current medical regimen.  He has been on dual antiplatelet therapy for 18 months.  There is no claudication.  He does not smoke.  Complains of at least 3 discrete episodes over the last 6 months where he is awakened from sleep very short of breath somewhat clammy and needing to sit up for 1 to 2 minutes as it gradually resolves.  He does not know if he snores because he lives alone.  He does have some fatigue.  Past Medical History:  Diagnosis Date  . HIV (human immunodeficiency virus infection) ([place])   . Immune deficiency disorder ([place])   . Inguinal hernia    Left  . Myocardial infarction [facility])    01-2017 angioplasty with stents    Past Surgical History:  Procedure Laterality Date  . COLONOSCOPY    . CORONARY ANGIOPLASTY    . CORONARY/GRAFT ACUTE MI REVASCULARIZATION N/A 02/03/2017   Procedure: Coronary/Graft Acute MI Revascularization;  Surgeon: Belva Crome, MD;  Location: [place] CV LAB;  Service: Cardiovascular;  Laterality: N/A;  . FRACTURE SURGERY     left ankle and right foot  . HERNIA REPAIR    . INGUINAL HERNIA REPAIR Left 08/27/2017    Procedure: LAPAROSCOPIC LEFT INGUINAL HERNIA REPAIR WITH MESH;  Surgeon: Ralene Ok, MD;  Location: [place];  Service: General;  Laterality: Left;  . INSERTION OF MESH Left 08/27/2017   Procedure: INSERTION OF MESH;  Surgeon: Ralene Ok, MD;  Location: [place];  Service: General;  Laterality: Left;  . LEFT HEART CATH AND CORONARY ANGIOGRAPHY N/A 02/03/2017   Procedure: Left Heart Cath and Coronary Angiography;  Surgeon: Belva Crome, MD;  Location: [place] CV LAB;  Service: Cardiovascular;  Laterality: N/A;  . OPEN REDUCTION INTERNAL FIXATION (ORIF) DISTAL RADIAL FRACTURE Right 07/20/2016   Procedure: OPEN REDUCTION INTERNAL FIXATION (ORIF) DISTAL RADIAL FRACTURE;  Surgeon: Iran Planas, MD;  Location: [place];  Service: Orthopedics;  Laterality: Right;  . SURGERY SCROTAL / TESTICULAR     nondecended testes    Current Medications: Current Meds  Medication Sig  . aspirin 81 MG chewable tablet Chew 1 tablet (81 mg total) by mouth daily.  [name] atorvastatin (LIPITOR) 40 MG tablet TAKE 1 TABLET BY MOUTH ONCE DAILY AT  6PM.  . BRILINTA 90 MG TABS tablet TAKE 1 TABLET BY MOUTH TWICE DAILY  . Dolutegravir-lamiVUDine 50-300 MG TABS Take 50-300 mg by mouth daily.  [name] lisinopril (PRINIVIL,ZESTRIL) 2.5 MG tablet Take 1 tablet (2.5 mg total) by mouth daily.  . metoprolol succinate (TOPROL-XL) 25 MG 24 hr tablet TAKE 1/2 (ONE-HALF)  TABLET BY MOUTH TWICE DAILY  . NARCAN 4 MG/0.1ML LIQD nasal spray kit Place 1 spray into alternate nostrils as directed.  . nitroGLYCERIN (NITROSTAT) 0.4 MG SL tablet Place 1 tablet (0.4 mg total) under the tongue every 5 (five) minutes x 3 doses as needed for chest pain.  [name] oxyCODONE-acetaminophen (PERCOCET) 10-325 MG tablet Take 0.5-1 tablets by mouth every 4 (four) hours as needed for pain.  . [DISCONTINUED] atorvastatin (LIPITOR) 40 MG tablet TAKE 1 TABLET BY MOUTH ONCE DAILY AT  6PM.     Allergies:   Chocolate; Lactose intolerance (gi); and Vicodin  [hydrocodone-acetaminophen]   Social History   Socioeconomic History  . Marital status: Single    Spouse name: Not on file  . Number of children: Not on file  . Years of education: Not on file  . Highest education level: Not on file  Occupational History  . Not on file  Social Needs  . Financial resource strain: Not on file  . Food insecurity:    Worry: Not on file    Inability: Not on file  . Transportation needs:    Medical: Not on file    Non-medical: Not on file  Tobacco Use  . Smoking status: Former Smoker    Packs/day: 0.50    Years: 45.00    Pack years: 22.50    Types: Cigarettes    Last attempt to quit: 2013    Years since quitting: 6.8  . Smokeless tobacco: Never Used  Substance and Sexual Activity  . Alcohol use: Yes    Alcohol/week: 3.0 standard drinks    Types: 3 Cans of beer per week    Comment: beer daily  . Drug use: Yes    Types: Marijuana    Comment: used  Marijuana  . Sexual activity: Not on file  Lifestyle  . Physical activity:    Days per week: Not on file    Minutes per session: Not on file  . Stress: Not on file  Relationships  . Social connections:    Talks on phone: Not on file    Gets together: Not on file    Attends religious service: Not on file    Active member of club or organization: Not on file    Attends meetings of clubs or organizations: Not on file    Relationship status: Not on file  Other Topics Concern  . Not on file  Social History Narrative  . Not on file     Family History: The patient's family history includes Cancer in his brother and mother.  ROS:   Please see the history of present illness.    Occasional skipped heartbeats, wheezing, episodes of awakening from sleep short of breath, and rare dyspnea on exertion. All other systems reviewed and are negative.  EKGs/Labs/Other Studies Reviewed:    The following studies were reviewed today: New data  EKG:  EKG is  ordered today.  The ekg ordered today  demonstrates normal sinus rhythm, right bundle branch block, inferior Q waves, and in comparison to prior tracing performed 08/22/2017, no significant change has occurred.  Recent Labs: 08/22/2017: BUN 10; Creatinine, Ser 0.89; Hemoglobin 13.5; Platelets 303; Potassium 4.2; Sodium 136  Recent Lipid Panel    Component Value Date/Time   CHOL 156 02/03/2017 1323   TRIG 39 02/03/2017 1323   HDL 47 02/03/2017 1323   CHOLHDL 3.3 02/03/2017 1323   VLDL 8 02/03/2017 1323   LDLCALC 101 (H) 02/03/2017 1323    Physical  Exam:    VS:  BP 104/68   Pulse 63   Ht 5' 8" (1.727 m)   Wt 199 lb 1.9 oz (90.3 kg)   SpO2 100%   BMI 30.28 kg/m     Wt Readings from Last 3 Encounters:  07/08/18 199 lb 1.9 oz (90.3 kg)  05/15/18 200 lb (90.7 kg)  12/30/17 197 lb 12.8 oz (89.7 kg)     GEN:  Well nourished, well developed in no acute distress HEENT: Normal NECK: No JVD. LYMPHATICS: No lymphadenopathy CARDIAC: RRR, no murmur, no gallop, no edema. VASCULAR: 2+ bilateral carotid and radial pulses.  No bruits. RESPIRATORY:  Clear to auscultation without rales, wheezing or rhonchi  ABDOMEN: Soft, non-tender, non-distended, No pulsatile mass, MUSCULOSKELETAL: No deformity  SKIN: Warm and dry NEUROLOGIC:  Alert and oriented x 3 PSYCHIATRIC:  Normal affect   ASSESSMENT:    1. Coronary artery disease involving native coronary artery of native heart with angina pectoris ([place])   2. Mixed hyperlipidemia   3. Chronic combined systolic and diastolic heart failure ([place])   4. Premature ventricular contractions   5. PND (paroxysmal nocturnal dyspnea)    PLAN:    In order of problems listed above:  1. Discontinue Brilinta.  Continue aspirin.  Secondary risk prevention discussed. 2. LDL cholesterol less than 70.  Current data-year-old.  We will perform lipid panel, C met.  LDL target should be less than 70. 3. No evidence of volume overload.  On guideline directed therapy for LV preservation (ACE and  beta-blocker). 4. No new PVCs. 5. Concerned about the possibility of sleep apnea.  We will do a sleep study as this may be an discovered risk factor.  Overall education and awareness concerning primary/secondary risk prevention was discussed in detail: LDL less than 70, hemoglobin A1c less than 7, blood pressure target less than 130/80 mmHg, >150 minutes of moderate aerobic activity per week, avoidance of smoking, weight control (via diet and exercise), and continued surveillance/management of/for obstructive sleep apnea.  Month follow-up with APP P.  One year follow-up with me.   Medication Adjustments/Labs and Tests Ordered: Current medicines are reviewed at length with the patient today.  Concerns regarding medicines are outlined above.  Orders Placed This Encounter  Procedures  . Comprehensive metabolic panel  . Lipid panel  . EKG 12-Lead  . Split night study   Meds ordered this encounter  Medications  . atorvastatin (LIPITOR) 40 MG tablet    Sig: TAKE 1 TABLET BY MOUTH ONCE DAILY AT  6PM.    Dispense:  90 tablet    Refill:  2    Patient Instructions  Medication Instructions:  Your physician has recommended you make the following change in your medication:  1.) STOP BRILINTA  If you need a refill on your cardiac medications before your next appointment, please call your pharmacy.   Lab work: Today: cmet, lipids  If you have labs (blood work) drawn today and your tests are completely normal, you will receive your results only by: [name] MyChart Message (if you have MyChart) OR . A paper copy in the mail If you have any lab test that is abnormal or we need to change your treatment, we will call you to review the results.  Testing/Procedures: Your physician has recommended that you have a sleep study. This test records several body functions during sleep, including: brain activity, eye movement, oxygen and carbon dioxide blood levels, heart rate and rhythm, breathing rate and  rhythm, the flow  of air through your mouth and nose, snoring, body muscle movements, and chest and belly movement.    Follow-Up: At [facility], you and your health needs are our priority.  As part of our continuing mission to provide you with exceptional heart care, we have created designated Provider Care Teams.  These Care Teams include your primary Cardiologist (physician) and Advanced Practice Providers (APPs -  Physician Assistants and Nurse Practitioners) who all work together to provide you with the care you need, when you need it. You will need a follow up appointment in 6 months.  Please call our office 2 months in advance to schedule this appointment.  You may see Sinclair Grooms, MD or one of the following Advanced Practice Providers on your designated Care Team:   Truitt Merle, NP Cecilie Kicks, NP . Kathyrn Drown, NP PLAN ON 6 MONTHS WITH AN ADVANCED PRACTICE PROVIDER AND 12 MONTHS WITH DR. Tamala Julian Any Other Special Instructions Will Be Listed Below (If Applicable).       Signed, Sinclair Grooms, MD  07/08/2018 8:57 AM    [place]

## 2018-07-08 ENCOUNTER — Ambulatory Visit (INDEPENDENT_AMBULATORY_CARE_PROVIDER_SITE_OTHER): Payer: Medicare HMO | Admitting: Interventional Cardiology

## 2018-07-08 ENCOUNTER — Encounter: Payer: Self-pay | Admitting: Interventional Cardiology

## 2018-07-08 VITALS — BP 104/68 | HR 63 | Ht 68.0 in | Wt 199.1 lb

## 2018-07-08 DIAGNOSIS — I25119 Atherosclerotic heart disease of native coronary artery with unspecified angina pectoris: Secondary | ICD-10-CM | POA: Diagnosis not present

## 2018-07-08 DIAGNOSIS — I493 Ventricular premature depolarization: Secondary | ICD-10-CM

## 2018-07-08 DIAGNOSIS — E782 Mixed hyperlipidemia: Secondary | ICD-10-CM | POA: Diagnosis not present

## 2018-07-08 DIAGNOSIS — I5042 Chronic combined systolic (congestive) and diastolic (congestive) heart failure: Secondary | ICD-10-CM

## 2018-07-08 DIAGNOSIS — R0600 Dyspnea, unspecified: Secondary | ICD-10-CM

## 2018-07-08 LAB — COMPREHENSIVE METABOLIC PANEL
A/G RATIO: 1.8 (ref 1.2–2.2)
ALT: 36 IU/L (ref 0–44)
AST: 31 IU/L (ref 0–40)
Albumin: 4.1 g/dL (ref 3.6–4.8)
Alkaline Phosphatase: 78 IU/L (ref 39–117)
BUN/Creatinine Ratio: 12 (ref 10–24)
BUN: 9 mg/dL (ref 8–27)
Bilirubin Total: 0.4 mg/dL (ref 0.0–1.2)
CALCIUM: 9.2 mg/dL (ref 8.6–10.2)
CO2: 24 mmol/L (ref 20–29)
Chloride: 103 mmol/L (ref 96–106)
Creatinine, Ser: 0.75 mg/dL — ABNORMAL LOW (ref 0.76–1.27)
GFR, EST AFRICAN AMERICAN: 114 mL/min/{1.73_m2} (ref >59)
GFR, EST NON AFRICAN AMERICAN: 99 mL/min/{1.73_m2} (ref >59)
GLOBULIN, TOTAL: 2.3 g/dL (ref 1.5–4.5)
Glucose: 99 mg/dL (ref 65–99)
POTASSIUM: 4.4 mmol/L (ref 3.5–5.2)
Sodium: 140 mmol/L (ref 134–144)
Total Protein: 6.4 g/dL (ref 6.0–8.5)

## 2018-07-08 LAB — LIPID PANEL
CHOL/HDL RATIO: 2.7 ratio (ref 0.0–5.0)
Cholesterol, Total: 126 mg/dL (ref 100–199)
HDL: 46 mg/dL (ref >39)
LDL Calculated: 71 mg/dL (ref 0–99)
Triglycerides: 43 mg/dL (ref 0–149)
VLDL Cholesterol Cal: 9 mg/dL (ref 5–40)

## 2018-07-08 MED ORDER — ATORVASTATIN CALCIUM 40 MG PO TABS: ORAL_TABLET | ORAL | 2 refills | Status: DC

## 2018-07-08 NOTE — Patient Instructions (Signed)
Medication Instructions:  Your physician has recommended you make the following change in your medication:  1.) STOP BRILINTA  If you need a refill on your cardiac medications before your next appointment, please call your pharmacy.   Lab work: Today: cmet, lipids  If you have labs (blood work) drawn today and your tests are completely normal, you will receive your results only by: [name]. MyChart Message (if you have MyChart) OR . A paper copy in the mail If you have any lab test that is abnormal or we need to change your treatment, we will call you to review the results.  Testing/Procedures: Your physician has recommended that you have a sleep study. This test records several body functions during sleep, including: brain activity, eye movement, oxygen and carbon dioxide blood levels, heart rate and rhythm, breathing rate and rhythm, the flow of air through your mouth and nose, snoring, body muscle movements, and chest and belly movement.    Follow-Up: At [facility], you and your health needs are our priority.  As part of our continuing mission to provide you with exceptional heart care, we have created designated Provider Care Teams.  These Care Teams include your primary Cardiologist (physician) and Advanced Practice Providers (APPs -  Physician Assistants and Nurse Practitioners) who all work together to provide you with the care you need, when you need it. You will need a follow up appointment in 6 months.  Please call our office 2 months in advance to schedule this appointment.  You may see Lesleigh NoeHenry W Smith III, MD or one of the following Advanced Practice Providers on your designated Care Team:   Norma FredricksonLori Gerhardt, NP Nada BoozerLaura Ingold, NP . Georgie ChardJill McDaniel, NP PLAN ON 6 MONTHS WITH AN ADVANCED PRACTICE PROVIDER AND 12 MONTHS WITH DR. Katrinka BlazingSMITH Any Other Special Instructions Will Be Listed Below (If Applicable).

## 2018-08-17 ENCOUNTER — Telehealth: Payer: Self-pay | Admitting: *Deleted

## 2018-08-17 NOTE — Telephone Encounter (Signed)
Sent to precert 

## 2018-08-17 NOTE — Telephone Encounter (Signed)
-----   Message from Julio SicksJennifer L Bowers, LPN sent at 95/28/413212/23/2019  7:46 AM EST ----- Carney CornersHey Nina,  Just wanted to check in and see where we are with Mr. [name] sleep study ordered on 11/13?  Thanks

## 2018-09-09 ENCOUNTER — Telehealth: Payer: Self-pay | Admitting: *Deleted
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
## 2018-09-09 NOTE — Telephone Encounter (Signed)
[facility] lab [facility]. Clinicals faxed to [phone].

## 2018-09-15 ENCOUNTER — Telehealth: Payer: Self-pay | Admitting: *Deleted
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
## 2018-09-15 NOTE — Telephone Encounter (Signed)
Staff message sent to [facility] Auth received. Ok to schedule sleep study. Auth # [id number]. Valid dates 09/26/18 to 10/26/18.

## 2018-09-17 ENCOUNTER — Telehealth: Payer: Self-pay | Admitting: Cardiology

## 2018-09-17 NOTE — Telephone Encounter (Signed)
Pt c/o of Chest Pain: STAT if CP now or developed within 24 hours  1. Are you having CP right now? no  2. Are you experiencing any other symptoms (ex. SOB, nausea, vomiting, sweating)? Bodyaches, chest tightness  3. How long have you been experiencing CP? For sometime, patient has been under a lot of stress.  4. Is your CP continuous or coming and going? Comes and goes  5. Have you taken Nitroglycerin? No  Patient scheduled with Robbie Lis, PA on 1/29 ?

## 2018-09-17 NOTE — Telephone Encounter (Signed)
Spoke with pt and he states that for a few days he had noticed chest tightness that comes and goes throughout the day.  States it also feels like a burning sensation.  Denies lightheadedness, dizziness or SOB.  Pt not currently have CP.  Does confess to increased stress lately related to possible eviction from apt because of his dog being a pit bull.  Moved appt up to tomorrow at [facility] with Azalee Course.  Pt verbalized understanding and was in agreement with this plan.

## 2018-09-18 ENCOUNTER — Ambulatory Visit (INDEPENDENT_AMBULATORY_CARE_PROVIDER_SITE_OTHER): Payer: Medicare HMO | Admitting: Physician Assistant

## 2018-09-18 VITALS — BP 92/62 | HR 82 | Ht 69.0 in | Wt 204.6 lb

## 2018-09-18 DIAGNOSIS — R079 Chest pain, unspecified: Secondary | ICD-10-CM

## 2018-09-18 DIAGNOSIS — Z79899 Other long term (current) drug therapy: Secondary | ICD-10-CM

## 2018-09-18 DIAGNOSIS — I5042 Chronic combined systolic (congestive) and diastolic (congestive) heart failure: Secondary | ICD-10-CM

## 2018-09-18 DIAGNOSIS — E785 Hyperlipidemia, unspecified: Secondary | ICD-10-CM | POA: Diagnosis not present

## 2018-09-18 DIAGNOSIS — I10 Essential (primary) hypertension: Secondary | ICD-10-CM

## 2018-09-18 DIAGNOSIS — I2510 Atherosclerotic heart disease of native coronary artery without angina pectoris: Secondary | ICD-10-CM

## 2018-09-18 DIAGNOSIS — B20 Human immunodeficiency virus [HIV] disease: Secondary | ICD-10-CM

## 2018-09-18 NOTE — Progress Notes (Signed)
Cardiology Office Note    Date:  09/20/2018   ID:  Omar Sandoval, DOB [birth year], MRN [id number]  PCP:  Eyvonne Mechanic, MD  Cardiologist:  Dr. Tamala Julian   Chief Complaint  Patient presents with  . Follow-up    C/O INCREASED STRESS    History of Present Illness:  Omar Sandoval is a 63 y.o. male with PMH of HIV, HTN, HLD, PVC, chronic combined systolic and diastolic heart failure and CAD.  He had DES to distal RCA in June 2018 for inferior STEMI, he also had 70% proximal to mid LAD residual and 60 to 70% distal left circumflex residual, EF was 40 to 50%.  Echocardiogram obtained after the procedure on 02/06/2017 showed EF 45 to 50%, septal mid and basal inferior wall hypokinesis with mild LVH, mild MR. Patient was last seen by Dr. Tamala Julian on 07/08/2018 who recommended a outpatient sleep study.  His Brilinta was stopped at that time.  Patient contacted cardiology yesterday complaining of intermittent chest pain for the past several days.  For the past week, patient has been having intermittent burning sensation in the left side of the chest.  This is different from his previous angina which he describes as a sharp stabbing sensation in the same area.  The chest pain only lasted from a few seconds up to a minute.  He says this occurs mainly when his landlord is trying to chase him out.  He is facing possible eviction from his apartment as his landlord does not like his pitbull terrier.  He says he has a lot of anxiety from this.  He does not have any lower extremity edema, orthopnea or PND.  On initial arrival today, his blood pressure was 92/62.  However on repeat manual check by myself, right arm blood pressure was 88/54, left arm manual blood pressure was 86/56.  I am quite concerned about his new hypotension, however his discomfort is quite atypical, I plan to discontinue his low-dose lisinopril while continuing his low-dose metoprolol.  I will arrange for of fairly urgent Lexiscan stress test next Monday.   I plan to see the patient back in 1 week for reassessment.  EKG today is unchanged from the previous EKG.   Past Medical History:  Diagnosis Date  . HIV (human immunodeficiency virus infection) ([place])   . Immune deficiency disorder ([place])   . Inguinal hernia    Left  . Myocardial infarction [facility])    01-2017 angioplasty with stents    Past Surgical History:  Procedure Laterality Date  . COLONOSCOPY    . CORONARY ANGIOPLASTY    . CORONARY/GRAFT ACUTE MI REVASCULARIZATION N/A 02/03/2017   Procedure: Coronary/Graft Acute MI Revascularization;  Surgeon: Belva Crome, MD;  Location: [place] [facility];  Service: Cardiovascular;  Laterality: N/A;  . FRACTURE SURGERY     left ankle and right foot  . HERNIA REPAIR    . INGUINAL HERNIA REPAIR Left 08/27/2017   Procedure: LAPAROSCOPIC LEFT INGUINAL HERNIA REPAIR WITH MESH;  Surgeon: Ralene Ok, MD;  Location: [place];  Service: General;  Laterality: Left;  . INSERTION OF MESH Left 08/27/2017   Procedure: INSERTION OF MESH;  Surgeon: Ralene Ok, MD;  Location: [place];  Service: General;  Laterality: Left;  . LEFT HEART CATH AND CORONARY ANGIOGRAPHY N/A 02/03/2017   Procedure: Left Heart Cath and Coronary Angiography;  Surgeon: Belva Crome, MD;  Location: [place] [facility];  Service: Cardiovascular;  Laterality: N/A;  . OPEN REDUCTION  INTERNAL FIXATION (ORIF) DISTAL RADIAL FRACTURE Right 07/20/2016   Procedure: OPEN REDUCTION INTERNAL FIXATION (ORIF) DISTAL RADIAL FRACTURE;  Surgeon: Iran Planas, MD;  Location: [place];  Service: Orthopedics;  Laterality: Right;  . SURGERY SCROTAL / TESTICULAR     nondecended testes    Current Medications: Outpatient Medications Prior to Visit  Medication Sig Dispense Refill  . aspirin 81 MG chewable tablet Chew 1 tablet (81 mg total) by mouth daily.    [name] atorvastatin (LIPITOR) 40 MG tablet TAKE 1 TABLET BY MOUTH ONCE DAILY AT  6PM. 90 tablet 2  . BRILINTA 90 MG TABS tablet TAKE 1 TABLET BY MOUTH  TWICE DAILY 180 tablet 2  . Dolutegravir-lamiVUDine 50-300 MG TABS Take 50-300 mg by mouth daily.    . metoprolol succinate (TOPROL-XL) 25 MG 24 hr tablet TAKE 1/2 (ONE-HALF) TABLET BY MOUTH TWICE DAILY 90 tablet 2  . NARCAN 4 MG/0.1ML LIQD nasal spray kit Place 1 spray into alternate nostrils as directed.  0  . nitroGLYCERIN (NITROSTAT) 0.4 MG SL tablet Place 1 tablet (0.4 mg total) under the tongue every 5 (five) minutes x 3 doses as needed for chest pain. 25 tablet 6  . oxyCODONE-acetaminophen (PERCOCET) 10-325 MG tablet Take 0.5-1 tablets by mouth every 4 (four) hours as needed for pain.    [name] lisinopril (PRINIVIL,ZESTRIL) 2.5 MG tablet Take 1 tablet (2.5 mg total) by mouth daily. 90 tablet 3   No facility-administered medications prior to visit.      Allergies:   Chocolate; Lactose intolerance (gi); and Vicodin [hydrocodone-acetaminophen]   Social History   Socioeconomic History  . Marital status: Single    Spouse name: Not on file  . Number of children: Not on file  . Years of education: Not on file  . Highest education level: Not on file  Occupational History  . Not on file  Social Needs  . Financial resource strain: Not on file  . Food insecurity:    Worry: Not on file    Inability: Not on file  . Transportation needs:    Medical: Not on file    Non-medical: Not on file  Tobacco Use  . Smoking status: Former Smoker    Packs/day: 0.50    Years: 45.00    Pack years: 22.50    Types: Cigarettes    Last attempt to quit: 2013    Years since quitting: 7.0  . Smokeless tobacco: Never Used  Substance and Sexual Activity  . Alcohol use: Yes    Alcohol/week: 3.0 standard drinks    Types: 3 Cans of beer per week    Comment: beer daily  . Drug use: Yes    Types: Marijuana    Comment: used  Marijuana  . Sexual activity: Not on file  Lifestyle  . Physical activity:    Days per week: Not on file    Minutes per session: Not on file  . Stress: Not on file  Relationships  .  Social connections:    Talks on phone: Not on file    Gets together: Not on file    Attends religious service: Not on file    Active member of club or organization: Not on file    Attends meetings of clubs or organizations: Not on file    Relationship status: Not on file  Other Topics Concern  . Not on file  Social History Narrative  . Not on file     Family History:  The patient's family history includes  Cancer in his brother and mother.   ROS:   Please see the history of present illness.    ROS All other systems reviewed and are negative.   PHYSICAL EXAM:   VS:  BP 92/62 (BP Location: Left Arm, Patient Position: Sitting)   Pulse 82   Ht '5\' 9"'$  (1.753 m)   Wt 204 lb 9.6 oz (92.8 kg)   BMI 30.21 kg/m    GEN: Well nourished, well developed, in no acute distress  HEENT: normal  Neck: no JVD, carotid bruits, or masses Cardiac: RRR; no murmurs, rubs, or gallops,no edema  Respiratory:  clear to auscultation bilaterally, normal work of breathing GI: soft, nontender, nondistended, + BS MS: no deformity or atrophy  Skin: warm and dry, no rash Neuro:  Alert and Oriented x 3, Strength and sensation are intact Psych: euthymic mood, full affect  Wt Readings from Last 3 Encounters:  09/18/18 204 lb 9.6 oz (92.8 kg)  07/08/18 199 lb 1.9 oz (90.3 kg)  05/15/18 200 lb (90.7 kg)      Studies/Labs Reviewed:   EKG:  EKG is ordered today.  The ekg ordered today demonstrates normal sinus rhythm, right bundle branch block.  T wave inversions in inferior leads.  Recent Labs: 09/18/2018: ALT 20; BUN 8; Creatinine, Ser 0.88; Potassium 4.4; Sodium 144; TSH 0.995   Lipid Panel    Component Value Date/Time   CHOL 126 07/08/2018 0901   TRIG 43 07/08/2018 0901   HDL 46 07/08/2018 0901   CHOLHDL 2.7 07/08/2018 0901   CHOLHDL 3.3 02/03/2017 1323   VLDL 8 02/03/2017 1323   LDLCALC 71 07/08/2018 0901    Additional studies/ records that were reviewed today include:   Cath  02/03/2017  Acute inferior wall ST elevation MI, late presenting (greater than 12 hours) with ongoing pain.  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  Successful PCI and stenting of the mid RCA from 100% obstruction to less than 15% with TIMI grade 3 flow using a 16 x 3.5 mm Synergy DES. Postdilatation final diameter 4.0 mm at high pressure.  70% proximal to mid LAD.  60-70% distal circumflex.  Inferior wall severe hypokinesis. LVEF 40-50%. Elevated LVEDP. Findings are compatible with acute combined diastolic and systolic heart failure.  RECOMMENDATIONS:   Dual antiplatelet therapy, aspirin and Brilinta.  Low-dose beta blocker therapy, high intensity statin therapy, and additional therapy is required to control blood pressure.  IV nitroglycerin for 12-24 hours to help with blood pressure and also dilate the microcirculation beyond the stent.  Pharmacy to help [place] avoid interaction with HIV therapy.  Probable outpatient myocardial perfusion study to exclude LAD territory ischemia.   Echo 02/06/2017 LV EF: 45% -   50% Study Conclusions  - Left ventricle: Septal mid and basal inferior wall hypokinesis.   The cavity size was normal. Wall thickness was increased in a   pattern of mild LVH. Systolic function was mildly reduced. The   estimated ejection fraction was in the range of 45% to 50%.   Doppler parameters are consistent with both elevated ventricular   end-diastolic filling pressure and elevated left atrial filling   pressure. - Mitral valve: There was mild regurgitation. - Atrial septum: No defect or patent foramen ovale was identified.   ASSESSMENT:    1. Chest pain, unspecified type   2. Medication management   3. Essential hypertension   4. Hyperlipidemia, unspecified hyperlipidemia type   5. Chronic combined systolic and diastolic heart failure (  [place])   6. HIV infection, unspecified symptom status ([place])   7. Coronary artery  disease involving native coronary artery of native heart without angina pectoris      PLAN:  In order of problems listed above:  1. Chest pain: Although patient says his chest pain mainly occurs due to emotional stress as he is facing eviction from his apartment, I am quite concerned about his hypotension recently.  His EKG is unchanged.  I reviewed his EKG with DOD Dr. Sallyanne Kuster.  I plan to obtain a fairly urgent stress test and see him back in a week  2. CAD: Continue aspirin and Brilinta.  See #1  3. Hypertension: Patient is actually hypotensive today.  I will discontinue his lisinopril and continue him on low-dose beta-blocker.  Both of his lisinopril and a beta-blocker dosage was low even prior to today's visit.  4. Hyperlipidemia: Continue Lipitor 40 mg daily  5. History of HIV infection: Followed by infectious disease  6. Chronic combined systolic and diastolic heart failure: Euvolemic on physical exam.  Not on any diuretic at home.    Medication Adjustments/Labs and Tests Ordered: Current medicines are reviewed at length with the patient today.  Concerns regarding medicines are outlined above.  Medication changes, Labs and Tests ordered today are listed in the Patient Instructions below. Patient Instructions  Medication Instructions:  STOP LISINOPRIL  If you need a refill on your cardiac medications before your next appointment, please call your pharmacy.  Labwork: CBC, CMET AND TSH HERE IN OUR OFFICE AT [facility]   Take the provided lab slips with you to the lab for your blood draw.   When you have your labs (blood work) drawn today and your tests are completely normal, you will receive your results only by MyChart Message (if you have MyChart) -OR-  A paper copy in the mail.  If you have any lab test that is abnormal or we need to change your treatment, we will call you to review these results.  Testing/Procedures: Your physician has requested that you have a [place]. A cardiac stress test is a cardiological test that measures the heart's ability to respond to external stress in a controlled clinical environment. The stress response is induced by intravenous pharmacological stimulation. Arrive 7:45; no food 6 hrs prior; water only.  NO caffeine or decaff 24 hrs prior.  he will go past admitting to elevators at end of hall and go downstairs to radiology.  Follow-Up: You will need a follow up appointment in 1 weeks-WITH Elmo Shumard, PA-C.  You may see Sinclair Grooms, MD or one of the following Advanced Practice Providers on your designated Care Team:  Truitt Merle, NP  Cecilie Kicks, NP  Kathyrn Drown, NP  At [facility], you and your health needs are our priority.  As part of our continuing mission to provide you with exceptional heart care, we have created designated Provider Care Teams.  These Care Teams include your primary Cardiologist (physician) and Advanced Practice Providers (APPs -  Physician Assistants and Nurse Practitioners) who all work together to provide you with the care you need, when you need it.  Thank you for choosing [facility] at [facility], [place]  09/20/2018 11:54 PM    [facility] [place], [employer], [place]  [address] Phone: [phone]; Fax: [phone]

## 2018-09-18 NOTE — Patient Instructions (Signed)
Medication Instructions:  STOP LISINOPRIL  If you need a refill on your cardiac medications before your next appointment, please call your pharmacy.  Labwork: CBC, CMET AND TSH HERE IN OUR OFFICE AT [facility]   Take the provided lab slips with you to the lab for your blood draw.   When you have your labs (blood work) drawn today and your tests are completely normal, you will receive your results only by MyChart Message (if you have MyChart) -OR-  A paper copy in the mail.  If you have any lab test that is abnormal or we need to change your treatment, we will call you to review these results.  Testing/Procedures: Your physician has requested that you have a lexiscan myoview-AT [facility]. A cardiac stress test is a cardiological test that measures the heart's ability to respond to external stress in a controlled clinical environment. The stress response is induced by intravenous pharmacological stimulation. Arrive 7:45; no food 6 hrs prior; water only.  NO caffeine or decaff 24 hrs prior.  he will go past admitting to elevators at end of hall and go downstairs to radiology.  Follow-Up: You will need a follow up appointment in 1 weeks-WITH HAO MENG, PA-C.  You may see Lesleigh Noe, MD or one of the following Advanced Practice Providers on your designated Care Team:  Norma Fredrickson, NP  Nada Boozer, NP  Georgie Chard, NP  At [facility], you and your health needs are our priority.  As part of our continuing mission to provide you with exceptional heart care, we have created designated Provider Care Teams.  These Care Teams include your primary Cardiologist (physician) and Advanced Practice Providers (APPs -  Physician Assistants and Nurse Practitioners) who all work together to provide you with the care you need, when you need it.  Thank you for choosing [facility] at [facility]!!

## 2018-09-19 LAB — COMPREHENSIVE METABOLIC PANEL
ALT: 20 IU/L (ref 0–44)
AST: 23 IU/L (ref 0–40)
Albumin/Globulin Ratio: 2 (ref 1.2–2.2)
Albumin: 4.3 g/dL (ref 3.8–4.8)
Alkaline Phosphatase: 77 IU/L (ref 39–117)
BUN/Creatinine Ratio: 9 — ABNORMAL LOW (ref 10–24)
BUN: 8 mg/dL (ref 8–27)
Bilirubin Total: 0.5 mg/dL (ref 0.0–1.2)
CALCIUM: 9.4 mg/dL (ref 8.6–10.2)
CO2: 27 mmol/L (ref 20–29)
CREATININE: 0.88 mg/dL (ref 0.76–1.27)
Chloride: 102 mmol/L (ref 96–106)
GFR, EST AFRICAN AMERICAN: 107 mL/min/{1.73_m2} (ref >59)
GFR, EST NON AFRICAN AMERICAN: 93 mL/min/{1.73_m2} (ref >59)
Globulin, Total: 2.1 g/dL (ref 1.5–4.5)
Glucose: 68 mg/dL (ref 65–99)
Potassium: 4.4 mmol/L (ref 3.5–5.2)
Sodium: 144 mmol/L (ref 134–144)
TOTAL PROTEIN: 6.4 g/dL (ref 6.0–8.5)

## 2018-09-19 LAB — TSH: TSH: 0.995 u[IU]/mL (ref 0.450–4.500)

## 2018-09-20 ENCOUNTER — Encounter: Payer: Self-pay | Admitting: Physician Assistant

## 2018-09-21 ENCOUNTER — Ambulatory Visit (HOSPITAL_COMMUNITY)
Admission: RE | Admit: 2018-09-21 | Discharge: 2018-09-21 | Disposition: A | Discharge status: Home / Self Care | Payer: Medicare HMO | Source: Ambulatory Visit | Attending: Physician Assistant | Admitting: Physician Assistant

## 2018-09-21 ENCOUNTER — Telehealth: Payer: Self-pay | Admitting: *Deleted

## 2018-09-21 DIAGNOSIS — R079 Chest pain, unspecified: Secondary | ICD-10-CM | POA: Diagnosis present

## 2018-09-21 LAB — NM MYOCAR MULTI W/SPECT W/WALL MOTION / EF
CHL CUP MPHR: 159 {beats}/min
CHL CUP RESTING HR STRESS: 61 {beats}/min
CSEPEW: 1 METS
Exercise duration (min): 5 min
Peak HR: 93 {beats}/min
Percent HR: 58 %

## 2018-09-21 MED ORDER — TECHNETIUM TC 99M TETROFOSMIN IV KIT
10.0000 | PACK | Freq: Once | INTRAVENOUS | Status: AC | PRN
  Administered 2018-09-21: 10 via INTRAVENOUS

## 2018-09-21 MED ORDER — REGADENOSON 0.4 MG/5ML IV SOLN
0.4000 mg | Freq: Once | INTRAVENOUS | Status: AC
  Administered 2018-09-21: 0.4 mg via INTRAVENOUS

## 2018-09-21 MED ORDER — REGADENOSON 0.4 MG/5ML IV SOLN
INTRAVENOUS | Status: AC
  Filled 2018-09-21: qty 5

## 2018-09-21 MED ORDER — TECHNETIUM TC 99M TETROFOSMIN IV KIT
30.0000 | PACK | Freq: Once | INTRAVENOUS | Status: AC | PRN
  Administered 2018-09-21: 30 via INTRAVENOUS

## 2018-09-21 NOTE — Progress Notes (Signed)
I need to see him earlier

## 2018-09-21 NOTE — Telephone Encounter (Signed)
-----   Message from Gaynelle Cage, CMA sent at 09/15/2018  9:12 AM EST ----- Regarding: RE: [facility] Auth received. Ok to schedule sleep study. Auth # [id number]. Valid dates 09/26/18 to 10/26/18. ----- Message ----- From: Reesa Chew, CMA Sent: 09/08/2018   9:08 AM EST To: Cv Div Sleep Studies Subject: PRECERT                                        SPLIT NIGHT

## 2018-09-21 NOTE — Progress Notes (Signed)
Lexiscan portion of test complete. Patient in no distress. 

## 2018-09-21 NOTE — Telephone Encounter (Signed)
Patient is scheduled for lab study on 10/25/18. Patient understands his sleep study will be done at [facility] sleep lab. Patient understands he will receive a sleep packet in a week or so. Patient understands to call if he does not receive the sleep packet in a timely manner. Patient agrees with treatment and thanked me for call.

## 2018-09-22 ENCOUNTER — Other Ambulatory Visit: Payer: Self-pay | Admitting: Physician Assistant

## 2018-09-22 ENCOUNTER — Encounter: Payer: Self-pay | Admitting: Physician Assistant

## 2018-09-22 ENCOUNTER — Ambulatory Visit (INDEPENDENT_AMBULATORY_CARE_PROVIDER_SITE_OTHER): Payer: Medicare HMO | Admitting: Physician Assistant

## 2018-09-22 VITALS — BP 136/80 | HR 75 | Ht 69.0 in | Wt 209.0 lb

## 2018-09-22 DIAGNOSIS — I5042 Chronic combined systolic (congestive) and diastolic (congestive) heart failure: Secondary | ICD-10-CM

## 2018-09-22 DIAGNOSIS — Z01818 Encounter for other preprocedural examination: Secondary | ICD-10-CM | POA: Diagnosis not present

## 2018-09-22 DIAGNOSIS — Z79899 Other long term (current) drug therapy: Secondary | ICD-10-CM | POA: Diagnosis not present

## 2018-09-22 DIAGNOSIS — B20 Human immunodeficiency virus [HIV] disease: Secondary | ICD-10-CM | POA: Diagnosis not present

## 2018-09-22 DIAGNOSIS — I10 Essential (primary) hypertension: Secondary | ICD-10-CM

## 2018-09-22 DIAGNOSIS — E785 Hyperlipidemia, unspecified: Secondary | ICD-10-CM

## 2018-09-22 DIAGNOSIS — R9439 Abnormal result of other cardiovascular function study: Secondary | ICD-10-CM | POA: Diagnosis not present

## 2018-09-22 LAB — CBC WITH DIFFERENTIAL/PLATELET
Absolute Monocytes: 495 cells/uL (ref 200–950)
BASOS PCT: 0.5 %
Basophils Absolute: 20 cells/uL (ref 0–200)
EOS PCT: 1.5 %
Eosinophils Absolute: 59 cells/uL (ref 15–500)
HCT: 38.3 % — ABNORMAL LOW (ref 38.5–50.0)
Hemoglobin: 13.2 g/dL (ref 13.2–17.1)
Lymphs Abs: 1763 cells/uL (ref 850–3900)
MCH: 35.1 pg — ABNORMAL HIGH (ref 27.0–33.0)
MCHC: 34.5 g/dL (ref 32.0–36.0)
MCV: 101.9 fL — ABNORMAL HIGH (ref 80.0–100.0)
MPV: 10.7 fL (ref 7.5–12.5)
Monocytes Relative: 12.7 %
Neutro Abs: 1564 cells/uL (ref 1500–7800)
Neutrophils Relative %: 40.1 %
Platelets: 302 10*3/uL (ref 140–400)
RBC: 3.76 10*6/uL — ABNORMAL LOW (ref 4.20–5.80)
RDW: 12.5 % (ref 11.0–15.0)
TOTAL LYMPHOCYTE: 45.2 %
WBC: 3.9 10*3/uL (ref 3.8–10.8)

## 2018-09-22 NOTE — Patient Instructions (Addendum)
Medication Instructions:  Your physician recommends that you continue on your current medications as directed. Please refer to the Current Medication list given to you today.  If you need a refill on your cardiac medications before your next appointment, please call your pharmacy.   Lab work: TODAY:  CBC  If you have labs (blood work) drawn today and your tests are completely normal, you will receive your results only by: [name] MyChart Message (if you have MyChart) OR . A paper copy in the mail If you have any lab test that is abnormal or we need to change your treatment, we will call you to review the results.  Testing/Procedures: Your physician has requested that you have a cardiac catheterization. Cardiac catheterization is used to diagnose and/or treat various heart conditions. Doctors may recommend this procedure for a number of different reasons. The most common reason is to evaluate chest pain. Chest pain can be a symptom of coronary artery disease (CAD), and cardiac catheterization can show whether plaque is narrowing or blocking your heart's arteries. This procedure is also used to evaluate the valves, as well as measure the blood flow and oxygen levels in different parts of your heart. For further information please visit [email or web address] Please follow instruction sheet, BELOW:     [facility] CARDIOVASCULAR DIVISION [facility] NORTHLINE [address] [place] [address] [place] [address] Dept: [phone] Loc: [phone]  Omar Sandoval  09/22/2018  You are scheduled for a Cardiac Catheterization on Tuesday, February 4 with Dr. Verdis Prime.  1. Please arrive at the [facility] (Main Entrance A) at [facility]: [address] [place], [place] [address] at 8:30 AM (This time is two hours before your procedure to ensure your preparation). Free valet parking service is available.   Special note: Every effort is made to have your procedure done  on time. Please understand that emergencies sometimes delay scheduled procedures.  2. Diet: Do not eat solid foods after midnight.  The patient may have clear liquids until 5am upon the day of the procedure.  3. Labs: You will need to have blood drawn on  TODAY  4. Medication instructions in preparation for your procedure:   Contrast Allergy: No  On the morning of your procedure, take your Aspirin and any morning medicines NOT listed above.  You may use sips of water.  5. Plan for one night stay--bring personal belongings. 6. Bring a current list of your medications and current insurance cards. 7. You MUST have a responsible person to drive you home. 8. Someone MUST be with you the first 24 hours after you arrive home or your discharge will be delayed. 9. Please wear clothes that are easy to get on and off and wear slip-on shoes.  Thank you for allowing [place] to care for you!   -- [facility]  Follow-Up: Your physician recommends that you schedule a follow-up appointment in:  10/13/2018 ARRIVE AT 8:15 TO SEE Omar MENG, PA-C   Any Other Special Instructions Will Be Listed Below (If Applicable).

## 2018-09-22 NOTE — H&P (View-Only) (Signed)
Cardiology Office Note    Date:  09/24/2018   ID:  Baby, Stairs 10-21-1955, MRN [id number]  PCP:  Eyvonne Mechanic, MD  Cardiologist:  Dr. Tamala Julian  No chief complaint on file.   History of Present Illness:  Omar Sandoval is a 63 y.o. male with PMH of HIV, HTN, HLD, PVC, chronic combined systolic and diastolic heart failure and CAD.  He had DES to distal RCA in June 2018 for inferior STEMI, he also had 70% proximal to mid LAD residual and 60 to 70% distal left circumflex residual, EF was 40 to 50%. Echocardiogram obtained after the procedure on 02/06/2017 showed EF 45 to 50%, septal mid and basal inferior wall hypokinesis with mild LVH, mild MR. Patient was last seen by Dr. Tamala Julian on 07/08/2018 who recommended a outpatient sleep study.  His Brilinta was stopped at that time.    I saw the patient last Friday on 09/18/2018 for intermittent burning sensation in the left side of the chest.  This is slightly different from his previous angina which he described as a sharp stabbing pain in the same area.  He says his chest discomfort is brought on when he gets into argument with his landlord.  He faces possible eviction from his apartment as his landlord does not like his pitbull terrier.  His blood pressure was very low in the 80s when I saw him.  I was quite concerned about his cardiac condition despite the fact his symptom was atypical.  I discontinued his low-dose lisinopril while continued on low-dose beta-blocker.  I recommended urgent stress test.  This was done on 09/21/2018 which revealed EF of 20%, global hypokinesia with paradoxical motion along the inferior wall, fixed defect involving the inferior wall consistent with the previous inferior MI, reversibility involving the apical segment of anterior wall suggestive of ischemia.  This stress test was reviewed by Dr. Tamala Julian his primary cardiologist who also recommended cardiac catheterization.  However patient tells me today that he has a upcoming surgery  tomorrow.  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  I think the procedure itself tomorrow is relatively low risk, however his cardiac condition does place him at high risk for the intended procedure. The question at this time is whether to proceed with rectum ablation first or whether to proceed with cardiac catheterization first.  Rectum ablation is considered low risk procedure, however he has been off of Brilinta for the past month.  If he were to get a stent, rectum ablation may be delayed for several month.    I did eventually reach Dr. Janalyn Harder, tomorrow's procedure has already been canceled by endoscopy.  I also discussed the case with Dr. Tamala Julian as well, we plan to cath the patient this Friday.  Risk and benefit of the procedure has been discussed with the patient.     Past Medical History:  Diagnosis Date  . HIV (human immunodeficiency virus infection) ([place])   . Immune deficiency disorder ([place])   . Inguinal hernia    Left  . Myocardial infarction [facility])    01-2017 angioplasty with stents    Past Surgical History:  Procedure Laterality Date  . COLONOSCOPY    . CORONARY ANGIOPLASTY    . CORONARY/GRAFT ACUTE MI REVASCULARIZATION N/A 02/03/2017  Procedure: Coronary/Graft Acute MI Revascularization;  Surgeon: Belva Crome, MD;  Location: [place] [facility];  Service: Cardiovascular;  Laterality: N/A;  . FRACTURE SURGERY     left ankle and right foot  . HERNIA REPAIR    . INGUINAL HERNIA REPAIR Left 08/27/2017   Procedure: LAPAROSCOPIC LEFT INGUINAL HERNIA REPAIR WITH MESH;  Surgeon: Ralene Ok, MD;  Location: [place];  Service: General;  Laterality: Left;  . INSERTION OF MESH Left 08/27/2017   Procedure: INSERTION OF MESH;  Surgeon: Ralene Ok, MD;   Location: [facility];  Service: General;  Laterality: Left;  . LEFT HEART CATH AND CORONARY ANGIOGRAPHY N/A 02/03/2017   Procedure: Left Heart Cath and Coronary Angiography;  Surgeon: Belva Crome, MD;  Location: [place] [facility];  Service: Cardiovascular;  Laterality: N/A;  . OPEN REDUCTION INTERNAL FIXATION (ORIF) DISTAL RADIAL FRACTURE Right 07/20/2016   Procedure: OPEN REDUCTION INTERNAL FIXATION (ORIF) DISTAL RADIAL FRACTURE;  Surgeon: Iran Planas, MD;  Location: [place];  Service: Orthopedics;  Laterality: Right;  . SURGERY SCROTAL / TESTICULAR     nondecended testes    Current Medications: Outpatient Medications Prior to Visit  Medication Sig Dispense Refill  . aspirin 81 MG chewable tablet Chew 1 tablet (81 mg total) by mouth daily.    [name] atorvastatin (LIPITOR) 40 MG tablet TAKE 1 TABLET BY MOUTH ONCE DAILY AT  6PM. 90 tablet 2  . Dolutegravir-lamiVUDine 50-300 MG TABS Take 50-300 mg by mouth daily.    . metoprolol succinate (TOPROL-XL) 25 MG 24 hr tablet TAKE 1/2 (ONE-HALF) TABLET BY MOUTH TWICE DAILY 90 tablet 2  . NARCAN 4 MG/0.1ML LIQD nasal spray kit Place 1 spray into alternate nostrils as directed.  0  . nitroGLYCERIN (NITROSTAT) 0.4 MG SL tablet Place 1 tablet (0.4 mg total) under the tongue every 5 (five) minutes x 3 doses as needed for chest pain. 25 tablet 6  . oxyCODONE-acetaminophen (PERCOCET) 10-325 MG tablet Take 0.5-1 tablets by mouth every 4 (four) hours as needed for pain.    [name] BRILINTA 90 MG TABS tablet TAKE 1 TABLET BY MOUTH TWICE DAILY (Patient not taking: Reported on 09/22/2018) 180 tablet 2   No facility-administered medications prior to visit.      Allergies:   Chocolate; Lac bovis; Lactose intolerance (gi); and Vicodin [hydrocodone-acetaminophen]   Social History   Socioeconomic History  . Marital status: Single    Spouse name: Not on file  . Number of children: Not on file  . Years of education: Not on file  . Highest education level: Not on file    Occupational History  . Not on file  Social Needs  . Financial resource strain: Not on file  . Food insecurity:    Worry: Not on file    Inability: Not on file  . Transportation needs:    Medical: Not on file    Non-medical: Not on file  Tobacco Use  . Smoking status: Former Smoker    Packs/day: 0.50    Years: 45.00    Pack years: 22.50    Types: Cigarettes    Last attempt to quit: 2013    Years since quitting: 7.0  . Smokeless tobacco: Never Used  Substance and Sexual Activity  . Alcohol use: Yes    Alcohol/week: 3.0 standard drinks    Types: 3 Cans of beer per week    Comment: beer daily  . Drug use: Yes    Types: Marijuana    Comment: used  Marijuana  . Sexual activity: Not on file  Lifestyle  . Physical activity:    Days per week: Not on file    Minutes per session: Not on file  . Stress: Not on file  Relationships  . Social connections:    Talks on phone: Not on file    Gets together: Not on file    Attends religious service: Not on file    Active member of club or organization: Not on file    Attends meetings of clubs or organizations: Not on file    Relationship status: Not on file  Other Topics Concern  . Not on file  Social History Narrative  . Not on file     Family History:  The patient's family history includes Cancer in his brother and mother.   ROS:   Please see the history of present illness.    ROS All other systems reviewed and are negative.   PHYSICAL EXAM:   VS:  BP 136/80   Pulse 75   Ht '5\' 9"'$  (1.753 m)   Wt 209 lb (94.8 kg)   SpO2 99%   BMI 30.86 kg/m    GEN: Well nourished, well developed, in no acute distress  HEENT: normal  Neck: no JVD, carotid bruits, or masses Cardiac: RRR; no murmurs, rubs, or gallops,no edema  Respiratory:  clear to auscultation bilaterally, normal work of breathing GI: soft, nontender, nondistended, + BS MS: no deformity or atrophy  Skin: warm and dry, no rash Neuro:  Alert and Oriented x 3,  Strength and sensation are intact Psych: euthymic mood, full affect  Wt Readings from Last 3 Encounters:  09/22/18 209 lb (94.8 kg)  09/18/18 204 lb 9.6 oz (92.8 kg)  07/08/18 199 lb 1.9 oz (90.3 kg)      Studies/Labs Reviewed:   EKG:  EKG is not ordered today.   Recent Labs: 09/18/2018: ALT 20; BUN 8; Creatinine, Ser 0.88; Potassium 4.4; Sodium 144; TSH 0.995   Lipid Panel    Component Value Date/Time   CHOL 126 07/08/2018 0901   TRIG 43 07/08/2018 0901   HDL 46 07/08/2018 0901   CHOLHDL 2.7 07/08/2018 0901   CHOLHDL 3.3 02/03/2017 1323   VLDL 8 02/03/2017 1323   LDLCALC 71 07/08/2018 0901    Additional studies/ records that were reviewed today include:   Myoview 09/21/2018 IMPRESSION: 1. Reversibility involving the apical segment of the anterior wall. Findings are suggestive for ischemia.  2. Fixed defect involving the inferior wall is suggestive for infarct.  3. Left ventricular ejection fraction is 20%. Global hypokinesia with paradoxical motion along the inferior wall.  4. Non invasive risk stratification*: High    ASSESSMENT:    1. Abnormal stress ECG   2. Medication management   3. Preprocedural examination   4. HIV infection, unspecified symptom status ([place])   5. Essential hypertension   6. Hyperlipidemia, unspecified hyperlipidemia type   7. Chronic combined systolic and diastolic heart failure (H[facility])      PLAN:  In order of problems listed above:  1. Abnormal stress test: Although his chest pain is somewhat atypical, mainly triggered by emotional stress.  However I am highly concerned of the frequency of the symptom and he is very low blood pressure in the 80s last Friday.  I obtained a urgent stress test for this Monday, however this came back severely abnormal with EF down to 20%.  We will plan for cardiac catheterization this Friday.  He was originally planned  for anorectal ablation secondary to hyperplasia which may cause rectal cancer  tomorrow, this procedures canceled for the time being.  - Risk and benefit of procedure explained to the patient who display clear understanding and agree to proceed.  Discussed with patient possible procedural risk include bleeding, vascular injury, renal injury, arrythmia, MI, stroke and loss of limb or life.  2. HIV: He is being followed by infectious disease doctor Dr. Janalyn Harder at [facility].  He was originally planned for a anal rectal ablation for hyperplasia tomorrow, this has been held off for the time being.  3. Hypertension: Blood pressure stable after I discontinued his lisinopril last Friday.  His blood pressure was in the 80s last Friday, this has improved to 130s today  4. Hyperlipidemia: On Lipitor 40 mg daily  5. Chronic systolic and diastolic heart failure: Appears to be euvolemic on physical exam.  Previous EF was 45%, now 20% Myoview.  I still plan to restart lisinopril after cardiac catheterization if his blood pressure is stable.  Unclear what triggered last Friday's hypotension episode, I reviewed the EKG with DOD at the time, there was no obvious signs of ACS.[name]    Medication Adjustments/Labs and Tests Ordered: Current medicines are reviewed at length with the patient today.  Concerns regarding medicines are outlined above.  Medication changes, Labs and Tests ordered today are listed in the Patient Instructions below. Patient Instructions  Medication Instructions:  Your physician recommends that you continue on your current medications as directed. Please refer to the Current Medication list given to you today.  If you need a refill on your cardiac medications before your next appointment, please call your pharmacy.   Lab work: TODAY:  CBC  If you have labs (blood work) drawn today and your tests are completely normal, you will receive your results only by: [name] MyChart Message (if you have MyChart) OR . A paper copy in the mail If you have any lab test that is  abnormal or we need to change your treatment, we will call you to review the results.  Testing/Procedures: Your physician has requested that you have a cardiac catheterization. Cardiac catheterization is used to diagnose and/or treat various heart conditions. Doctors may recommend this procedure for a number of different reasons. The most common reason is to evaluate chest pain. Chest pain can be a symptom of coronary artery disease (CAD), and cardiac catheterization can show whether plaque is narrowing or blocking your heart's arteries. This procedure is also used to evaluate the valves, as well as measure the blood flow and oxygen levels in different parts of your heart. For further information please visit [email or web address]. Please follow instruction sheet, BELOW:     [address] [place] [address] Dept: [phone] Loc: [place]  09/22/2018  You are scheduled for a Cardiac Catheterization on Tuesday, February 4 with Dr. Daneen Schick.  1. Please arrive at the [facility] (Main Entrance A) at [facility]: [address] [place], [place] [address] at 8:30 AM (This time is two hours before your procedure to ensure your preparation). Free valet parking service is available.   Special note: Every effort is made to have your procedure done on time. Please understand that emergencies sometimes delay scheduled procedures.  2. Diet: Do not eat solid foods after midnight.  The patient may have clear liquids until 5am upon the day of the procedure.  3.  Labs: You will need to have blood drawn on  TODAY  4. Medication instructions in preparation for your procedure:   Contrast Allergy: No  On the morning of your procedure, take your Aspirin and any morning medicines NOT listed above.  You may use sips of water.  5. Plan for one night stay--bring personal  belongings. 6. Bring a current list of your medications and current insurance cards. 7. You MUST have a responsible person to drive you home. 8. Someone MUST be with you the first 24 hours after you arrive home or your discharge will be delayed. 9. Please wear clothes that are easy to get on and off and wear slip-on shoes.  Thank you for allowing [place] to care for you!   --  Invasive Cardiovascular services  Follow-Up: Your physician recommends that you schedule a follow-up appointment in:  10/13/2018 ARRIVE AT 8:15 TO SEE Cheridan Kibler, PA-C   Any Other Special Instructions Will Be Listed Below (If Applicable).       Hilbert Corrigan, [place]  09/24/2018 12:25 AM    [facility] [place], [place], [place]  [address] Phone: [phone]; Fax: [phone]

## 2018-09-22 NOTE — Progress Notes (Signed)
Cardiology Office Note    Date:  09/24/2018   ID:  Omar Sandoval [birth year], MRN [id number]  PCP:  Omar Mechanic, MD  Cardiologist:  Dr. Tamala Julian  No chief complaint on file.   History of Present Illness:  Omar Sandoval is a 63 y.o. male with PMH of HIV, HTN, HLD, PVC, chronic combined systolic and diastolic heart failure and CAD.  He had DES to distal RCA in June 2018 for inferior STEMI, he also had 70% proximal to mid LAD residual and 60 to 70% distal left circumflex residual, EF was 40 to 50%. Echocardiogram obtained after the procedure on 02/06/2017 showed EF 45 to 50%, septal mid and basal inferior wall hypokinesis with mild LVH, mild MR. Patient was last seen by Dr. Tamala Julian on 07/08/2018 who recommended a outpatient sleep study.  His Brilinta was stopped at that time.    I saw the patient last Friday on 09/18/2018 for intermittent burning sensation in the left side of the chest.  This is slightly different from his previous angina which he described as a sharp stabbing pain in the same area.  He says his chest discomfort is brought on when he gets into argument with his landlord.  He faces possible eviction from his apartment as his landlord does not like his pitbull terrier.  His blood pressure was very low in the 80s when I saw him.  I was quite concerned about his cardiac condition despite the fact his symptom was atypical.  I discontinued his low-dose lisinopril while continued on low-dose beta-blocker.  I recommended urgent stress test.  This was done on 09/21/2018 which revealed EF of 20%, global hypokinesia with paradoxical motion along the inferior wall, fixed defect involving the inferior wall consistent with the previous inferior MI, reversibility involving the apical segment of anterior wall suggestive of ischemia.  This stress test was reviewed by Dr. Tamala Julian his primary cardiologist who also recommended cardiac catheterization.  However patient tells me today that he has a upcoming surgery  tomorrow.  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  I think the procedure itself tomorrow is relatively low risk, however his cardiac condition does place him at high risk for the intended procedure. The question at this time is whether to proceed with rectum ablation first or whether to proceed with cardiac catheterization first.  Rectum ablation is considered low risk procedure, however he has been off of Brilinta for the past month.  If he were to get a stent, rectum ablation may be delayed for several month.    I did eventually reach Dr. Janalyn Harder, tomorrow's procedure has already been canceled by endoscopy.  I also discussed the case with Dr. Tamala Julian as well, we plan to cath the patient this Friday.  Risk and benefit of the procedure has been discussed with the patient.     Past Medical History:  Diagnosis Date  . HIV (human immunodeficiency virus infection) ([place])   . Immune deficiency disorder ([place])   . Inguinal hernia    Left  . Myocardial infarction [facility])    01-2017 angioplasty with stents    Past Surgical History:  Procedure Laterality Date  . COLONOSCOPY    . CORONARY ANGIOPLASTY    . CORONARY/GRAFT ACUTE MI REVASCULARIZATION N/A 02/03/2017  Procedure: Coronary/Graft Acute MI Revascularization;  Surgeon: Belva Crome, MD;  Location: [place] [facility];  Service: Cardiovascular;  Laterality: N/A;  . FRACTURE SURGERY     left ankle and right foot  . HERNIA REPAIR    . INGUINAL HERNIA REPAIR Left 08/27/2017   Procedure: LAPAROSCOPIC LEFT INGUINAL HERNIA REPAIR WITH MESH;  Surgeon: Ralene Ok, MD;  Location: [place];  Service: General;  Laterality: Left;  . INSERTION OF MESH Left 08/27/2017   Procedure: INSERTION OF MESH;  Surgeon: Ralene Ok, MD;   Location: [place];  Service: General;  Laterality: Left;  . LEFT HEART CATH AND CORONARY ANGIOGRAPHY N/A 02/03/2017   Procedure: Left Heart Cath and Coronary Angiography;  Surgeon: Belva Crome, MD;  Location: [place] [facility];  Service: Cardiovascular;  Laterality: N/A;  . OPEN REDUCTION INTERNAL FIXATION (ORIF) DISTAL RADIAL FRACTURE Right 07/20/2016   Procedure: OPEN REDUCTION INTERNAL FIXATION (ORIF) DISTAL RADIAL FRACTURE;  Surgeon: Iran Planas, MD;  Location: [place];  Service: Orthopedics;  Laterality: Right;  . SURGERY SCROTAL / TESTICULAR     nondecended testes    Current Medications: Outpatient Medications Prior to Visit  Medication Sig Dispense Refill  . aspirin 81 MG chewable tablet Chew 1 tablet (81 mg total) by mouth daily.    [name] atorvastatin (LIPITOR) 40 MG tablet TAKE 1 TABLET BY MOUTH ONCE DAILY AT  6PM. 90 tablet 2  . Dolutegravir-lamiVUDine 50-300 MG TABS Take 50-300 mg by mouth daily.    . metoprolol succinate (TOPROL-XL) 25 MG 24 hr tablet TAKE 1/2 (ONE-HALF) TABLET BY MOUTH TWICE DAILY 90 tablet 2  . NARCAN 4 MG/0.1ML LIQD nasal spray kit Place 1 spray into alternate nostrils as directed.  0  . nitroGLYCERIN (NITROSTAT) 0.4 MG SL tablet Place 1 tablet (0.4 mg total) under the tongue every 5 (five) minutes x 3 doses as needed for chest pain. 25 tablet 6  . oxyCODONE-acetaminophen (PERCOCET) 10-325 MG tablet Take 0.5-1 tablets by mouth every 4 (four) hours as needed for pain.    [name] BRILINTA 90 MG TABS tablet TAKE 1 TABLET BY MOUTH TWICE DAILY (Patient not taking: Reported on 09/22/2018) 180 tablet 2   No facility-administered medications prior to visit.      Allergies:   Chocolate; Lac bovis; Lactose intolerance (gi); and Vicodin [hydrocodone-acetaminophen]   Social History   Socioeconomic History  . Marital status: Single    Spouse name: Not on file  . Number of children: Not on file  . Years of education: Not on file  . Highest education level: Not on file    Occupational History  . Not on file  Social Needs  . Financial resource strain: Not on file  . Food insecurity:    Worry: Not on file    Inability: Not on file  . Transportation needs:    Medical: Not on file    Non-medical: Not on file  Tobacco Use  . Smoking status: Former Smoker    Packs/day: 0.50    Years: 45.00    Pack years: 22.50    Types: Cigarettes    Last attempt to quit: 2013    Years since quitting: 7.0  . Smokeless tobacco: Never Used  Substance and Sexual Activity  . Alcohol use: Yes    Alcohol/week: 3.0 standard drinks    Types: 3 Cans of beer per week    Comment: beer daily  . Drug use: Yes    Types: Marijuana    Comment: used  Marijuana  . Sexual activity: Not on file  Lifestyle  . Physical activity:    Days per week: Not on file    Minutes per session: Not on file  . Stress: Not on file  Relationships  . Social connections:    Talks on phone: Not on file    Gets together: Not on file    Attends religious service: Not on file    Active member of club or organization: Not on file    Attends meetings of clubs or organizations: Not on file    Relationship status: Not on file  Other Topics Concern  . Not on file  Social History Narrative  . Not on file     Family History:  The patient's family history includes Cancer in his brother and mother.   ROS:   Please see the history of present illness.    ROS All other systems reviewed and are negative.   PHYSICAL EXAM:   VS:  BP 136/80   Pulse 75   Ht '5\' 9"'$  (1.753 m)   Wt 209 lb (94.8 kg)   SpO2 99%   BMI 30.86 kg/m    GEN: Well nourished, well developed, in no acute distress  HEENT: normal  Neck: no JVD, carotid bruits, or masses Cardiac: RRR; no murmurs, rubs, or gallops,no edema  Respiratory:  clear to auscultation bilaterally, normal work of breathing GI: soft, nontender, nondistended, + BS MS: no deformity or atrophy  Skin: warm and dry, no rash Neuro:  Alert and Oriented x 3,  Strength and sensation are intact Psych: euthymic mood, full affect  Wt Readings from Last 3 Encounters:  09/22/18 209 lb (94.8 kg)  09/18/18 204 lb 9.6 oz (92.8 kg)  07/08/18 199 lb 1.9 oz (90.3 kg)      Studies/Labs Reviewed:   EKG:  EKG is not ordered today.   Recent Labs: 09/18/2018: ALT 20; BUN 8; Creatinine, Ser 0.88; Potassium 4.4; Sodium 144; TSH 0.995   Lipid Panel    Component Value Date/Time   CHOL 126 07/08/2018 0901   TRIG 43 07/08/2018 0901   HDL 46 07/08/2018 0901   CHOLHDL 2.7 07/08/2018 0901   CHOLHDL 3.3 02/03/2017 1323   VLDL 8 02/03/2017 1323   LDLCALC 71 07/08/2018 0901    Additional studies/ records that were reviewed today include:   Myoview 09/21/2018 IMPRESSION: 1. Reversibility involving the apical segment of the anterior wall. Findings are suggestive for ischemia.  2. Fixed defect involving the inferior wall is suggestive for infarct.  3. Left ventricular ejection fraction is 20%. Global hypokinesia with paradoxical motion along the inferior wall.  4. Non invasive risk stratification*: High    ASSESSMENT:    1. Abnormal stress ECG   2. Medication management   3. Preprocedural examination   4. HIV infection, unspecified symptom status ([place])   5. Essential hypertension   6. Hyperlipidemia, unspecified hyperlipidemia type   7. Chronic combined systolic and diastolic heart failure (H[facility])      PLAN:  In order of problems listed above:  1. Abnormal stress test: Although his chest pain is somewhat atypical, mainly triggered by emotional stress.  However I am highly concerned of the frequency of the symptom and he is very low blood pressure in the 80s last Friday.  I obtained a urgent stress test for this Monday, however this came back severely abnormal with EF down to 20%.  We will plan for cardiac catheterization this Friday.  He was originally planned  for anorectal ablation secondary to hyperplasia which may cause rectal cancer  tomorrow, this procedures canceled for the time being.  - Risk and benefit of procedure explained to the patient who display clear understanding and agree to proceed.  Discussed with patient possible procedural risk include bleeding, vascular injury, renal injury, arrythmia, MI, stroke and loss of limb or life.  2. HIV: He is being followed by infectious disease doctor Dr. Janalyn Harder at [facility].  He was originally planned for a anal rectal ablation for hyperplasia tomorrow, this has been held off for the time being.  3. Hypertension: Blood pressure stable after I discontinued his lisinopril last Friday.  His blood pressure was in the 80s last Friday, this has improved to 130s today  4. Hyperlipidemia: On Lipitor 40 mg daily  5. Chronic systolic and diastolic heart failure: Appears to be euvolemic on physical exam.  Previous EF was 45%, now 20% Myoview.  I still plan to restart lisinopril after cardiac catheterization if his blood pressure is stable.  Unclear what triggered last Friday's hypotension episode, I reviewed the EKG with DOD at the time, there was no obvious signs of ACS.[name]    Medication Adjustments/Labs and Tests Ordered: Current medicines are reviewed at length with the patient today.  Concerns regarding medicines are outlined above.  Medication changes, Labs and Tests ordered today are listed in the Patient Instructions below. Patient Instructions  Medication Instructions:  Your physician recommends that you continue on your current medications as directed. Please refer to the Current Medication list given to you today.  If you need a refill on your cardiac medications before your next appointment, please call your pharmacy.   Lab work: TODAY:  CBC  If you have labs (blood work) drawn today and your tests are completely normal, you will receive your results only by: [name] MyChart Message (if you have MyChart) OR . A paper copy in the mail If you have any lab test that is  abnormal or we need to change your treatment, we will call you to review the results.  Testing/Procedures: Your physician has requested that you have a cardiac catheterization. Cardiac catheterization is used to diagnose and/or treat various heart conditions. Doctors may recommend this procedure for a number of different reasons. The most common reason is to evaluate chest pain. Chest pain can be a symptom of coronary artery disease (CAD), and cardiac catheterization can show whether plaque is narrowing or blocking your heart's arteries. This procedure is also used to evaluate the valves, as well as measure the blood flow and oxygen levels in different parts of your heart. For further information please visit [facility]uge[facility]iesta.tn. Please follow instruction sheet, BELOW:     [address] [place] [address] Dept: [phone] Loc: [place]  09/22/2018  You are scheduled for a Cardiac Catheterization on Tuesday, February 4 with Dr. Daneen Schick.  1. Please arrive at the [facility] (Main Entrance A) at [facility]: [address] [place], [place] [address] at 8:30 AM (This time is two hours before your procedure to ensure your preparation). Free valet parking service is available.   Special note: Every effort is made to have your procedure done on time. Please understand that emergencies sometimes delay scheduled procedures.  2. Diet: Do not eat solid foods after midnight.  The patient may have clear liquids until 5am upon the day of the procedure.  3.  Labs: You will need to have blood drawn on  TODAY  4. Medication instructions in preparation for your procedure:   Contrast Allergy: No  On the morning of your procedure, take your Aspirin and any morning medicines NOT listed above.  You may use sips of water.  5. Plan for one night stay--bring personal  belongings. 6. Bring a current list of your medications and current insurance cards. 7. You MUST have a responsible person to drive you home. 8. Someone MUST be with you the first 24 hours after you arrive home or your discharge will be delayed. 9. Please wear clothes that are easy to get on and off and wear slip-on shoes.  Thank you for allowing [place] to care for you!   -- [facility]  Follow-Up: Your physician recommends that you schedule a follow-up appointment in:  10/13/2018 ARRIVE AT 8:15 TO SEE Awa Bachicha, PA-C   Any Other Special Instructions Will Be Listed Below (If Applicable).       Hilbert Corrigan, [place]  09/24/2018 12:25 AM    [facility] [place], [place], [place]  [address] Phone: [phone]; Fax: [phone]

## 2018-09-22 NOTE — Addendum Note (Signed)
Addended by: Alyson Ingles on: 09/22/2018 01:54 PM   Modules accepted: Orders

## 2018-09-23 ENCOUNTER — Ambulatory Visit: Payer: Medicare HMO | Admitting: Cardiology

## 2018-09-24 ENCOUNTER — Other Ambulatory Visit: Payer: Self-pay | Admitting: Physician Assistant

## 2018-09-24 ENCOUNTER — Telehealth: Payer: Self-pay | Admitting: *Deleted

## 2018-09-24 NOTE — Telephone Encounter (Addendum)
Pt contacted pre-catheterization scheduled at [facility] for: Friday September 25, 2018 9 AM Verified arrival time and place: [facility] Main Entrance A at: 7 AM  No solid food after midnight prior to cath, clear liquids until 5 AM day of procedure. Contrast allergy: no Confirmed no diabetes medications  AM meds can be  taken pre-cath with sip of water including: ASA 81 mg  Confirmed patient has responsible person to drive home post procedure and observe for  24 hours after you arrive home: yes

## 2018-09-25 ENCOUNTER — Encounter (HOSPITAL_COMMUNITY)
Admission: RE | Disposition: A | Discharge status: Home / Self Care | Payer: Self-pay | Source: Home / Self Care | Attending: Interventional Cardiology

## 2018-09-25 ENCOUNTER — Encounter (HOSPITAL_COMMUNITY): Payer: Self-pay | Admitting: Interventional Cardiology

## 2018-09-25 ENCOUNTER — Ambulatory Visit: Payer: Medicare HMO | Admitting: Physician Assistant

## 2018-09-25 ENCOUNTER — Other Ambulatory Visit: Payer: Self-pay

## 2018-09-25 ENCOUNTER — Ambulatory Visit (HOSPITAL_COMMUNITY)
Admission: RE | Admit: 2018-09-25 | Discharge: 2018-09-25 | Disposition: A | Discharge status: Home / Self Care | Payer: Medicare HMO | Attending: Interventional Cardiology | Admitting: Interventional Cardiology

## 2018-09-25 DIAGNOSIS — Z87891 Personal history of nicotine dependence: Secondary | ICD-10-CM | POA: Diagnosis not present

## 2018-09-25 DIAGNOSIS — I252 Old myocardial infarction: Secondary | ICD-10-CM | POA: Insufficient documentation

## 2018-09-25 DIAGNOSIS — B20 Human immunodeficiency virus [HIV] disease: Secondary | ICD-10-CM | POA: Diagnosis not present

## 2018-09-25 DIAGNOSIS — Z955 Presence of coronary angioplasty implant and graft: Secondary | ICD-10-CM | POA: Diagnosis not present

## 2018-09-25 DIAGNOSIS — Z885 Allergy status to narcotic agent status: Secondary | ICD-10-CM | POA: Diagnosis not present

## 2018-09-25 DIAGNOSIS — I2584 Coronary atherosclerosis due to calcified coronary lesion: Secondary | ICD-10-CM | POA: Insufficient documentation

## 2018-09-25 DIAGNOSIS — I25758 Atherosclerosis of native coronary artery of transplanted heart with other forms of angina pectoris: Secondary | ICD-10-CM | POA: Diagnosis present

## 2018-09-25 DIAGNOSIS — Z7982 Long term (current) use of aspirin: Secondary | ICD-10-CM | POA: Insufficient documentation

## 2018-09-25 DIAGNOSIS — R931 Abnormal findings on diagnostic imaging of heart and coronary circulation: Secondary | ICD-10-CM | POA: Diagnosis not present

## 2018-09-25 DIAGNOSIS — Z79899 Other long term (current) drug therapy: Secondary | ICD-10-CM | POA: Diagnosis not present

## 2018-09-25 DIAGNOSIS — I25119 Atherosclerotic heart disease of native coronary artery with unspecified angina pectoris: Secondary | ICD-10-CM | POA: Diagnosis not present

## 2018-09-25 DIAGNOSIS — I110 Hypertensive heart disease with heart failure: Secondary | ICD-10-CM | POA: Insufficient documentation

## 2018-09-25 DIAGNOSIS — Z888 Allergy status to other drugs, medicaments and biological substances status: Secondary | ICD-10-CM | POA: Insufficient documentation

## 2018-09-25 DIAGNOSIS — E785 Hyperlipidemia, unspecified: Secondary | ICD-10-CM | POA: Insufficient documentation

## 2018-09-25 DIAGNOSIS — I5042 Chronic combined systolic (congestive) and diastolic (congestive) heart failure: Secondary | ICD-10-CM | POA: Diagnosis not present

## 2018-09-25 HISTORY — PX: LEFT HEART CATH AND CORONARY ANGIOGRAPHY: CATH118249

## 2018-09-25 SURGERY — LEFT HEART CATH AND CORONARY ANGIOGRAPHY
Anesthesia: LOCAL

## 2018-09-25 MED ORDER — IOHEXOL 350 MG/ML SOLN
INTRAVENOUS | Status: DC | PRN
  Administered 2018-09-25: 90 mL

## 2018-09-25 MED ORDER — VERAPAMIL HCL 2.5 MG/ML IV SOLN
INTRAVENOUS | Status: DC | PRN
  Administered 2018-09-25: 10 mL via INTRA_ARTERIAL

## 2018-09-25 MED ORDER — ONDANSETRON HCL 4 MG/2ML IJ SOLN: 4.0000 mg | Freq: Four times a day (QID) | INTRAMUSCULAR | Status: DC | PRN

## 2018-09-25 MED ORDER — NITROGLYCERIN 1 MG/10 ML FOR IR/CATH LAB
INTRA_ARTERIAL | Status: DC | PRN
  Administered 2018-09-25: 200 ug via INTRACORONARY

## 2018-09-25 MED ORDER — SODIUM CHLORIDE 0.9% FLUSH: 3.0000 mL | Freq: Two times a day (BID) | INTRAVENOUS | Status: DC

## 2018-09-25 MED ORDER — HEPARIN (PORCINE) IN NACL 1000-0.9 UT/500ML-% IV SOLN
INTRAVENOUS | Status: DC | PRN
  Administered 2018-09-25: 500 mL

## 2018-09-25 MED ORDER — LIDOCAINE HCL (PF) 1 % IJ SOLN
INTRAMUSCULAR | Status: DC | PRN
  Administered 2018-09-25: 2 mL

## 2018-09-25 MED ORDER — HEPARIN (PORCINE) IN NACL 1000-0.9 UT/500ML-% IV SOLN
INTRAVENOUS | Status: DC | PRN
  Administered 2018-09-25 (×2): 500 mL

## 2018-09-25 MED ORDER — HEPARIN (PORCINE) IN NACL 1000-0.9 UT/500ML-% IV SOLN
INTRAVENOUS | Status: AC
  Filled 2018-09-25: qty 1000

## 2018-09-25 MED ORDER — SODIUM CHLORIDE 0.9 % WEIGHT BASED INFUSION
3.0000 mL/kg/h | INTRAVENOUS | Status: AC
  Administered 2018-09-25: 3 mL/kg/h via INTRAVENOUS

## 2018-09-25 MED ORDER — MIDAZOLAM HCL 2 MG/2ML IJ SOLN
INTRAMUSCULAR | Status: DC | PRN
  Administered 2018-09-25: 1 mg via INTRAVENOUS

## 2018-09-25 MED ORDER — LIDOCAINE HCL (PF) 1 % IJ SOLN
INTRAMUSCULAR | Status: AC
  Filled 2018-09-25: qty 30

## 2018-09-25 MED ORDER — FENTANYL CITRATE (PF) 100 MCG/2ML IJ SOLN
INTRAMUSCULAR | Status: DC | PRN
  Administered 2018-09-25: 25 ug via INTRAVENOUS

## 2018-09-25 MED ORDER — ACETAMINOPHEN 325 MG PO TABS: 650.0000 mg | ORAL_TABLET | ORAL | Status: DC | PRN

## 2018-09-25 MED ORDER — SODIUM CHLORIDE 0.9 % IV SOLN: INTRAVENOUS | Status: DC

## 2018-09-25 MED ORDER — ASPIRIN 81 MG PO CHEW: 81.0000 mg | CHEWABLE_TABLET | ORAL | Status: DC

## 2018-09-25 MED ORDER — HEPARIN SODIUM (PORCINE) 1000 UNIT/ML IJ SOLN
INTRAMUSCULAR | Status: DC | PRN
  Administered 2018-09-25: 4500 [IU] via INTRAVENOUS

## 2018-09-25 MED ORDER — SODIUM CHLORIDE 0.9 % WEIGHT BASED INFUSION: 1.0000 mL/kg/h | INTRAVENOUS | Status: DC

## 2018-09-25 MED ORDER — SODIUM CHLORIDE 0.9 % IV SOLN: 250.0000 mL | INTRAVENOUS | Status: DC | PRN

## 2018-09-25 MED ORDER — FENTANYL CITRATE (PF) 100 MCG/2ML IJ SOLN
INTRAMUSCULAR | Status: AC
  Filled 2018-09-25: qty 2

## 2018-09-25 MED ORDER — HEPARIN (PORCINE) IN NACL 1000-0.9 UT/500ML-% IV SOLN
INTRAVENOUS | Status: AC
  Filled 2018-09-25: qty 500

## 2018-09-25 MED ORDER — MIDAZOLAM HCL 2 MG/2ML IJ SOLN
INTRAMUSCULAR | Status: AC
  Filled 2018-09-25: qty 2

## 2018-09-25 MED ORDER — VERAPAMIL HCL 2.5 MG/ML IV SOLN
INTRAVENOUS | Status: AC
  Filled 2018-09-25: qty 2

## 2018-09-25 MED ORDER — SODIUM CHLORIDE 0.9% FLUSH: 3.0000 mL | INTRAVENOUS | Status: DC | PRN

## 2018-09-25 SURGICAL SUPPLY — 10 items
CATH 5FR JL3.5 JR4 ANG PIG MP (CATHETERS) ×2 IMPLANT
DEVICE RAD COMP TR BAND LRG (VASCULAR PRODUCTS) ×2 IMPLANT
GLIDESHEATH SLEND A-KIT 6F 22G (SHEATH) ×2 IMPLANT
GUIDEWIRE INQWIRE 1.5J.035X260 (WIRE) ×1 IMPLANT
INQWIRE 1.5J .035X260CM (WIRE) ×2
KIT HEART LEFT (KITS) ×2 IMPLANT
PACK CARDIAC CATHETERIZATION (CUSTOM PROCEDURE TRAY) ×2 IMPLANT
SHEATH PROBE COVER 6X72 (BAG) ×2 IMPLANT
TRANSDUCER W/STOPCOCK (MISCELLANEOUS) ×2 IMPLANT
TUBING CIL FLEX 10 FLL-RA (TUBING) ×2 IMPLANT

## 2018-09-25 NOTE — CV Procedure (Signed)
   Diagnostic coronary angiography performed via right radial using real-time vascular ultrasound for vascular access.  Calcified distal left main with 40 to 50% narrowing.    Complex proximal to mid LAD lesion in the 70 to 80% range.  This is unchanged from June 2018 and the most views actually appears improved.  Segmental 70% distal circumflex and 80% second obtuse marginal.  Right coronary is dominant and contains 30% in-stent restenosis.  Stable anatomy suggested we should continue medical therapy.  LVEF by hand-injection appears to be in the 35% range.  EDP is 18 mmHg.  2D Doppler echo to reassess LVEF and document better function than suggested by nuclear wall motion study (15%).

## 2018-09-25 NOTE — Interval H&P Note (Signed)
Cath Lab Visit (complete for each Cath Lab visit)  Clinical Evaluation Leading to the Procedure:   ACS: No.  Non-ACS:    Anginal Classification: CCS III  Anti-ischemic medical therapy: Minimal Therapy (1 class of medications)  Non-Invasive Test Results: Intermediate-risk stress test findings: cardiac mortality 1-3%/year  Prior CABG: No previous CABG      History and Physical Interval Note:  09/25/2018 8:46 AM  Omar Sandoval  has presented today for surgery, with the diagnosis of angina - positive stress  The various methods of treatment have been discussed with the patient and family. After consideration of risks, benefits and other options for treatment, the patient has consented to  Procedure(s): LEFT HEART CATH AND CORONARY ANGIOGRAPHY (N/A) as a surgical intervention .  The patient's history has been reviewed, patient examined, no change in status, stable for surgery.  I have reviewed the patient's chart and labs.  Questions were answered to the patient's satisfaction.     [name] III

## 2018-09-25 NOTE — Progress Notes (Signed)
Right wrist gauze with tegaderm clean, dry, intact armboard placed.

## 2018-09-25 NOTE — Discharge Instructions (Signed)
Drink plenty of fluids over next 48 hours and keep right wrist elevated at heart level for 24 hours ° °Radial Site Care ° °This sheet gives you information about how to care for yourself after your procedure. Your health care provider may also give you more specific instructions. If you have problems or questions, contact your health care provider. °What can I expect after the procedure? °After the procedure, it is common to have: °· Bruising and tenderness at the catheter insertion area. °Follow these instructions at home: °Medicines °· Take over-the-counter and prescription medicines only as told by your health care provider. °Insertion site care °· Follow instructions from your health care provider about how to take care of your insertion site. Make sure you: °? Wash your hands with soap and water before you change your bandage (dressing). If soap and water are not available, use hand sanitizer. °? Remove your dressing as told by your health care provider. In 24-48 hours °· Check your insertion site every day for signs of infection. Check for: °? Redness, swelling, or pain. °? Fluid or blood. °? Pus or a bad smell. °? Warmth. °· Do not take baths, swim, or use a hot tub until your health care provider approves. °· You may shower 24-48 hours after the procedure, or as directed by your health care provider. °? Remove the dressing and gently wash the site with plain soap and water. °? Pat the area dry with a clean towel. °? Do not rub the site. That could cause bleeding. °· Do not apply powder or lotion to the site. °Activity ° °· For 24 hours after the procedure, or as directed by your health care provider: °? Do not flex or bend the affected arm. °? Do not push or pull heavy objects with the affected arm. °? Do not drive yourself home from the hospital or clinic. You may drive 24 hours after the procedure unless your health care provider tells you not to. °? Do not operate machinery or power tools. °· Do not lift  anything that is heavier than 10 lb (4.5 kg), or the limit that you are told, until your health care provider says that it is safe. For 5 days °· Ask your health care provider when it is okay to: °? Return to work or school. °? Resume usual physical activities or sports. °? Resume sexual activity. °General instructions °· If the catheter site starts to bleed, raise your arm and put firm pressure on the site. If the bleeding does not stop, get help right away. This is a medical emergency. °· If you went home on the same day as your procedure, a responsible adult should be with you for the first 24 hours after you arrive home. °· Keep all follow-up visits as told by your health care provider. This is important. °Contact a health care provider if: °· You have a fever. °· You have redness, swelling, or yellow drainage around your insertion site. °Get help right away if: °· You have unusual pain at the radial site. °· The catheter insertion area swells very fast. °· The insertion area is bleeding, and the bleeding does not stop when you hold steady pressure on the area. °· Your arm or hand becomes pale, cool, tingly, or numb. °These symptoms may represent a serious problem that is an emergency. Do not wait to see if the symptoms will go away. Get medical help right away. Call your local emergency services (911 in the [place] Do not   drive yourself to the hospital. °Summary °· After the procedure, it is common to have bruising and tenderness at the site. °· Follow instructions from your health care provider about how to take care of your radial site wound. Check the wound every day for signs of infection. °· Do not lift anything that is heavier than 10 lb (4.5 kg), or the limit that you are told, until your health care provider says that it is safe. °This information is not intended to replace advice given to you by your health care provider. Make sure you discuss any questions you have with your health care  provider. °Document Released: 09/14/2010 Document Revised: 09/17/2017 Document Reviewed: 09/17/2017 °Elsevier Interactive Patient Education © 2019 [employer] ° °

## 2018-09-29 ENCOUNTER — Other Ambulatory Visit: Payer: Self-pay | Admitting: Physician Assistant

## 2018-09-29 ENCOUNTER — Ambulatory Visit (HOSPITAL_COMMUNITY)
Admission: RE | Admit: 2018-09-29 | Payer: Medicare HMO | Source: Ambulatory Visit | Admitting: Interventional Cardiology

## 2018-09-29 ENCOUNTER — Ambulatory Visit
Admission: RE | Admit: 2018-09-29 | Discharge: 2018-09-29 | Disposition: A | Discharge status: Home / Self Care | Payer: Medicare HMO | Source: Ambulatory Visit | Attending: Physician Assistant | Admitting: Physician Assistant

## 2018-09-29 ENCOUNTER — Encounter (HOSPITAL_COMMUNITY): Admission: RE | Payer: Self-pay | Source: Ambulatory Visit

## 2018-09-29 DIAGNOSIS — R053 Chronic cough: Secondary | ICD-10-CM

## 2018-09-29 DIAGNOSIS — R05 Cough: Secondary | ICD-10-CM

## 2018-09-29 SURGERY — LEFT HEART CATH AND CORONARY ANGIOGRAPHY
Anesthesia: LOCAL

## 2018-10-13 ENCOUNTER — Ambulatory Visit (INDEPENDENT_AMBULATORY_CARE_PROVIDER_SITE_OTHER): Payer: Medicare HMO | Admitting: Physician Assistant

## 2018-10-13 ENCOUNTER — Encounter: Payer: Self-pay | Admitting: Physician Assistant

## 2018-10-13 VITALS — BP 107/73 | HR 73 | Ht 68.0 in | Wt 203.0 lb

## 2018-10-13 DIAGNOSIS — I10 Essential (primary) hypertension: Secondary | ICD-10-CM

## 2018-10-13 DIAGNOSIS — E785 Hyperlipidemia, unspecified: Secondary | ICD-10-CM

## 2018-10-13 DIAGNOSIS — I2510 Atherosclerotic heart disease of native coronary artery without angina pectoris: Secondary | ICD-10-CM | POA: Diagnosis not present

## 2018-10-13 DIAGNOSIS — B20 Human immunodeficiency virus [HIV] disease: Secondary | ICD-10-CM

## 2018-10-13 DIAGNOSIS — I5042 Chronic combined systolic (congestive) and diastolic (congestive) heart failure: Secondary | ICD-10-CM

## 2018-10-13 NOTE — Patient Instructions (Addendum)
Medication Instructions:   Your physician recommends that you continue on your current medications as directed. Please refer to the Current Medication list given to you today.  If you need a refill on your cardiac medications before your next appointment, please call your pharmacy.   Lab work:  NONE  If you have labs (blood work) drawn today and your tests are completely normal, you will receive your results only by: [name] MyChart Message (if you have MyChart) OR . A paper copy in the mail If you have any lab test that is abnormal or we need to change your treatment, we will call you to review the results.  Testing/Procedures: NONE  Follow-Up: At [facility], you and your health needs are our priority.  As part of our continuing mission to provide you with exceptional heart care, we have created designated Provider Care Teams.  These Care Teams include your primary Cardiologist (physician) and Advanced Practice Providers (APPs -  Physician Assistants and Nurse Practitioners) who all work together to provide you with the care you need, when you need it. You will need a follow up appointment in 3-4 months.  Please call our office 2 months in advance to schedule this appointment.  You may see Lesleigh Noe, MD or one of the following Advanced Practice Providers on your designated Care Team:   Norma Fredrickson, NP Nada Boozer, NP . Georgie Chard, NP  Any Other Special Instructions Will Be Listed Below (If Applicable). Keep of the office informed of your surgery so that you are switched from Aspirin to Plavix

## 2018-10-13 NOTE — Progress Notes (Signed)
Cardiology Office Note    Date:  10/15/2018   ID:  Omar, Sandoval [birth year], MRN [id number]  PCP:  Eyvonne Mechanic, MD  Cardiologist:  Dr. Tamala Julian   Chief Complaint  Patient presents with  . Follow-up    seen for Dr. Tamala Julian    History of Present Illness:  Omar Sandoval is a 63 y.o. male with PMH ofHIV,HTN, HLD, PVC,chronic combined systolic and diastolic heart failure and CAD. He had DES to distal RCA in June 2018 for inferior STEMI,he also had 70% proximal to mid LAD residual and 60 to 70% distal left circumflex residual, EF was 40 to 50%. Echocardiogram obtained after the procedure on 02/06/2017 showed EF 45 to 50%, septal mid and basal inferior wall hypokinesis with mild LVH, mild MR. Patient was last seen by Dr. Tamala Julian on 07/08/2018 who recommended a outpatient sleep study. His Brilinta was stopped at that time.  Myoview obtained for chest pain on 09/21/2018 which revealed EF of 20%, global hypokinesia with paradoxical motion along the inferior wall, fixed defect involving the inferior wall consistent with the previous inferior MI, reversibility involving the apical segment of anterior wall suggestive of ischemia.  This stress test was reviewed by Dr. Tamala Julian his primary cardiologist who also recommended cardiac catheterization.    Patient eventually underwent cardiac catheterization on 09/25/2018 which revealed EF 35%, 30% in-stent restenosis of mid RCA, 70% distal left circumflex lesion, 80% distal OM lesion, 40 to 45% distal left main lesion.  The coronary anatomy is stable compared to previous cardiac catheterization.  Therefore it was recommended to consider medical therapy.  It was recommended for the patient to continue in a biopsy.  Once that is completed, he was started on the Plavix and discontinue aspirin.  He has been recovering well since his recent cardiac catheterization.  He denies any further chest discomfort.  So far he does not have a date for his upcoming anal biopsy yet.   Once he figure out the date, we can coordinate with infectious disease to switch his aspirin to Plavix after the surgery.  His last lipid panel obtained in November 2019 showed very well-controlled cholesterol.   Past Medical History:  Diagnosis Date  . HIV (human immunodeficiency virus infection) ([place])   . Immune deficiency disorder ([place])   . Inguinal hernia    Left  . Myocardial infarction [facility])    01-2017 angioplasty with stents    Past Surgical History:  Procedure Laterality Date  . COLONOSCOPY    . CORONARY ANGIOPLASTY    . CORONARY/GRAFT ACUTE MI REVASCULARIZATION N/A 02/03/2017   Procedure: Coronary/Graft Acute MI Revascularization;  Surgeon: Belva Crome, MD;  Location: [place] [facility];  Service: Cardiovascular;  Laterality: N/A;  . FRACTURE SURGERY     left ankle and right foot  . HERNIA REPAIR    . INGUINAL HERNIA REPAIR Left 08/27/2017   Procedure: LAPAROSCOPIC LEFT INGUINAL HERNIA REPAIR WITH MESH;  Surgeon: Ralene Ok, MD;  Location: [place];  Service: General;  Laterality: Left;  . INSERTION OF MESH Left 08/27/2017   Procedure: INSERTION OF MESH;  Surgeon: Ralene Ok, MD;  Location: [place];  Service: General;  Laterality: Left;  . LEFT HEART CATH AND CORONARY ANGIOGRAPHY N/A 02/03/2017   Procedure: Left Heart Cath and Coronary Angiography;  Surgeon: Belva Crome, MD;  Location: [place] [facility];  Service: Cardiovascular;  Laterality: N/A;  . LEFT HEART CATH AND CORONARY ANGIOGRAPHY N/A 09/25/2018   Procedure: LEFT HEART  CATH AND CORONARY ANGIOGRAPHY;  Surgeon: Belva Crome, MD;  Location: [place] [facility];  Service: Cardiovascular;  Laterality: N/A;  . OPEN REDUCTION INTERNAL FIXATION (ORIF) DISTAL RADIAL FRACTURE Right 07/20/2016   Procedure: OPEN REDUCTION INTERNAL FIXATION (ORIF) DISTAL RADIAL FRACTURE;  Surgeon: Iran Planas, MD;  Location: [place];  Service: Orthopedics;  Laterality: Right;  . SURGERY SCROTAL / TESTICULAR     nondecended testes     Current Medications: Outpatient Medications Prior to Visit  Medication Sig Dispense Refill  . aspirin 81 MG chewable tablet Chew 1 tablet (81 mg total) by mouth daily.    [name] atorvastatin (LIPITOR) 40 MG tablet TAKE 1 TABLET BY MOUTH ONCE DAILY AT  6PM. (Patient taking differently: Take 40 mg by mouth daily at 6 PM. ) 90 tablet 2  . Dolutegravir-lamiVUDine 50-300 MG TABS Take 1 tablet by mouth daily.     . metoprolol succinate (TOPROL-XL) 25 MG 24 hr tablet TAKE 1/2 (ONE-HALF) TABLET BY MOUTH TWICE DAILY (Patient taking differently: Take 12.5 mg by mouth 2 (two) times daily. ) 90 tablet 2  . nitroGLYCERIN (NITROSTAT) 0.4 MG SL tablet Place 1 tablet (0.4 mg total) under the tongue every 5 (five) minutes x 3 doses as needed for chest pain. 25 tablet 6  . oxyCODONE-acetaminophen (PERCOCET) 10-325 MG tablet Take 0.5-1 tablets by mouth every 4 (four) hours as needed for pain.    [name] NARCAN 4 MG/0.1ML LIQD nasal spray kit Place 1 spray into alternate nostrils as directed.  0   No facility-administered medications prior to visit.      Allergies:   Chocolate; Lac bovis; Lactose intolerance (gi); and Vicodin [hydrocodone-acetaminophen]   Social History   Socioeconomic History  . Marital status: Single    Spouse name: Not on file  . Number of children: Not on file  . Years of education: Not on file  . Highest education level: Not on file  Occupational History  . Not on file  Social Needs  . Financial resource strain: Not on file  . Food insecurity:    Worry: Not on file    Inability: Not on file  . Transportation needs:    Medical: Not on file    Non-medical: Not on file  Tobacco Use  . Smoking status: Former Smoker    Packs/day: 0.50    Years: 45.00    Pack years: 22.50    Types: Cigarettes    Last attempt to quit: 2013    Years since quitting: 7.1  . Smokeless tobacco: Never Used  Substance and Sexual Activity  . Alcohol use: Yes    Alcohol/week: 3.0 standard drinks    Types:  3 Cans of beer per week    Comment: beer daily  . Drug use: Yes    Types: Marijuana    Comment: used  Marijuana  . Sexual activity: Not on file  Lifestyle  . Physical activity:    Days per week: Not on file    Minutes per session: Not on file  . Stress: Not on file  Relationships  . Social connections:    Talks on phone: Not on file    Gets together: Not on file    Attends religious service: Not on file    Active member of club or organization: Not on file    Attends meetings of clubs or organizations: Not on file    Relationship status: Not on file  Other Topics Concern  . Not on file  Social  History Narrative  . Not on file     Family History:  The patient's family history includes Cancer in his brother and mother.   ROS:   Please see the history of present illness.    ROS All other systems reviewed and are negative.   PHYSICAL EXAM:   VS:  BP 107/73   Pulse 73   Ht '5\' 8"'$  (1.727 m)   Wt 203 lb (92.1 kg)   BMI 30.87 kg/m    GEN: Well nourished, well developed, in no acute distress  HEENT: normal  Neck: no JVD, carotid bruits, or masses Cardiac: RRR; no murmurs, rubs, or gallops,no edema  Respiratory:  clear to auscultation bilaterally, normal work of breathing GI: soft, nontender, nondistended, + BS MS: no deformity or atrophy  Skin: warm and dry, no rash Neuro:  Alert and Oriented x 3, Strength and sensation are intact Psych: euthymic mood, full affect  Wt Readings from Last 3 Encounters:  10/13/18 203 lb (92.1 kg)  09/25/18 209 lb (94.8 kg)  09/22/18 209 lb (94.8 kg)      Studies/Labs Reviewed:   EKG:  EKG is not ordered today.    Recent Labs: 09/18/2018: ALT 20; BUN 8; Creatinine, Ser 0.88; Potassium 4.4; Sodium 144; TSH 0.995   Lipid Panel    Component Value Date/Time   CHOL 126 07/08/2018 0901   TRIG 43 07/08/2018 0901   HDL 46 07/08/2018 0901   CHOLHDL 2.7 07/08/2018 0901   CHOLHDL 3.3 02/03/2017 1323   VLDL 8 02/03/2017 1323   LDLCALC 71  07/08/2018 0901    Additional studies/ records that were reviewed today include:   Cath 09/25/2018  Moderately severe coronary artery disease with stable anatomy since acute intervention in June 2018.  Distal left main is calcified and is 40 to 50% narrowed.  Proximal to mid LAD within a tortuous segment after the first diagonal.  Lesion is calcified and angulated.  Widely patent ramus  Circumflex contains distal 70% and distal obtuse marginal 80% stenosis.  There is 30% in-stent restenosis in the mid right coronary.  Otherwise, widely patent.  LV function is decreased with akinesis of the inferobasal wall.  EF is estimated to be 35%.  LVEDP is upper normal 16 to 18 mmHg.  RECOMMENDATIONS:   There is mild ischemia in the anterior wall distribution, anatomy is stable compared to a year and a half ago, and symptoms are chronic and stable.  Plan to continue medical therapy.  Intensify secondary risk modification.  Increase statin therapy to maximal intensity.  Unstable symptoms or progressive angina would lead to consideration of CABG versus high risk PCI.  PCI risk would be high due to distal left main through which devices/stents may not track given the angulation of the LAD off the left main and the presence of calcium.  Once anal biopsy is performed, will start Plavix and discontinue aspirin.  Plavix can be paused if further surgical treatment is needed.    ASSESSMENT:    1. Coronary artery disease involving native coronary artery of native heart without angina pectoris   2. Essential hypertension   3. Hyperlipidemia LDL goal <70   4. HIV infection, unspecified symptom status ([place])   5. Chronic combined systolic and diastolic CHF (congestive heart failure) (H[facility])      PLAN:  In order of problems listed above:  1. CAD: Recent cardiac catheterization is reassuring, he continued to have some small vessel disease but no significant disease in major coronary vessel.  He is  cleared to proceed with anal biopsy at any time.  Once he figures out the date and the time for the anal biopsy, we plan to switch him from aspirin to Plavix after the surgery.  He will need to let us know once he completed the anal biopsy so we can send in the prescription for the Plavix  2. Hypertension: Blood pressure stable  3. Hyperlipidemia: Continue Lipitor 40 mg daily.  Last lipid panel obtained 3 months ago showed very well-controlled cholesterol  4. HIV: Followed by [facility] infectious disease, Dr. Janalyn Harder  5. Chronic combined systolic and diastolic heart failure: Euvolemic on physical exam.    Medication Adjustments/Labs and Tests Ordered: Current medicines are reviewed at length with the patient today.  Concerns regarding medicines are outlined above.  Medication changes, Labs and Tests ordered today are listed in the Patient Instructions below. Patient Instructions  Medication Instructions:   Your physician recommends that you continue on your current medications as directed. Please refer to the Current Medication list given to you today.  If you need a refill on your cardiac medications before your next appointment, please call your pharmacy.   Lab work:  NONE  If you have labs (blood work) drawn today and your tests are completely normal, you will receive your results only by: [name] MyChart Message (if you have MyChart) OR . A paper copy in the mail If you have any lab test that is abnormal or we need to change your treatment, we will call you to review the results.  Testing/Procedures: NONE  Follow-Up: At [facility], you and your health needs are our priority.  As part of our continuing mission to provide you with exceptional heart care, we have created designated Provider Care Teams.  These Care Teams include your primary Cardiologist (physician) and Advanced Practice Providers (APPs -  Physician Assistants and Nurse Practitioners) who all work together to  provide you with the care you need, when you need it. You will need a follow up appointment in 3-4 months.  Please call our office 2 months in advance to schedule this appointment.  You may see Sinclair Grooms, MD or one of the following Advanced Practice Providers on your designated Care Team:   Truitt Merle, NP Cecilie Kicks, NP . Kathyrn Drown, NP  Any Other Special Instructions Will Be Listed Below (If Applicable). Keep of the office informed of your surgery so that you are switched from Aspirin to Plavix       Signed, Almyra Deforest, [place]  10/15/2018 11:15 PM    [address], [place], [place]  [address] Phone: [phone]; Fax: [phone]

## 2018-10-15 ENCOUNTER — Encounter: Payer: Self-pay | Admitting: Physician Assistant

## 2018-10-25 ENCOUNTER — Ambulatory Visit (HOSPITAL_BASED_OUTPATIENT_CLINIC_OR_DEPARTMENT_OTHER): Payer: Medicare HMO | Attending: Interventional Cardiology | Admitting: Cardiology

## 2018-10-25 VITALS — Ht 68.0 in | Wt 203.0 lb

## 2018-10-25 DIAGNOSIS — R0600 Dyspnea, unspecified: Secondary | ICD-10-CM | POA: Diagnosis present

## 2018-10-25 DIAGNOSIS — G4733 Obstructive sleep apnea (adult) (pediatric): Secondary | ICD-10-CM | POA: Insufficient documentation

## 2018-10-26 NOTE — Procedures (Signed)
Patient Name: Omar Sandoval, Omar Sandoval Date: 10/25/2018 Gender: Male D.O.B: 03-Mar-1956 Age (years): 63 Referring Provider: Daneen Schick Height (inches): 12 Interpreting Physician: Fransico Him MD, ABSM Weight (lbs): 203 RPSGT: Rebekah [place]field BMI: 31 MRN: [id number] Neck Size: 16.00  CLINICAL INFORMATION Sleep Study Type: Split Night CPAP  Indication for sleep study: Fatigue  Epworth Sleepiness Score: 9  SLEEP STUDY TECHNIQUE As per the AASM Manual for the Scoring of Sleep and Associated Events v2.3 (April 2016) with a hypopnea requiring 4% desaturations.  The channels recorded and monitored were frontal, central and occipital EEG, electrooculogram (EOG), submentalis EMG (chin), nasal and oral airflow, thoracic and abdominal wall motion, anterior tibialis EMG, snore microphone, electrocardiogram, and pulse oximetry. Continuous positive airway pressure (CPAP) was initiated when the patient met split night criteria and was titrated according to treat sleep-disordered breathing.  MEDICATIONS Medications self-administered by patient taken the night of the study : N/A  RESPIRATORY PARAMETERS Diagnostic Total AHI (/hr): 34.8  RDI (/hr):37.7  OA Index (/hr): 4.8  CA Index (/hr): 0.0 REM AHI (/hr): 85.7  NREM AHI (/hr):31.3  Supine AHI (/hr):34.8  Non-supine AHI (/hr): 0 Min O2 Sat (%):80.0  Mean O2 (%): 95.0  Time below 88% (min):1.3   Titration Optimal Pressure (cm):14  AHI at Optimal Pressure (/hr):0.0  Min O2 at Optimal Pressure (%):94.0 Supine % at Optimal (%):57  Sleep % at Optimal (%):96   SLEEP ARCHITECTURE The recording time for the entire night was 403.9 minutes.  During a baseline period of 167.4 minutes, the patient slept for 163.9 minutes in REM and nonREM, yielding a sleep efficiency of 97.9%. Sleep onset after lights out was 0.0 minutes with a REM latency of 67.5 minutes. The patient spent 7.0% of the night in stage N1 sleep, 86.6% in stage N2 sleep, 0.0% in  stage N3 and 6.4% in REM.  During the titration period of 226.8 minutes, the patient slept for 209.3 minutes in REM and nonREM, yielding a sleep efficiency of 92.3%. Sleep onset after CPAP initiation was 0.0 minutes with a REM latency of 14.0 minutes. The patient spent 10.2% of the night in stage N1 sleep, 72.6% in stage N2 sleep, 0.0% in stage N3 and 17.2% in REM.  CARDIAC DATA The 2 lead EKG demonstrated sinus rhythm. The mean heart rate was 100.0 beats per minute. Other EKG findings include: PVCs.  LEG MOVEMENT DATA The total Periodic Limb Movements of Sleep (PLMS) were 0. The PLMS index was 0.0 .  IMPRESSIONS - Severe obstructive sleep apnea occurred during the diagnostic portion of the study (AHI = 34.8/hour). An optimal PAP pressure was selected for this patient ( 14 cm of water) - No significant central sleep apnea occurred during the diagnostic portion of the study (CAI = 0.0/hour). - The patient had minimal or no oxygen desaturation during the diagnostic portion of the study (Min O2 = 80.0%) - The patient snored with moderate snoring volume during the diagnostic portion of the study. - EKG findings include PVCs. - Clinically significant periodic limb movements did not occur during sleep.  DIAGNOSIS - Obstructive Sleep Apnea (327.23 [G47.33 ICD-10])  RECOMMENDATIONS - Trial of CPAP therapy on 14 cm H2O with a Medium size Fisher&Paykel Full Face Mask Simplus mask and heated humidification. - Avoid alcohol, sedatives and other CNS depressants that may worsen sleep apnea and disrupt normal sleep architecture. - Sleep hygiene should be reviewed to assess factors that may improve sleep quality. - Weight management and regular exercise should be initiated or  continued. - Return to [facility] Center for re-evaluation after 10 weeks of therapy  [Electronically signed] 10/26/2018 05:54 PM  Fransico Him MD, ABSM Diplomate, American Board of Sleep Medicine

## 2018-10-27 ENCOUNTER — Telehealth: Payer: Self-pay | Admitting: *Deleted

## 2018-10-27 NOTE — Telephone Encounter (Signed)
-----   Message from Quintella Reichert, MD sent at 10/26/2018  5:57 PM EST ----- Please let patient know that they have significant sleep apnea and had successful PAP titration and will be set up with PAP unit.  Please let DME know that order is in EPIC.  Please set patient up for OV in 10 weeks

## 2018-10-27 NOTE — Telephone Encounter (Signed)
Informed patient of sleep study results and patient understanding was verbalized. Patient understands his sleep study showed  they have significant sleep apnea and had successful PAP titration and will be set up with PAP unit. Order is in EPIC. Please set patient up for OV in 10 weeks.  Upon patient request DME selection is [facility]. Patient understands he will be contacted by [facility] to set up his cpap. Patient understands to call if [facility] does not contact him with new setup in a timely manner. Patient understands they will be called once confirmation has been received from [facility] that they have received their new machine to schedule 10 week follow up appointment.  [facility] notified of new cpap order  Please add to airview Patient was grateful for the call and thanked me.

## 2018-11-16 NOTE — Progress Notes (Signed)
Patient asked what can he do to get his RBC where they need to be   The patient has been notified of the result and verbalized understanding.  All questions (if any) were answered. Dorris Fetch, CMA 11/16/2018 11:50 AM

## 2018-11-24 NOTE — Telephone Encounter (Signed)
DME changed to [facility]. 

## 2018-12-24 ENCOUNTER — Other Ambulatory Visit: Payer: Self-pay | Admitting: Interventional Cardiology

## 2019-01-14 ENCOUNTER — Telehealth: Payer: Self-pay

## 2019-01-14 NOTE — Telephone Encounter (Signed)
I called pt to switch his appt from OV to Virtual. He refused to do a Virtual and stated that if he couldn't come in the office he would find another provider that would see him. I offered to reschedule but he hung up on me.

## 2019-01-20 ENCOUNTER — Ambulatory Visit: Payer: Medicare HMO | Admitting: Podiatry

## 2019-01-27 ENCOUNTER — Ambulatory Visit: Payer: Medicare HMO | Admitting: Interventional Cardiology

## 2019-02-05 ENCOUNTER — Telehealth: Payer: Self-pay

## 2019-02-05 NOTE — Telephone Encounter (Signed)
Virtual Visit Pre-Appointment Phone Call TELEPHONE CALL NOTE  Omar Sandoval has been deemed a candidate for a follow-up tele-health visit to limit community exposure during the Covid-19 pandemic. I spoke with the patient via phone to ensure availability of phone/video source, confirm preferred email & phone number, and discuss instructions and expectations.  I reminded Omar Sandoval to be prepared with any vital sign and/or heart rhythm information that could potentially be obtained via home monitoring, at the time of his visit. I reminded Omar Sandoval to expect a phone call prior to his visit.  Patient agrees to consent below.  Cleon Gustin, RN 02/05/2019 3:03 PM   FULL LENGTH CONSENT FOR TELE-HEALTH VISIT   I hereby voluntarily request, consent and authorize [facility] and its employed or contracted physicians, physician assistants, nurse practitioners or other licensed health care professionals (the Practitioner), to provide me with telemedicine health care services (the "Services") as deemed necessary by the treating Practitioner. I acknowledge and consent to receive the Services by the Practitioner via telemedicine. I understand that the telemedicine visit will involve communicating with the Practitioner through live audiovisual communication technology and the disclosure of certain medical information by electronic transmission. I acknowledge that I have been given the opportunity to request an in-person assessment or other available alternative prior to the telemedicine visit and am voluntarily participating in the telemedicine visit.  I understand that I have the right to withhold or withdraw my consent to the use of telemedicine in the course of my care at any time, without affecting my right to future care or treatment, and that the Practitioner or I may terminate the telemedicine visit at any time. I understand that I have the right to inspect all information obtained and/or  recorded in the course of the telemedicine visit and may receive copies of available information for a reasonable fee.  I understand that some of the potential risks of receiving the Services via telemedicine include:  [name] Delay or interruption in medical evaluation due to technological equipment failure or disruption; . Information transmitted may not be sufficient (e.g. poor resolution of images) to allow for appropriate medical decision making by the Practitioner; and/or  . In rare instances, security protocols could fail, causing a breach of personal health information.  Furthermore, I acknowledge that it is my responsibility to provide information about my medical history, conditions and care that is complete and accurate to the best of my ability. I acknowledge that Practitioner's advice, recommendations, and/or decision may be based on factors not within their control, such as incomplete or inaccurate data provided by me or distortions of diagnostic images or specimens that may result from electronic transmissions. I understand that the practice of medicine is not an exact science and that Practitioner makes no warranties or guarantees regarding treatment outcomes. I acknowledge that I will receive a copy of this consent concurrently upon execution via email to the email address I last provided but may also request a printed copy by calling the office of [place].    I understand that my insurance will be billed for this visit.   I have read or had this consent read to me. . I understand the contents of this consent, which adequately explains the benefits and risks of the Services being provided via telemedicine.  . I have been provided ample opportunity to ask questions regarding this consent and the Services and have had my questions answered to my satisfaction. . I give  my informed consent for the services to be provided through the use of telemedicine in my medical care  By participating  in this telemedicine visit I agree to the above.

## 2019-02-08 DIAGNOSIS — I255 Ischemic cardiomyopathy: Secondary | ICD-10-CM | POA: Insufficient documentation

## 2019-02-08 DIAGNOSIS — I10 Essential (primary) hypertension: Secondary | ICD-10-CM | POA: Insufficient documentation

## 2019-02-08 DIAGNOSIS — G4733 Obstructive sleep apnea (adult) (pediatric): Secondary | ICD-10-CM | POA: Insufficient documentation

## 2019-02-08 DIAGNOSIS — Z9989 Dependence on other enabling machines and devices: Secondary | ICD-10-CM | POA: Insufficient documentation

## 2019-02-08 NOTE — Progress Notes (Signed)
Virtual Visit via Video Note   This visit type was conducted due to national recommendations for restrictions regarding the COVID-19 Pandemic (e.g. social distancing) in an effort to limit this patient's exposure and mitigate transmission in our community.  Due to his co-morbid illnesses, this patient is at least at moderate risk for complications without adequate follow up.  This format is felt to be most appropriate for this patient at this time.  All issues noted in this document were discussed and addressed.  A limited physical exam was performed with this format.  Please refer to the patient's chart for his consent to telehealth for [facility].   Date:  02/09/2019   ID:  Omar Goldsaul R Szczesniak, DOB 11/03/1955, MRN [id number]  Patient Location: Home Provider Location: Home  PCP:  Bartholomew BoardsBarroso, Luis, MD  Cardiologist:  Lesleigh NoeHenry W Smith III, MD  Electrophysiologist:  None   Evaluation Performed:  Follow-Up Visit  Chief Complaint:  Follow up  History of Present Illness:    Omar Sandoval is a 63 y.o. male with with history of HIV, hypertension, HLD, PVCs, chronic combined systolic and diastolic CHF.  Also has CAD status post inferior STEMI 01/2017 treated with DES to the RCA with residual 70% mid LAD and 60 to 70% distal circumflex EF 40 to 50% at that time.  Echo after cath 02/06/2017 EF 45 to 50%.    Recurrent chest pain 09/21/2018 with abnormal Myoview EF 20% with global hypokinesis and paradoxical motion along the inferior wall with fixed defect involving the inferior wall consistent with previous MI.  There was reversibility involving the apical segment of the anterior wall.  Catheterization 09/25/2018 EF 35%, 30% in-stent restenosis of the mid RCA, 70% distal circumflex, 80% distal OM, 40 to 45% distal left main.  Was felt to be stable compared to previous cardiac cath and medical therapy recommended.  He has since had a sleep apnea study and had severe sleep apnea CPAP recommended but patient won't  use.  He last saw a provider in our office 09/2018 at which time he was planning to have an anal biopsy and after that was supposed to be given a prescription for Plavix and stop ASA.  Patient complains of 3-5 days of muscle ache-like a bruise in his chest-has 2 pit bulls that pull on the leash. Denies chest tightness, shortness of breath, dizziness or presyncope. Hasn't been able to check his BP. Patient denies that he has sleep apnea. Will not use CPAP.       The patient does not have symptoms concerning for COVID-19 infection (fever, chills, cough, or new shortness of breath).    Past Medical History:  Diagnosis Date   HIV (human immunodeficiency virus infection) (HCC)    Immune deficiency disorder (HCC)    Inguinal hernia    Left   Myocardial infarction [facility](HCC)    01-2017 angioplasty with stents   Past Surgical History:  Procedure Laterality Date   COLONOSCOPY     CORONARY ANGIOPLASTY     CORONARY/GRAFT ACUTE MI REVASCULARIZATION N/A 02/03/2017   Procedure: Coronary/Graft Acute MI Revascularization;  Surgeon: Lyn RecordsSmith, Henry W, MD;  [facility];  Service: Cardiovascular;  Laterality: N/A;   FRACTURE SURGERY     left ankle and right foot   HERNIA REPAIR     INGUINAL HERNIA REPAIR Left 08/27/2017   Procedure: LAPAROSCOPIC LEFT INGUINAL HERNIA REPAIR WITH MESH;  Surgeon: Axel Filleramirez, Armando, MD;  [facility];  Service: General;  Laterality: Left;   INSERTION OF MESH Left 08/27/2017   Procedure: INSERTION OF MESH;  Surgeon: Axel Filleramirez, Armando, MD;  [facility];  Service: General;  Laterality: Left;   LEFT HEART CATH AND CORONARY ANGIOGRAPHY N/A 02/03/2017   Procedure: Left Heart Cath and Coronary Angiography;  Surgeon: Lyn RecordsSmith, Henry W, MD;  [facility];  Service: Cardiovascular;  Laterality: N/A;   LEFT HEART CATH AND CORONARY ANGIOGRAPHY N/A 09/25/2018   Procedure: LEFT HEART CATH AND CORONARY ANGIOGRAPHY;  Surgeon: Lyn RecordsSmith, Henry W, MD;   [facility];  Service: Cardiovascular;  Laterality: N/A;   OPEN REDUCTION INTERNAL FIXATION (ORIF) DISTAL RADIAL FRACTURE Right 07/20/2016   Procedure: OPEN REDUCTION INTERNAL FIXATION (ORIF) DISTAL RADIAL FRACTURE;  Surgeon: Bradly BienenstockFred Ortmann, MD;  Location: [facility] OR;  Service: Orthopedics;  Laterality: Right;   SURGERY SCROTAL / TESTICULAR     nondecended testes     Current Meds  Medication Sig   aspirin 81 MG chewable tablet Chew 1 tablet (81 mg total) by mouth daily.   Dolutegravir-lamiVUDine 50-300 MG TABS Take 1 tablet by mouth daily.    metoprolol succinate (TOPROL-XL) 25 MG 24 hr tablet TAKE 1/2 (ONE-HALF) TABLET BY MOUTH TWICE DAILY (Patient taking differently: Take 12.5 mg by mouth 2 (two) times daily. )   nitroGLYCERIN (NITROSTAT) 0.4 MG SL tablet Place 1 tablet (0.4 mg total) under the tongue every 5 (five) minutes x 3 doses as needed for chest pain.   oxyCODONE-acetaminophen (PERCOCET) 10-325 MG tablet Take 0.5-1 tablets by mouth every 4 (four) hours as needed for pain.     Allergies:   Chocolate, Lac bovis, Lactose intolerance (gi), and Vicodin [hydrocodone-acetaminophen]   Social History   Tobacco Use   Smoking status: Former Smoker    Packs/day: 0.50    Years: 45.00    Pack years: 22.50    Types: Cigarettes    Quit date: 2013    Years since quitting: 7.4   Smokeless tobacco: Never Used  Substance Use Topics   Alcohol use: Yes    Alcohol/week: 3.0 standard drinks    Types: 3 Cans of beer per week    Comment: beer daily   Drug use: Yes    Types: Marijuana    Comment: used  Marijuana     Family Hx: The patient's family history includes Cancer in his brother and mother.  ROS:   Please see the history of present illness.     All other systems reviewed and are negative.   Prior CV studies:   The following studies were reviewed today:  Cath 09/25/2018  Moderately severe coronary artery disease with stable anatomy since acute  intervention in June 2018.  Distal left main is calcified and is 40 to 50% narrowed.  Proximal to mid LAD within a tortuous segment after the first diagonal.  Lesion is calcified and angulated.  Widely patent ramus  Circumflex contains distal 70% and distal obtuse marginal 80% stenosis.  There is 30% in-stent restenosis in the mid right coronary.  Otherwise, widely patent.  LV function is decreased with akinesis of the inferobasal wall.  EF is estimated to be 35%.  LVEDP is upper normal 16 to 18 mmHg.   RECOMMENDATIONS:    There is mild ischemia in the anterior wall distribution, anatomy is stable compared to a year and a half ago, and symptoms are chronic and stable.  Plan to continue medical therapy.  Intensify secondary risk modification.  Increase statin therapy to maximal  intensity.  Unstable symptoms or progressive angina would lead to consideration of CABG versus high risk PCI.  PCI risk would be high due to distal left main through which devices/stents may not track given the angulation of the LAD off the left main and the presence of calcium.  Once anal biopsy is performed, will start Plavix and discontinue aspirin.  Plavix can be paused if further surgical treatment is needed.     IMPRESSIONS - Severe obstructive sleep apnea occurred during the diagnostic portion of the study (AHI = 34.8/hour). An optimal PAP pressure was selected for this patient ( 14 cm of water) - No significant central sleep apnea occurred during the diagnostic portion of the study (CAI = 0.0/hour). - The patient had minimal or no oxygen desaturation during the diagnostic portion of the study (Min O2 = 80.0%) - The patient snored with moderate snoring volume during the diagnostic portion of the study. - EKG findings include PVCs. - Clinically significant periodic limb movements did not occur during sleep.   DIAGNOSIS - Obstructive Sleep Apnea (327.23 [G47.33 ICD-10])   RECOMMENDATIONS - Trial of  CPAP therapy on 14 cm H2O with a Medium size Fisher&Paykel Full Face Mask Simplus mask and heated humidification. - Avoid alcohol, sedatives and other CNS depressants that may worsen sleep apnea and disrupt normal sleep architecture. - Sleep hygiene should be reviewed to assess factors that may improve sleep quality. - Weight management and regular exercise should be initiated or continued. - Return to [facility] Center for re-evaluation after 10 weeks of therapy   [Electronically signed] 10/26/2018 05:54 PM   Fransico Him MD, ABSM Diplomate, American Board of Sleep Medicine   Labs/Other Tests and Data Reviewed:    EKG:  No ECG reviewed.  Recent Labs: 09/18/2018: ALT 20; BUN 8; Creatinine, Ser 0.88; Potassium 4.4; Sodium 144; TSH 0.995 09/22/2018: Hemoglobin 13.2; Platelets 302   Recent Lipid Panel Lab Results  Component Value Date/Time   CHOL 126 07/08/2018 09:01 AM   TRIG 43 07/08/2018 09:01 AM   HDL 46 07/08/2018 09:01 AM   CHOLHDL 2.7 07/08/2018 09:01 AM   CHOLHDL 3.3 02/03/2017 01:23 PM   LDLCALC 71 07/08/2018 09:01 AM    Wt Readings from Last 3 Encounters:  02/09/19 195 lb (88.5 kg)  10/25/18 203 lb (92.1 kg)  10/13/18 203 lb (92.1 kg)     Objective:    Vital Signs:  Ht 5\' 8"  (1.727 m)    Wt 195 lb (88.5 kg)    BMI 29.65 kg/m    VITAL SIGNS:  reviewed GEN:  no acute distress RESPIRATORY:  normal respiratory effort, symmetric expansion CARDIOVASCULAR:  no peripheral edema  ASSESSMENT & PLAN:     CAD  status post inferior STEMI 01/2017 treated with DES to the RCA with residual 70% mid LAD and 60 to 70% distal circumflex EF 40 to 50%.  Recurrent chest pain 09/21/2018 with abnormal Myoview EF 20% with global hypokinesis and paradoxical motion along the inferior wall with fixed defect involving the inferior wall consistent with previous MI.  There was reversibility involving the apical segment of the anterior wall.  Catheterization 09/25/2018 EF 35%, 30% in-stent restenosis of  the mid RCA, 70% distal circumflex, 80% distal OM, 40 to 45% distal left main.  Was felt to be stable compared to previous cardiac cath and medical therapy recommended. Will start ASA and stop Plavix per Dr. Tamala Julian.   Ischemic cardiomyopathy ejection fraction 20% on Myoview, 35% at cath 09/5850  Chronic systolic  and diastolic CHF-compensated.  Essential hypertension-BP stable at recent   Hyperlipidemia on Lipitor 40 mg daily LDL 71-06/2018  OSA-severe- just diagnosed-patient denies he has sleep apnea. Refuses to  CPAP-discussed potential side effects of not using CPAP but patient says "we are trying to milk his medicaid since he now has insurance."   COVID-19 Education: The signs and symptoms of COVID-19 were discussed with the patient and how to seek care for testing (follow up with PCP or arrange E-visit).   The importance of social distancing was discussed today.  Time:   Today, I have spent 18 minutes with the patient with telehealth technology discussing the above problems.     Medication Adjustments/Labs and Tests Ordered: Current medicines are reviewed at length with the patient today.  Concerns regarding medicines are outlined above.   Tests Ordered: No orders of the defined types were placed in this encounter.   Medication Changes: No orders of the defined types were placed in this encounter.   Follow Up:  In Person in 2 month(s) Dr. Katrinka BlazingSmith  Signed, Jacolyn ReedyMichele Athina Fahey, PA-C  02/09/2019 7:44 AM    [facility]

## 2019-02-09 ENCOUNTER — Telehealth (INDEPENDENT_AMBULATORY_CARE_PROVIDER_SITE_OTHER): Payer: Medicare HMO | Admitting: Physician Assistant

## 2019-02-09 ENCOUNTER — Other Ambulatory Visit: Payer: Self-pay

## 2019-02-09 ENCOUNTER — Encounter: Payer: Self-pay | Admitting: Physician Assistant

## 2019-02-09 VITALS — Ht 68.0 in | Wt 195.0 lb

## 2019-02-09 DIAGNOSIS — I25119 Atherosclerotic heart disease of native coronary artery with unspecified angina pectoris: Secondary | ICD-10-CM

## 2019-02-09 DIAGNOSIS — E785 Hyperlipidemia, unspecified: Secondary | ICD-10-CM

## 2019-02-09 DIAGNOSIS — I10 Essential (primary) hypertension: Secondary | ICD-10-CM

## 2019-02-09 DIAGNOSIS — G4733 Obstructive sleep apnea (adult) (pediatric): Secondary | ICD-10-CM

## 2019-02-09 DIAGNOSIS — I5042 Chronic combined systolic (congestive) and diastolic (congestive) heart failure: Secondary | ICD-10-CM

## 2019-02-09 DIAGNOSIS — I255 Ischemic cardiomyopathy: Secondary | ICD-10-CM

## 2019-02-09 MED ORDER — CLOPIDOGREL BISULFATE 75 MG PO TABS: 75.0000 mg | ORAL_TABLET | Freq: Every day | ORAL | 3 refills | Status: DC

## 2019-02-09 NOTE — Patient Instructions (Addendum)
Medication Instructions:  Your physician has recommended you make the following change in your medication:   1. STOP: Aspirin  2. START: clopidogrel (plavix) 75 mg tablet: Take 1 tablet by mouth once a day  Lab work: None Ordered  If you have labs (blood work) drawn today and your tests are completely normal, you will receive your results only by: [name] MyChart Message (if you have MyChart) OR . A paper copy in the mail If you have any lab test that is abnormal or we need to change your treatment, we will call you to review the results.  Testing/Procedures: None ordered  Follow-Up: Follow up with Dr. Tamala Julian on 04/23/19 at 11:40 AM  Any Other Special Instructions Will Be Listed Below (If Applicable).

## 2019-02-09 NOTE — Progress Notes (Signed)
Please forward to Dr. Tamala Julian to be aware of refusal for CPAP

## 2019-04-02 ENCOUNTER — Encounter (HOSPITAL_COMMUNITY): Payer: Self-pay | Admitting: Emergency Medicine

## 2019-04-02 ENCOUNTER — Other Ambulatory Visit: Payer: Self-pay

## 2019-04-02 ENCOUNTER — Emergency Department (HOSPITAL_COMMUNITY)
Admission: EM | Admit: 2019-04-02 | Discharge: 2019-04-02 | Disposition: A | Discharge status: Home / Self Care | Payer: Medicare HMO | Attending: Emergency Medicine | Admitting: Emergency Medicine

## 2019-04-02 DIAGNOSIS — Z79899 Other long term (current) drug therapy: Secondary | ICD-10-CM | POA: Diagnosis not present

## 2019-04-02 DIAGNOSIS — Z7902 Long term (current) use of antithrombotics/antiplatelets: Secondary | ICD-10-CM | POA: Diagnosis not present

## 2019-04-02 DIAGNOSIS — Z87891 Personal history of nicotine dependence: Secondary | ICD-10-CM | POA: Diagnosis not present

## 2019-04-02 DIAGNOSIS — Y929 Unspecified place or not applicable: Secondary | ICD-10-CM | POA: Diagnosis not present

## 2019-04-02 DIAGNOSIS — I259 Chronic ischemic heart disease, unspecified: Secondary | ICD-10-CM | POA: Insufficient documentation

## 2019-04-02 DIAGNOSIS — Y9301 Activity, walking, marching and hiking: Secondary | ICD-10-CM | POA: Insufficient documentation

## 2019-04-02 DIAGNOSIS — I10 Essential (primary) hypertension: Secondary | ICD-10-CM | POA: Diagnosis not present

## 2019-04-02 DIAGNOSIS — S8982XA Other specified injuries of left lower leg, initial encounter: Secondary | ICD-10-CM | POA: Diagnosis not present

## 2019-04-02 DIAGNOSIS — S8992XA Unspecified injury of left lower leg, initial encounter: Secondary | ICD-10-CM | POA: Diagnosis present

## 2019-04-02 DIAGNOSIS — Y999 Unspecified external cause status: Secondary | ICD-10-CM | POA: Diagnosis not present

## 2019-04-02 DIAGNOSIS — W010XXA Fall on same level from slipping, tripping and stumbling without subsequent striking against object, initial encounter: Secondary | ICD-10-CM | POA: Diagnosis not present

## 2019-04-02 NOTE — ED Provider Notes (Signed)
[facility] EMERGENCY DEPARTMENT Provider Note   CSN: [id number] Arrival date & time: 04/02/19  1056    History   Chief Complaint Chief Complaint  Patient presents with   Fall    HPI Omar Sandoval is a 63 y.o. male who presents emergency department chief complaint of left leg injury.  Patient had a mechanical fall today tripping over an upper raised place on his flooring.  He states that he slipped forward and flexed his left leg underneath him.  He complains of severe pain with ambulation and predominately complains of pain in the middle posterior hamstring area.  His pain is worse with full extension and flexion of the knee.  He denies hitting his head.  He denies syncope or loss of consciousness.  The patient denies knee ankle or hip pain.     HPI  Past Medical History:  Diagnosis Date   HIV (human immunodeficiency virus infection) ([place])    Immune deficiency disorder ([place])    Inguinal hernia    Left   Myocardial infarction [facility])    01-2017 angioplasty with stents    Patient Active Problem List   Diagnosis Date Noted   Essential hypertension 02/08/2019   OSA on CPAP 02/08/2019   Cardiomyopathy, ischemic 02/08/2019   Left inguinal hernia 07/14/2017   Chronic combined systolic and diastolic heart failure ([place]) 02/28/2017   Hyperlipidemia 02/06/2017   Coronary artery disease involving native coronary artery of native heart with angina pectoris ([place]) 02/06/2017   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 02/2017 02/03/2017   HIV disease ([place]) 01/06/2013   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    Past Surgical History:  Procedure Laterality Date   COLONOSCOPY     CORONARY ANGIOPLASTY     CORONARY/GRAFT ACUTE MI REVASCULARIZATION N/A 02/03/2017   Procedure: Coronary/Graft Acute MI Revascularization;  Surgeon: Belva Crome, MD;  Location: [place] CV LAB;  Service: Cardiovascular;  Laterality: N/A;   FRACTURE SURGERY     left ankle and right foot    HERNIA REPAIR     INGUINAL HERNIA REPAIR Left 08/27/2017   Procedure: LAPAROSCOPIC LEFT INGUINAL HERNIA REPAIR WITH MESH;  Surgeon: Ralene Ok, MD;  Location: [place];  Service: General;  Laterality: Left;   INSERTION OF MESH Left 08/27/2017   Procedure: INSERTION OF MESH;  Surgeon: Ralene Ok, MD;  Location: [place];  Service: General;  Laterality: Left;   LEFT HEART CATH AND CORONARY ANGIOGRAPHY N/A 02/03/2017   Procedure: Left Heart Cath and Coronary Angiography;  Surgeon: Belva Crome, MD;  Location: [place] CV LAB;  Service: Cardiovascular;  Laterality: N/A;   LEFT HEART CATH AND CORONARY ANGIOGRAPHY N/A 09/25/2018   Procedure: LEFT HEART CATH AND CORONARY ANGIOGRAPHY;  Surgeon: Belva Crome, MD;  Location: [place] CV LAB;  Service: Cardiovascular;  Laterality: N/A;   OPEN REDUCTION INTERNAL FIXATION (ORIF) DISTAL RADIAL FRACTURE Right 07/20/2016   Procedure: OPEN REDUCTION INTERNAL FIXATION (ORIF) DISTAL RADIAL FRACTURE;  Surgeon: Iran Planas, MD;  Location: [place];  Service: Orthopedics;  Laterality: Right;   SURGERY SCROTAL / TESTICULAR     nondecended testes        Home Medications    Prior to Admission medications   Medication Sig Start Date End Date Taking? Authorizing Provider  atorvastatin (LIPITOR) 40 MG tablet TAKE 1 TABLET BY MOUTH ONCE DAILY AT  6PM. Patient not taking: Reported on 02/09/2019 07/08/18   Belva Crome, MD  clopidogrel (PLAVIX) 75 MG tablet Take  1 tablet (75 mg total) by mouth daily. 02/09/19   Dyann KiefLenze, Michele M, PA-C  Dolutegravir-lamiVUDine 50-300 MG TABS Take 1 tablet by mouth daily.  06/03/18   [provider]  metoprolol succinate (TOPROL-XL) 25 MG 24 hr tablet TAKE 1/2 (ONE-HALF) TABLET BY MOUTH TWICE DAILY Patient taking differently: Take 12.5 mg by mouth 2 (two) times daily.  04/24/18   Lyn RecordsSmith, Henry W, MD  nitroGLYCERIN (NITROSTAT) 0.4 MG SL tablet Place 1 tablet (0.4 mg total) under the tongue every 5 (five) minutes x 3  doses as needed for chest pain. 03/19/18   Lyn RecordsSmith, Henry W, MD  oxyCODONE-acetaminophen (PERCOCET) 10-325 MG tablet Take 0.5-1 tablets by mouth every 4 (four) hours as needed for pain.    [provider]    Family History Family History  Problem Relation Age of Onset   Cancer Mother        lung   Cancer Brother        lung, liver, and colon    Social History Social History   Tobacco Use   Smoking status: Former Smoker    Packs/day: 0.50    Years: 45.00    Pack years: 22.50    Types: Cigarettes    Quit date: 2013    Years since quitting: 7.6   Smokeless tobacco: Never Used  Substance Use Topics   Alcohol use: Yes    Alcohol/week: 3.0 standard drinks    Types: 3 Cans of beer per week    Comment: beer daily   Drug use: Yes    Types: Marijuana    Comment: used  Marijuana     Allergies   Chocolate, Lac bovis, Lactose intolerance (gi), and Vicodin [hydrocodone-acetaminophen]   Review of Systems Review of Systems Ten systems reviewed and are negative for acute change, except as noted in the HPI.    Physical Exam Updated Vital Signs BP (!) 120/96 (BP Location: Left Arm)    Pulse 80    Temp 99.1 F (37.3 C) (Oral)    Resp 16    SpO2 95%   Physical Exam Vitals signs and nursing note reviewed.  Constitutional:      General: He is not in acute distress.    Appearance: He is well-developed. He is not diaphoretic.  HENT:     Head: Normocephalic and atraumatic.  Eyes:     General: No scleral icterus.    Conjunctiva/sclera: Conjunctivae normal.  Neck:     Musculoskeletal: Normal range of motion and neck supple.  Cardiovascular:     Rate and Rhythm: Normal rate and regular rhythm.     Heart sounds: Normal heart sounds.  Pulmonary:     Effort: Pulmonary effort is normal. No respiratory distress.     Breath sounds: Normal breath sounds.  Abdominal:     Palpations: Abdomen is soft.     Tenderness: There is no abdominal tenderness.  Musculoskeletal:      Comments: Patient with tenderness in the mid posterior hamstring region.  He has normal strength with flexion of the knee.  There is no bruising or obvious swelling. He has a normal ipsilateral hip knee and ankle examination.  DP and PT pulses are 2+ bilaterally with cap refill less than 2 seconds and normal sensation.  Skin:    General: Skin is warm and dry.  Neurological:     Mental Status: He is alert.  Psychiatric:        Behavior: Behavior normal.      ED Treatments /  Results  Labs (all labs ordered are listed, but only abnormal results are displayed) Labs Reviewed - No data to display  EKG None  Radiology No results found.  Procedures Procedures (including critical care time)  Medications Ordered in ED Medications - No data to display   Initial Impression / Assessment and Plan / ED Course  I have reviewed the triage vital signs and the nursing notes.  Pertinent labs & imaging results that were available during my care of the patient were reviewed by me and considered in my medical decision making (see chart for details).        Patient with fall.  I have no concern for knee dislocation.  He has pain in the posterior hamstring region and likely has a muscle strain.  I doubt full tear of the muscle.  He is able to flex and extend the leg.  He is able to move his joints appropriately.  I do not feel that he needs any imaging at this time.  Patient will be placed in a knee immobilizer and given crutches.  He is advised to follow closely with orthopedics.  I discussed return precautions.  Appears appropriate for discharge at this time.  Final Clinical Impressions(s) / ED Diagnoses   Final diagnoses:  Leg injury, left, initial encounter    ED Discharge Orders    None       Arthor CaptainHarris, Kaleisha Bhargava, PA-C 04/02/19 1149    Sabas SousBero, Michael M, MD 04/05/19 [phone]

## 2019-04-02 NOTE — ED Triage Notes (Signed)
Pt here for mechanical fall this morning, has pain to back of left leg. No LOC.

## 2019-04-02 NOTE — ED Notes (Signed)
Paged ortho 

## 2019-04-02 NOTE — Discharge Instructions (Signed)
Contact a health care provider if:  You have more pain or swelling in the injured area.  Get help right away if:  You have numbness or tingling or lose a lot of strength in the injured area.

## 2019-04-22 NOTE — Progress Notes (Signed)
Cardiology Office Note:    Date:  04/23/2019   ID:  Omar Sandoval, DOB 06/16/1956, MRN [id number]  PCP:  Bartholomew Boards, MD  Cardiologist:  Lesleigh Noe, MD   Referring MD: Bartholomew Boards, MD   Chief Complaint  Patient presents with  . Coronary Artery Disease    History of Present Illness:    Omar Sandoval is a 63 y.o. male with a hx of HIV, hypertension, HLD, PVCs, chronic combined systolic and diastolic CHF.  Also has CAD status post inferior STEMI 01/2017 treated with DES to the RCA with residual 70% mid LAD and 60 to 70% distal circumflex EF 40 to 50% at that time.  Echo after cath 02/06/2017 EF 45 to 50%.   Omar Sandoval is having no significant episodes of chest discomfort.  He has not had metoprolol, Plavix, or Lipitor yet today because he had an early appointment with his psychiatrist.  The health agency that does transportation had to pick him up at 6 AM.  He does not take his medication on an empty stomach.  The other issue is that he fell and severely bruised his left leg and hurt his back.  He is now having pain and burning in his left thigh and hip.  Past Medical History:  Diagnosis Date  . HIV (human immunodeficiency virus infection) (HCC)   . Immune deficiency disorder (HCC)   . Inguinal hernia    Left  . Myocardial infarction [facility])    01-2017 angioplasty with stents    Past Surgical History:  Procedure Laterality Date  . COLONOSCOPY    . CORONARY ANGIOPLASTY    . CORONARY/GRAFT ACUTE MI REVASCULARIZATION N/A 02/03/2017   Procedure: Coronary/Graft Acute MI Revascularization;  Surgeon: [name], MD;  Location: [facility] INVASIVE CV LAB;  Service: Cardiovascular;  Laterality: N/A;  . FRACTURE SURGERY     left ankle and right foot  . HERNIA REPAIR    . INGUINAL HERNIA REPAIR Left 08/27/2017   Procedure: LAPAROSCOPIC LEFT INGUINAL HERNIA REPAIR WITH MESH;  Surgeon: Axel Filler, MD;  Location: [facility] OR;  Service: General;  Laterality: Left;  . INSERTION OF MESH Left 08/27/2017    Procedure: INSERTION OF MESH;  Surgeon: Axel Filler, MD;  Location: [facility] OR;  Service: General;  Laterality: Left;  . LEFT HEART CATH AND CORONARY ANGIOGRAPHY N/A 02/03/2017   Procedure: Left Heart Cath and Coronary Angiography;  Surgeon: [name], MD;  Location: [facility] INVASIVE CV LAB;  Service: Cardiovascular;  Laterality: N/A;  . LEFT HEART CATH AND CORONARY ANGIOGRAPHY N/A 09/25/2018   Procedure: LEFT HEART CATH AND CORONARY ANGIOGRAPHY;  Surgeon: [name], MD;  Location: [facility] INVASIVE CV LAB;  Service: Cardiovascular;  Laterality: N/A;  . OPEN REDUCTION INTERNAL FIXATION (ORIF) DISTAL RADIAL FRACTURE Right 07/20/2016   Procedure: OPEN REDUCTION INTERNAL FIXATION (ORIF) DISTAL RADIAL FRACTURE;  Surgeon: [name], MD;  Location: [facility] OR;  Service: Orthopedics;  Laterality: Right;  . SURGERY SCROTAL / TESTICULAR     nondecended testes    Current Medications: Current Meds  Medication Sig  . atorvastatin (LIPITOR) 40 MG tablet TAKE 1 TABLET BY MOUTH ONCE DAILY AT  6PM.  . clopidogrel (PLAVIX) 75 MG tablet Take 1 tablet (75 mg total) by mouth daily.  . Dolutegravir-lamiVUDine 50-300 MG TABS Take 1 tablet by mouth daily.   . metoprolol succinate (TOPROL-XL) 25 MG 24 hr tablet TAKE 1/2 (ONE-HALF) TABLET BY MOUTH TWICE DAILY  . nitroGLYCERIN (NITROSTAT) 0.4 MG SL tablet  Place 1 tablet (0.4 mg total) under the tongue every 5 (five) minutes x 3 doses as needed for chest pain.  [name] oxyCODONE-acetaminophen (PERCOCET) 10-325 MG tablet Take 0.5-1 tablets by mouth every 4 (four) hours as needed for pain.     Allergies:   Chocolate, Lac bovis, Lactose intolerance (gi), and Vicodin [hydrocodone-acetaminophen]   Social History   Socioeconomic History  . Marital status: Single    Spouse name: Not on file  . Number of children: Not on file  . Years of education: Not on file  . Highest education level: Not on file  Occupational History  . Not on file  Social Needs  . Financial resource  strain: Not on file  . Food insecurity    Worry: Not on file    Inability: Not on file  . Transportation needs    Medical: Not on file    Non-medical: Not on file  Tobacco Use  . Smoking status: Former Smoker    Packs/day: 0.50    Years: 45.00    Pack years: 22.50    Types: Cigarettes    Quit date: 2013    Years since quitting: 7.6  . Smokeless tobacco: Never Used  Substance and Sexual Activity  . Alcohol use: Yes    Alcohol/week: 3.0 standard drinks    Types: 3 Cans of beer per week    Comment: beer daily  . Drug use: Yes    Types: Marijuana    Comment: used  Marijuana  . Sexual activity: Not on file  Lifestyle  . Physical activity    Days per week: Not on file    Minutes per session: Not on file  . Stress: Not on file  Relationships  . Social Herbalist on phone: Not on file    Gets together: Not on file    Attends religious service: Not on file    Active member of club or organization: Not on file    Attends meetings of clubs or organizations: Not on file    Relationship status: Not on file  Other Topics Concern  . Not on file  Social History Narrative  . Not on file     Family History: The patient's family history includes Cancer in his brother and mother.  ROS:   Please see the history of present illness.    Sleeping in the room when I eventually got into see him.  He is having concerns about housing.  His fall was related to buckling tile on the floor in his apartment.  He cannot get it fixed through his landlord.  He is fighting a battle to improve this.  All other systems reviewed and are negative.  EKGs/Labs/Other Studies Reviewed:    The following studies were reviewed today: No new data  EKG:  EKG a new tracing is not performed.  Recent Labs: 09/18/2018: ALT 20; BUN 8; Creatinine, Ser 0.88; Potassium 4.4; Sodium 144; TSH 0.995 09/22/2018: Hemoglobin 13.2; Platelets 302  Recent Lipid Panel    Component Value Date/Time   CHOL 126  07/08/2018 0901   TRIG 43 07/08/2018 0901   HDL 46 07/08/2018 0901   CHOLHDL 2.7 07/08/2018 0901   CHOLHDL 3.3 02/03/2017 1323   VLDL 8 02/03/2017 1323   LDLCALC 71 07/08/2018 0901    Physical Exam:    VS:  BP 104/68   Pulse (!) 104   Ht 5\' 8"  (1.727 m)   Wt 192 lb (87.1 kg)   SpO2  97%   BMI 29.19 kg/m     Wt Readings from Last 3 Encounters:  04/23/19 192 lb (87.1 kg)  02/09/19 195 lb (88.5 kg)  10/25/18 203 lb (92.1 kg)     GEN: [name] appears to be gaining weight.  When I look at his scale numbers he is stable to slightly decreased.. No acute distress HEENT: Normal NECK: No JVD. LYMPHATICS: No lymphadenopathy CARDIAC:  RRR without murmur, gallop, or edema. VASCULAR:  Normal Pulses. No bruits. RESPIRATORY:  Clear to auscultation without rales, wheezing or rhonchi  ABDOMEN: Soft, non-tender, non-distended, No pulsatile mass, MUSCULOSKELETAL: No deformity  SKIN: Warm and dry NEUROLOGIC:  Alert and oriented x 3 PSYCHIATRIC:  Normal affect   ASSESSMENT:    1. Coronary artery disease involving native coronary artery of native heart with angina pectoris (HCC)   2. Chronic combined systolic and diastolic heart failure (HCC)   3. Hyperlipidemia, unspecified hyperlipidemia type   4. Essential hypertension   5. OSA on CPAP   6. Educated About Covid-19 Virus Infection    PLAN:    In order of problems listed above:  1. Stable without significant angina. 2. No evidence of volume overload. 3. LDL was 43 when last checked in November.  This will be repeated this fall. 4. Target blood pressure 130/80 mmHg. 5. Encouraged CPAP. 6. Washing, wearing, and washing is emphasized.   Clinical follow-up in 6 to 9 months.  Encourage aerobic activity.  Overall education and awareness concerning primary/secondary risk prevention was discussed in detail: LDL less than 70, hemoglobin A1c less than 7, blood pressure target less than 130/80 mmHg, >150 minutes of moderate aerobic activity  per week, avoidance of smoking, weight control (via diet and exercise), and continued surveillance/management of/for obstructive sleep apnea.    Medication Adjustments/Labs and Tests Ordered: Current medicines are reviewed at length with the patient today.  Concerns regarding medicines are outlined above.  Orders Placed This Encounter  Procedures  . Hepatic function panel  . Lipid panel  . Basic metabolic panel  . HgB A1c   No orders of the defined types were placed in this encounter.   Patient Instructions  Medication Instructions:  Your physician recommends that you continue on your current medications as directed. Please refer to the Current Medication list given to you today.  If you need a refill on your cardiac medications before your next appointment, please call your pharmacy.   Lab work: Your physician recommends that you return for lab work in: November or December (Liver, Lipid, BMET, A1C).  Please come fasting for these labs (nothing to eat or drink after midnight except water and black coffee).  If you have labs (blood work) drawn today and your tests are completely normal, you will receive your results only by: [name]. MyChart Message (if you have MyChart) OR . A paper copy in the mail If you have any lab test that is abnormal or we need to change your treatment, we will call you to review the results.  Testing/Procedures: None  Follow-Up: At [facility], you and your health needs are our priority.  As part of our continuing mission to provide you with exceptional heart care, we have created designated Provider Care Teams.  These Care Teams include your primary Cardiologist (physician) and Advanced Practice Providers (APPs -  Physician Assistants and Nurse Practitioners) who all work together to provide you with the care you need, when you need it. You will need a follow up appointment in 6-8 months.  Please call our office 2 months in advance to schedule this  appointment.  You may see Lesleigh NoeHenry W Smith III, MD or one of the following Advanced Practice Providers on your designated Care Team:   Norma FredricksonLori Gerhardt, NP Nada BoozerLaura Ingold, NP . Georgie ChardJill McDaniel, NP  Any Other Special Instructions Will Be Listed Below (If Applicable).       Signed, Lesleigh NoeHenry W Smith III, MD  04/23/2019 12:47 PM    [facility]

## 2019-04-23 ENCOUNTER — Encounter: Payer: Self-pay | Admitting: Interventional Cardiology

## 2019-04-23 ENCOUNTER — Ambulatory Visit (INDEPENDENT_AMBULATORY_CARE_PROVIDER_SITE_OTHER): Payer: Medicare HMO | Admitting: Interventional Cardiology

## 2019-04-23 ENCOUNTER — Other Ambulatory Visit: Payer: Self-pay

## 2019-04-23 VITALS — BP 104/68 | HR 104 | Ht 68.0 in | Wt 192.0 lb

## 2019-04-23 DIAGNOSIS — E785 Hyperlipidemia, unspecified: Secondary | ICD-10-CM

## 2019-04-23 DIAGNOSIS — I5042 Chronic combined systolic (congestive) and diastolic (congestive) heart failure: Secondary | ICD-10-CM

## 2019-04-23 DIAGNOSIS — I25119 Atherosclerotic heart disease of native coronary artery with unspecified angina pectoris: Secondary | ICD-10-CM

## 2019-04-23 DIAGNOSIS — I10 Essential (primary) hypertension: Secondary | ICD-10-CM | POA: Diagnosis not present

## 2019-04-23 DIAGNOSIS — G4733 Obstructive sleep apnea (adult) (pediatric): Secondary | ICD-10-CM

## 2019-04-23 DIAGNOSIS — Z7189 Other specified counseling: Secondary | ICD-10-CM

## 2019-04-23 DIAGNOSIS — Z9989 Dependence on other enabling machines and devices: Secondary | ICD-10-CM

## 2019-04-23 NOTE — Patient Instructions (Signed)
Medication Instructions:  Your physician recommends that you continue on your current medications as directed. Please refer to the Current Medication list given to you today.  If you need a refill on your cardiac medications before your next appointment, please call your pharmacy.   Lab work: Your physician recommends that you return for lab work in: November or December (Liver, Lipid, BMET, A1C).  Please come fasting for these labs (nothing to eat or drink after midnight except water and black coffee).  If you have labs (blood work) drawn today and your tests are completely normal, you will receive your results only by: [name] MyChart Message (if you have MyChart) OR . A paper copy in the mail If you have any lab test that is abnormal or we need to change your treatment, we will call you to review the results.  Testing/Procedures: None  Follow-Up: At [facility], you and your health needs are our priority.  As part of our continuing mission to provide you with exceptional heart care, we have created designated Provider Care Teams.  These Care Teams include your primary Cardiologist (physician) and Advanced Practice Providers (APPs -  Physician Assistants and Nurse Practitioners) who all work together to provide you with the care you need, when you need it. You will need a follow up appointment in 6-8 months.  Please call our office 2 months in advance to schedule this appointment.  You may see Sinclair Grooms, MD or one of the following Advanced Practice Providers on your designated Care Team:   Truitt Merle, NP Cecilie Kicks, NP . Kathyrn Drown, NP  Any Other Special Instructions Will Be Listed Below (If Applicable).

## 2019-05-14 ENCOUNTER — Other Ambulatory Visit: Payer: Self-pay | Admitting: Interventional Cardiology

## 2019-07-05 ENCOUNTER — Other Ambulatory Visit: Payer: Medicare HMO

## 2019-11-24 ENCOUNTER — Other Ambulatory Visit: Payer: Medicare HMO

## 2019-11-30 NOTE — Progress Notes (Deleted)
Cardiology Office Note:    Date:  11/30/2019   ID:  Omar Sandoval, DOB 1956/07/15, MRN [id number]  PCP:  Eyvonne Mechanic, MD  Cardiologist:  Sinclair Grooms, MD   Referring MD: Eyvonne Mechanic, MD   No chief complaint on file.   History of Present Illness:    Omar Sandoval is a 64 y.o. male with a hx of HIV, hypertension, HLD, PVCs, chronic combined systolic and diastolic CHF. Also has CAD status post inferior STEMI 01/2017 treated with DES to the RCA with residual 70% mid LAD and 60 to 70% distal circumflex EF 40 to 50% at that time. Echo after cath 02/06/2017 EF 45 to 50%.   ***  Past Medical History:  Diagnosis Date  . HIV (human immunodeficiency virus infection) ([place])   . Immune deficiency disorder ([place])   . Inguinal hernia    Left  . Myocardial infarction [facility])    01-2017 angioplasty with stents    Past Surgical History:  Procedure Laterality Date  . COLONOSCOPY    . CORONARY ANGIOPLASTY    . CORONARY/GRAFT ACUTE MI REVASCULARIZATION N/A 02/03/2017   Procedure: Coronary/Graft Acute MI Revascularization;  Surgeon: Belva Crome, MD;  Location: [place] CV LAB;  Service: Cardiovascular;  Laterality: N/A;  . FRACTURE SURGERY     left ankle and right foot  . HERNIA REPAIR    . INGUINAL HERNIA REPAIR Left 08/27/2017   Procedure: LAPAROSCOPIC LEFT INGUINAL HERNIA REPAIR WITH MESH;  Surgeon: Ralene Ok, MD;  Location: [place];  Service: General;  Laterality: Left;  . INSERTION OF MESH Left 08/27/2017   Procedure: INSERTION OF MESH;  Surgeon: Ralene Ok, MD;  Location: [place];  Service: General;  Laterality: Left;  . LEFT HEART CATH AND CORONARY ANGIOGRAPHY N/A 02/03/2017   Procedure: Left Heart Cath and Coronary Angiography;  Surgeon: Belva Crome, MD;  Location: [place] CV LAB;  Service: Cardiovascular;  Laterality: N/A;  . LEFT HEART CATH AND CORONARY ANGIOGRAPHY N/A 09/25/2018   Procedure: LEFT HEART CATH AND CORONARY ANGIOGRAPHY;  Surgeon: Belva Crome, MD;   Location: [place] CV LAB;  Service: Cardiovascular;  Laterality: N/A;  . OPEN REDUCTION INTERNAL FIXATION (ORIF) DISTAL RADIAL FRACTURE Right 07/20/2016   Procedure: OPEN REDUCTION INTERNAL FIXATION (ORIF) DISTAL RADIAL FRACTURE;  Surgeon: Iran Planas, MD;  Location: [place];  Service: Orthopedics;  Laterality: Right;  . SURGERY SCROTAL / TESTICULAR     nondecended testes    Current Medications: No outpatient medications have been marked as taking for the 12/01/19 encounter (Appointment) with Belva Crome, MD.     Allergies:   Chocolate, Lac bovis, Lactose intolerance (gi), and Vicodin [hydrocodone-acetaminophen]   Social History   Socioeconomic History  . Marital status: Single    Spouse name: Not on file  . Number of children: Not on file  . Years of education: Not on file  . Highest education level: Not on file  Occupational History  . Not on file  Tobacco Use  . Smoking status: Former Smoker    Packs/day: 0.50    Years: 45.00    Pack years: 22.50    Types: Cigarettes    Quit date: 2013    Years since quitting: 8.2  . Smokeless tobacco: Never Used  Substance and Sexual Activity  . Alcohol use: Yes    Alcohol/week: 3.0 standard drinks    Types: 3 Cans of beer per week    Comment: beer daily  . Drug  use: Yes    Types: Marijuana    Comment: used  Marijuana  . Sexual activity: Not on file  Other Topics Concern  . Not on file  Social History Narrative  . Not on file   Social Determinants of Health   Financial Resource Strain:   . Difficulty of Paying Living Expenses:   Food Insecurity:   . Worried About Programme researcher, broadcasting/film/video in the Last Year:   . Barista in the Last Year:   Transportation Needs:   . Freight forwarder (Medical):   [name] Lack of Transportation (Non-Medical):   Physical Activity:   . Days of Exercise per Week:   . Minutes of Exercise per Session:   Stress:   . Feeling of Stress :   Social Connections:   . Frequency of Communication  with Friends and Family:   . Frequency of Social Gatherings with Friends and Family:   . Attends Religious Services:   . Active Member of Clubs or Organizations:   . Attends Banker Meetings:   [name] Marital Status:      Family History: The patient's family history includes Cancer in his brother and mother.  ROS:   Please see the history of present illness.    *** All other systems reviewed and are negative.  EKGs/Labs/Other Studies Reviewed:    The following studies were reviewed today: ***  EKG:  EKG ***  Recent Labs: No results found for requested labs within last 8760 hours.  Recent Lipid Panel    Component Value Date/Time   CHOL 126 07/08/2018 0901   TRIG 43 07/08/2018 0901   HDL 46 07/08/2018 0901   CHOLHDL 2.7 07/08/2018 0901   CHOLHDL 3.3 02/03/2017 1323   VLDL 8 02/03/2017 1323   LDLCALC 71 07/08/2018 0901    Physical Exam:    VS:  There were no vitals taken for this visit.    Wt Readings from Last 3 Encounters:  04/23/19 192 lb (87.1 kg)  02/09/19 195 lb (88.5 kg)  10/25/18 203 lb (92.1 kg)     GEN: ***. No acute distress HEENT: Normal NECK: No JVD. LYMPHATICS: No lymphadenopathy CARDIAC: *** RRR without murmur, gallop, or edema. VASCULAR: *** Normal Pulses. No bruits. RESPIRATORY:  Clear to auscultation without rales, wheezing or rhonchi  ABDOMEN: Soft, non-tender, non-distended, No pulsatile mass, MUSCULOSKELETAL: No deformity  SKIN: Warm and dry NEUROLOGIC:  Alert and oriented x 3 PSYCHIATRIC:  Normal affect   ASSESSMENT:    1. Coronary artery disease involving native coronary artery of native heart with angina pectoris (HCC)   2. Chronic combined systolic and diastolic heart failure (HCC)   3. Hyperlipidemia, unspecified hyperlipidemia type   4. Essential hypertension   5. HIV disease (HCC)    PLAN:    In order of problems listed above:  1. ***   Medication Adjustments/Labs and Tests Ordered: Current medicines are  reviewed at length with the patient today.  Concerns regarding medicines are outlined above.  No orders of the defined types were placed in this encounter.  No orders of the defined types were placed in this encounter.   There are no Patient Instructions on file for this visit.   Signed, Lesleigh Noe, MD  11/30/2019 11:01 AM    [facility]

## 2019-12-01 ENCOUNTER — Ambulatory Visit: Payer: Medicare HMO | Admitting: Interventional Cardiology

## 2019-12-15 NOTE — Progress Notes (Deleted)
CARDIOLOGY OFFICE NOTE  Date:  12/28/2019    Omar Sandoval Date of Birth: 19-May-1956 Medical Record #[id number]  PCP:  Bartholomew Boards, MD  Cardiologist:  Princella Ion chief complaint on file.   History of Present Illness: Omar Sandoval is a 64 y.o. male who presents today for a follow up visit. Seen for Dr. Katrinka Blazing.   He has a history of HIV, HTN, HLD, PVCs, chronic systolic and diastolic HF, CAD with prior  inferior STEMI 01/2017 treated with DES to the RCA with residual 70% mid LAD and 60 to 70% distal circumflex EF 40 to 50% at that time. Echo after cath 02/06/2017 EF 45 to 50%.   He was last seen by Dr. Katrinka Blazing back in August - cardiac status felt to be ok.   The patient {does/does not:200015} have symptoms concerning for COVID-19 infection (fever, chills, cough, or new shortness of breath).   Comes in today. Here with   Past Medical History:  Diagnosis Date  . HIV (human immunodeficiency virus infection) (HCC)   . Immune deficiency disorder (HCC)   . Inguinal hernia    Left  . Myocardial infarction [facility])    01-2017 angioplasty with stents    Past Surgical History:  Procedure Laterality Date  . COLONOSCOPY    . CORONARY ANGIOPLASTY    . CORONARY/GRAFT ACUTE MI REVASCULARIZATION N/A 02/03/2017   Procedure: Coronary/Graft Acute MI Revascularization;  Surgeon: [name], MD;  Location: [facility] INVASIVE CV LAB;  Service: Cardiovascular;  Laterality: N/A;  . FRACTURE SURGERY     left ankle and right foot  . HERNIA REPAIR    . INGUINAL HERNIA REPAIR Left 08/27/2017   Procedure: LAPAROSCOPIC LEFT INGUINAL HERNIA REPAIR WITH MESH;  Surgeon: Axel Filler, MD;  Location: [facility] OR;  Service: General;  Laterality: Left;  . INSERTION OF MESH Left 08/27/2017   Procedure: INSERTION OF MESH;  Surgeon: Axel Filler, MD;  Location: [facility] OR;  Service: General;  Laterality: Left;  . LEFT HEART CATH AND CORONARY ANGIOGRAPHY N/A 02/03/2017   Procedure: Left Heart Cath and Coronary Angiography;   Surgeon: [name], MD;  Location: [facility] INVASIVE CV LAB;  Service: Cardiovascular;  Laterality: N/A;  . LEFT HEART CATH AND CORONARY ANGIOGRAPHY N/A 09/25/2018   Procedure: LEFT HEART CATH AND CORONARY ANGIOGRAPHY;  Surgeon: [name], MD;  Location: [facility] INVASIVE CV LAB;  Service: Cardiovascular;  Laterality: N/A;  . OPEN REDUCTION INTERNAL FIXATION (ORIF) DISTAL RADIAL FRACTURE Right 07/20/2016   Procedure: OPEN REDUCTION INTERNAL FIXATION (ORIF) DISTAL RADIAL FRACTURE;  Surgeon: [name], MD;  Location: [facility] OR;  Service: Orthopedics;  Laterality: Right;  . SURGERY SCROTAL / TESTICULAR     nondecended testes     Medications: No outpatient medications have been marked as taking for the 12/29/19 encounter (Appointment) with Rosalio Macadamia, NP.     Allergies: Allergies  Allergen Reactions  . Chocolate Diarrhea  . Lac Bovis Diarrhea  . Lactose Intolerance (Gi) Diarrhea and Nausea And Vomiting  . Vicodin [Hydrocodone-Acetaminophen] Nausea Only and Other (See Comments)    GI Upset    Social History: The patient  reports that he quit smoking about 8 years ago. His smoking use included cigarettes. He has a 22.50 pack-year smoking history. He has never used smokeless tobacco. He reports current alcohol use of about 3.0 standard drinks of alcohol per week. He reports current drug use. Drug: Marijuana.   Family History: The patient's ***family history includes  Cancer in his brother and mother.   Review of Systems: Please see the history of present illness.   All other systems are reviewed and negative.   Physical Exam: VS:  There were no vitals taken for this visit. [name]  BMI There is no height or weight on file to calculate BMI.  Wt Readings from Last 3 Encounters:  04/23/19 192 lb (87.1 kg)  02/09/19 195 lb (88.5 kg)  10/25/18 203 lb (92.1 kg)    General: Pleasant. Well developed, well nourished and in no acute distress.   HEENT: Normal.  Neck: Supple, no JVD, carotid  bruits, or masses noted.  Cardiac: ***Regular rate and rhythm. No murmurs, rubs, or gallops. No edema.  Respiratory:  Lungs are clear to auscultation bilaterally with normal work of breathing.  GI: Soft and nontender.  MS: No deformity or atrophy. Gait and ROM intact.  Skin: Warm and dry. Color is normal.  Neuro:  Strength and sensation are intact and no gross focal deficits noted.  Psych: Alert, appropriate and with normal affect.   LABORATORY DATA:  EKG:  EKG {ACTION; IS/IS GLO:[id number]} ordered today.  Personally reviewed by me. This demonstrates ***.  Lab Results  Component Value Date   WBC 3.9 09/22/2018   HGB 13.2 09/22/2018   HCT 38.3 (L) 09/22/2018   PLT 302 09/22/2018   GLUCOSE 68 09/18/2018   CHOL 126 07/08/2018   TRIG 43 07/08/2018   HDL 46 07/08/2018   LDLCALC 71 07/08/2018   ALT 20 09/18/2018   AST 23 09/18/2018   NA 144 09/18/2018   K 4.4 09/18/2018   CL 102 09/18/2018   CREATININE 0.88 09/18/2018   BUN 8 09/18/2018   CO2 27 09/18/2018   TSH 0.995 09/18/2018   INR 0.97 08/22/2017   HGBA1C 5.5 02/04/2017     BNP (last 3 results) No results for input(s): BNP in the last 8760 hours.  ProBNP (last 3 results) No results for input(s): PROBNP in the last 8760 hours.   Other Studies Reviewed Today:  LEFT HEART CATH AND CORONARY ANGIOGRAPHY 08/2018  Conclusion   Moderately severe coronary artery disease with stable anatomy since acute intervention in June 2018.  Distal left main is calcified and is 40 to 50% narrowed.  Proximal to mid LAD within a tortuous segment after the first diagonal.  Lesion is calcified and angulated.  Widely patent ramus  Circumflex contains distal 70% and distal obtuse marginal 80% stenosis.  There is 30% in-stent restenosis in the mid right coronary.  Otherwise, widely patent.  LV function is decreased with akinesis of the inferobasal wall.  EF is estimated to be 35%.  LVEDP is upper normal 16 to 18  mmHg.  RECOMMENDATIONS:   There is mild ischemia in the anterior wall distribution, anatomy is stable compared to a year and a half ago, and symptoms are chronic and stable.  Plan to continue medical therapy.  Intensify secondary risk modification.  Increase statin therapy to maximal intensity.  Unstable symptoms or progressive angina would lead to consideration of CABG versus high risk PCI.  PCI risk would be high due to distal left main through which devices/stents may not track given the angulation of the LAD off the left main and the presence of calcium.  Once anal biopsy is performed, will start Plavix and discontinue aspirin.  Plavix can be paused if further surgical treatment is needed.     Assessment/Plan:  1. CAD  2. Chronic combined systolic and diastolic HF  3. HTN  4. HIV  5. HLD  1. Stable without significant angina. 2. No evidence of volume overload. 3. LDL was 43 when last checked in November.  This will be repeated this fall. 4. Target blood pressure 130/80 mmHg. 5. Encouraged CPAP. 6. Washing, wearing, and washing is emphasized.   Clinical follow-up in 6 to 9 months.  Encourage aerobic activity.  Overall education and awareness concerning primary/secondary risk prevention was discussed in detail: LDL less than 70, hemoglobin A1c less than 7, blood pressure target less than 130/80 mmHg, >150 minutes of moderate aerobic activity per week, avoidance of smoking, weight control (via diet and exercise), and continued surveillance/management of/for obstructive sleep apnea.   [name] COVID-19 Education: The signs and symptoms of COVID-19 were discussed with the patient and how to seek care for testing (follow up with PCP or arrange E-visit).  The importance of social distancing, staying at home, hand hygiene and wearing a mask when out in public were discussed today.  Current medicines are reviewed with the patient today.  The patient does not have concerns  regarding medicines other than what has been noted above.  The following changes have been made:  See above.  Labs/ tests ordered today include:   No orders of the defined types were placed in this encounter.    Disposition:   FU with *** in {gen number [id number]} {Days to years:10300}.   Patient is agreeable to this plan and will call if any problems develop in the interim.   SignedNorma Fredrickson, NP  12/28/2019 1:21 PM  [facility] [address] [place], [place]  [address] Phone: [phone] Fax: [phone]

## 2019-12-24 ENCOUNTER — Other Ambulatory Visit: Payer: Self-pay | Admitting: Physician Assistant

## 2019-12-29 ENCOUNTER — Ambulatory Visit: Payer: Medicare Other | Admitting: Nurse Practitioner

## 2020-01-18 NOTE — Progress Notes (Signed)
Cardiology Office Note:    Date:  01/19/2020   ID:  Omar Sandoval, DOB May 31, 1956, MRN [id number]  PCP:  Omar Boards, MD  Cardiologist:  Omar Noe, MD   Referring MD: Omar Boards, MD   Chief Complaint  Patient presents with  . Coronary Artery Disease    History of Present Illness:    Omar Sandoval is a 64 y.o. male with a hx of HIV, hypertension, HLD, PVCs, chronic combined systolic and diastolic CHF. Also has CAD status post inferior STEMI 01/2017 treated with DES to the RCA with residual 70% mid LAD and 60 to 70% distal circumflex EF 40 to 50% at that time. Echo after cath 02/06/2017 EF 45 to 50%.   Significant left lateral lower chest discomfort. States that it occurs mostly when he is upset and speaks very loudly or screams. He apparently does this frequently. It does not occur spontaneously. He goes away when the situation calms down.  He is not exercising.  He denies orthopnea, PND, and lower extremity swelling  I reviewed labs that were performed at [facility] related to his HIV diagnosis. His LDL is 149 on the most recent laboratory data.  Past Medical History:  Diagnosis Date  . HIV (human immunodeficiency virus infection) (HCC)   . Immune deficiency disorder (HCC)   . Inguinal hernia    Left  . Myocardial infarction [facility])    01-2017 angioplasty with stents    Past Surgical History:  Procedure Laterality Date  . COLONOSCOPY    . CORONARY ANGIOPLASTY    . CORONARY/GRAFT ACUTE MI REVASCULARIZATION N/A 02/03/2017   Procedure: Coronary/Graft Acute MI Revascularization;  Surgeon: Omar Records, MD;  Location: [facility] INVASIVE CV LAB;  Service: Cardiovascular;  Laterality: N/A;  . FRACTURE SURGERY     left ankle and right foot  . HERNIA REPAIR    . INGUINAL HERNIA REPAIR Left 08/27/2017   Procedure: LAPAROSCOPIC LEFT INGUINAL HERNIA REPAIR WITH MESH;  Surgeon: Omar Filler, MD;  Location: [facility] OR;  Service: General;  Laterality: Left;    . INSERTION OF MESH Left 08/27/2017   Procedure: INSERTION OF MESH;  Surgeon: Omar Filler, MD;  Location: [facility] OR;  Service: General;  Laterality: Left;  . LEFT HEART CATH AND CORONARY ANGIOGRAPHY N/A 02/03/2017   Procedure: Left Heart Cath and Coronary Angiography;  Surgeon: Omar Records, MD;  Location: [facility] INVASIVE CV LAB;  Service: Cardiovascular;  Laterality: N/A;  . LEFT HEART CATH AND CORONARY ANGIOGRAPHY N/A 09/25/2018   Procedure: LEFT HEART CATH AND CORONARY ANGIOGRAPHY;  Surgeon: Omar Records, MD;  Location: [facility] INVASIVE CV LAB;  Service: Cardiovascular;  Laterality: N/A;  . OPEN REDUCTION INTERNAL FIXATION (ORIF) DISTAL RADIAL FRACTURE Right 07/20/2016   Procedure: OPEN REDUCTION INTERNAL FIXATION (ORIF) DISTAL RADIAL FRACTURE;  Surgeon: Omar Bienenstock, MD;  Location: [facility] OR;  Service: Orthopedics;  Laterality: Right;  . SURGERY SCROTAL / TESTICULAR     nondecended testes    Current Medications: Current Meds  Medication Sig  . atorvastatin (LIPITOR) 40 MG tablet TAKE 1 TABLET BY MOUTH ONCE DAILY AT  6PM.  . clopidogrel (PLAVIX) 75 MG tablet Take 1 tablet by mouth once daily  . Dolutegravir-lamiVUDine 50-300 MG TABS Take 1 tablet by mouth daily.   . metoprolol succinate (TOPROL-XL) 25 MG 24 hr tablet Take 25 mg by mouth daily.  . nitroGLYCERIN (NITROSTAT) 0.4 MG SL tablet Place 1 tablet (0.4 mg total) under the tongue every 5 (  five) minutes x 3 doses as needed for chest pain.  Omar Sandoval oxyCODONE-acetaminophen (PERCOCET) 10-325 MG tablet Take 0.5-1 tablets by mouth every 4 (four) hours as needed for pain.  . [DISCONTINUED] atorvastatin (LIPITOR) 40 MG tablet TAKE 1 TABLET BY MOUTH ONCE DAILY AT  6PM.     Allergies:   Chocolate, Lac bovis, Lactose intolerance (gi), and Vicodin [hydrocodone-acetaminophen]   Social History   Socioeconomic History  . Marital status: Single    Spouse name: Not on file  . Number of children: Not on file  . Years of education: Not on file  . Highest  education level: Not on file  Occupational History  . Not on file  Tobacco Use  . Smoking status: Former Smoker    Packs/day: 0.50    Years: 45.00    Pack years: 22.50    Types: Cigarettes    Quit date: 2013    Years since quitting: 8.4  . Smokeless tobacco: Never Used  Substance and Sexual Activity  . Alcohol use: Yes    Alcohol/week: 3.0 standard drinks    Types: 3 Cans of beer per week    Comment: beer daily  . Drug use: Yes    Types: Marijuana    Comment: used  Marijuana  . Sexual activity: Not on file  Other Topics Concern  . Not on file  Social History Narrative  . Not on file   Social Determinants of Health   Financial Resource Strain:   . Difficulty of Paying Living Expenses:   Food Insecurity:   . Worried About Programme researcher, broadcasting/film/video in the Last Year:   . Barista in the Last Year:   Transportation Needs:   . Freight forwarder (Medical):   Omar Sandoval Lack of Transportation (Non-Medical):   Physical Activity:   . Days of Exercise per Week:   . Minutes of Exercise per Session:   Stress:   . Feeling of Stress :   Social Connections:   . Frequency of Communication with Friends and Family:   . Frequency of Social Gatherings with Friends and Family:   . Attends Religious Services:   . Active Member of Clubs or Organizations:   . Attends Banker Meetings:   Omar Sandoval Marital Status:      Family History: The patient's family history includes Cancer in his brother and mother.  ROS:   Please see the history of present illness.    Fearful that he may catch Covid.  Many family members have.  He has been vaccinated.  We discussed his improved level of safety with the vaccine although he should continue to practice caution given his immune system all other systems reviewed and are negative.  EKGs/Labs/Other Studies Reviewed:    The following studies were reviewed today: No new cardiac imaging data  EKG:  EKG inferior infarction with Q waves, right bundle  branch block, left axis deviation, 1 PVC.  No change compared to prior.  Recent Labs: No results found for requested labs within last 8760 hours.  Recent Lipid Panel    Component Value Date/Time   CHOL 126 07/08/2018 0901   TRIG 43 07/08/2018 0901   HDL 46 07/08/2018 0901   CHOLHDL 2.7 07/08/2018 0901   CHOLHDL 3.3 02/03/2017 1323   VLDL 8 02/03/2017 1323   LDLCALC 71 07/08/2018 0901    Physical Exam:    VS:  BP 98/62   Pulse 68   Ht 5\' 8"  (1.727 m)  Wt 194 lb (88 kg)   SpO2 95%   BMI 29.50 kg/m     Wt Readings from Last 3 Encounters:  01/19/20 194 lb (88 kg)  04/23/19 192 lb (87.1 kg)  02/09/19 195 lb (88.5 kg)     GEN: Moderate obesity. No acute distress HEENT: Normal NECK: No JVD. LYMPHATICS: No lymphadenopathy CARDIAC:  RRR without murmur, gallop, or edema. VASCULAR:  Normal Pulses. No bruits. RESPIRATORY:  Clear to auscultation without rales, wheezing or rhonchi  ABDOMEN: Soft, non-tender, non-distended, No pulsatile mass, MUSCULOSKELETAL: No deformity  SKIN: Warm and dry NEUROLOGIC:  Alert and oriented x 3 PSYCHIATRIC:  Normal affect   ASSESSMENT:    1. Coronary artery disease involving native coronary artery of native heart with angina pectoris ([place])   2. Chronic combined systolic and diastolic heart failure ([place])   3. Hyperlipidemia, unspecified hyperlipidemia type   4. Essential hypertension   5. OSA on CPAP   6. Educated about COVID-19 virus infection   7. HIV infection, unspecified symptom status ([place])   8. Premature ventricular contractions    PLAN:    In order of problems listed above:  1. Secondary prevention was reviewed. In doing so I noticed laboratory data from [facility] suggesting an LDL of 149. I queried Jaivion concerning Lipitor and he does not know if he is taking it or not. We investigated his refill history and apparently has not been on it since 2019. Atorvastatin 40 mg/day is resumed. He is also encouraged to  achieve 150 minutes of moderate activity per week. 2. No evidence of volume overload. Continue metoprolol succinate. 3. The most recent LDL listed is 13 but this is from November 2019 and the most recent is 149. 4. Blood pressure is well controlled. 5. We did not speak about sleep apnea 6. He has received the [employer] vaccine.   Overall education and awareness concerning primary/secondary risk prevention was discussed in detail: LDL less than 70, hemoglobin A1c less than 7, blood pressure target less than 130/80 mmHg, >150 minutes of moderate aerobic activity per week, avoidance of smoking, weight control (via diet and exercise), and continued surveillance/management of/for obstructive sleep apnea.    Medication Adjustments/Labs and Tests Ordered: Current medicines are reviewed at length with the patient today.  Concerns regarding medicines are outlined above.  Orders Placed This Encounter  Procedures  . EKG 12-Lead   Meds ordered this encounter  Medications  . atorvastatin (LIPITOR) 40 MG tablet    Sig: TAKE 1 TABLET BY MOUTH ONCE DAILY AT  6PM.    Dispense:  90 tablet    Refill:  2    Patient Instructions  Medication Instructions:  Your physician recommends that you continue on your current medications as directed. Please refer to the Current Medication list given to you today.  *If you need a refill on your cardiac medications before your next appointment, please call your pharmacy*   Lab Work: None If you have labs (blood work) drawn today and your tests are completely normal, you will receive your results only by: Omar Sandoval MyChart Message (if you have MyChart) OR . A paper copy in the mail If you have any lab test that is abnormal or we need to change your treatment, we will call you to review the results.   Testing/Procedures: None   Follow-Up: At [facility], you and your health needs are our priority.  As part of our continuing mission to provide you with  exceptional heart care, we have created designated Provider Care Teams.  These Care Teams include your primary Cardiologist (physician) and Advanced Practice Providers (APPs -  Physician Assistants and Nurse Practitioners) who all work together to provide you with the care you need, when you need it.  We recommend signing up for the patient portal called "MyChart".  Sign up information is provided on this After Visit Summary.  MyChart is used to connect with patients for Virtual Visits (Telemedicine).  Patients are able to view lab/test results, encounter notes, upcoming appointments, etc.  Non-urgent messages can be sent to your provider as well.   To learn more about what you can do with MyChart, go to [email or web address].    Your next appointment:   8 month(s)  The format for your next appointment:   In Person  Provider:   You may see Omar Noe, MD or one of the following Advanced Practice Providers on your designated Care Team:    Norma Fredrickson, NP  Nada Boozer, NP  Georgie Chard, NP    Other Instructions      Signed, Omar Noe, MD  01/19/2020 12:41 PM    [facility]

## 2020-01-19 ENCOUNTER — Other Ambulatory Visit: Payer: Self-pay

## 2020-01-19 ENCOUNTER — Encounter: Payer: Self-pay | Admitting: Interventional Cardiology

## 2020-01-19 ENCOUNTER — Ambulatory Visit (INDEPENDENT_AMBULATORY_CARE_PROVIDER_SITE_OTHER): Payer: Medicare Other | Admitting: Interventional Cardiology

## 2020-01-19 VITALS — BP 98/62 | HR 68 | Ht 68.0 in | Wt 194.0 lb

## 2020-01-19 DIAGNOSIS — I25119 Atherosclerotic heart disease of native coronary artery with unspecified angina pectoris: Secondary | ICD-10-CM

## 2020-01-19 DIAGNOSIS — I10 Essential (primary) hypertension: Secondary | ICD-10-CM | POA: Diagnosis not present

## 2020-01-19 DIAGNOSIS — I5042 Chronic combined systolic (congestive) and diastolic (congestive) heart failure: Secondary | ICD-10-CM

## 2020-01-19 DIAGNOSIS — Z9989 Dependence on other enabling machines and devices: Secondary | ICD-10-CM

## 2020-01-19 DIAGNOSIS — I493 Ventricular premature depolarization: Secondary | ICD-10-CM

## 2020-01-19 DIAGNOSIS — E785 Hyperlipidemia, unspecified: Secondary | ICD-10-CM | POA: Diagnosis not present

## 2020-01-19 DIAGNOSIS — B20 Human immunodeficiency virus [HIV] disease: Secondary | ICD-10-CM

## 2020-01-19 DIAGNOSIS — G4733 Obstructive sleep apnea (adult) (pediatric): Secondary | ICD-10-CM

## 2020-01-19 DIAGNOSIS — Z7189 Other specified counseling: Secondary | ICD-10-CM

## 2020-01-19 MED ORDER — ATORVASTATIN CALCIUM 40 MG PO TABS: ORAL_TABLET | ORAL | 2 refills | Status: DC

## 2020-01-19 NOTE — Patient Instructions (Signed)
Medication Instructions:  Your physician recommends that you continue on your current medications as directed. Please refer to the Current Medication list given to you today.  *If you need a refill on your cardiac medications before your next appointment, please call your pharmacy*   Lab Work: None If you have labs (blood work) drawn today and your tests are completely normal, you will receive your results only by: . MyChart Message (if you have MyChart) OR . A paper copy in the mail If you have any lab test that is abnormal or we need to change your treatment, we will call you to review the results.   Testing/Procedures: None   Follow-Up: At [facility], you and your health needs are our priority.  As part of our continuing mission to provide you with exceptional heart care, we have created designated Provider Care Teams.  These Care Teams include your primary Cardiologist (physician) and Advanced Practice Providers (APPs -  Physician Assistants and Nurse Practitioners) who all work together to provide you with the care you need, when you need it.  We recommend signing up for the patient portal called "MyChart".  Sign up information is provided on this After Visit Summary.  MyChart is used to connect with patients for Virtual Visits (Telemedicine).  Patients are able to view lab/test results, encounter notes, upcoming appointments, etc.  Non-urgent messages can be sent to your provider as well.   To learn more about what you can do with MyChart, go to [email or web address].    Your next appointment:   8 month(s)  The format for your next appointment:   In Person  Provider:   You may see Henry W Smith III, MD or one of the following Advanced Practice Providers on your designated Care Team:    Lori Gerhardt, NP  Laura Ingold, NP  Jill McDaniel, NP    Other Instructions   

## 2020-05-17 ENCOUNTER — Telehealth: Payer: Self-pay | Admitting: *Deleted

## 2020-05-17 NOTE — Telephone Encounter (Signed)
Dr. Tamala Julian,  Mr Omar Sandoval with hx of CAD, lst stent 2018 and last cath 2020 with plans for medical therapy.  last visit in May and was stable and statin was resumed.  We will call prior to clearance but is it ok to be off plavix for 5 days for extraction of 12 teeth?  Please respond to pre-op pool thanks.

## 2020-05-17 NOTE — Telephone Encounter (Signed)
Omar Sandoval it is.

## 2020-05-17 NOTE — Telephone Encounter (Signed)
   [facility] Pre-operative Risk Assessment    HEARTCARE STAFF: - Please ensure there is not already an duplicate clearance open for this procedure. - Under Visit Info/Reason for Call, type in Other and utilize the format Clearance MM/DD/YY or Clearance TBD. Do not use dashes or single digits. - If request is for dental extraction, please clarify the # of teeth to be extracted.  Request for surgical clearance:  1. What type of surgery is being performed? 12 TEETH TO BE SURGICALLY EXTRACTED   2. When is this surgery scheduled? TBD   3. What type of clearance is required (medical clearance vs. Pharmacy clearance to hold med vs. Both)? MEDICAL  4. Are there any medications that need to be held prior to surgery and how long? PLAVIX   5. Practice name and name of physician performing surgery? [facility]; DR. Lawanna Kobus  6. What is the office phone number? [phone]   7.   What is the office fax number? [phone]  8.   Anesthesia type (None, local, MAC, general) LOCAL   Julaine Hua 05/17/2020, 12:44 PM  _________________________________________________________________   (provider comments below)

## 2020-05-19 NOTE — Telephone Encounter (Signed)
Called patient to discuss holding Plavix. He reports that he will not have teeth extraction right now. He apparently has a "place in his mouth" that needs biopsy.  Once that is completed, he will move forward with dental extraction.   [facility] will need to send a new request once he is scheduled.   Joni Reining DNP, ANP, [place]

## 2020-05-22 ENCOUNTER — Other Ambulatory Visit: Payer: Self-pay | Admitting: Interventional Cardiology

## 2020-07-26 ENCOUNTER — Other Ambulatory Visit: Payer: Self-pay

## 2020-07-26 ENCOUNTER — Telehealth: Payer: Self-pay | Admitting: Physician Assistant

## 2020-07-26 ENCOUNTER — Other Ambulatory Visit: Payer: Self-pay | Admitting: Physician Assistant

## 2020-07-26 MED ORDER — CLOPIDOGREL BISULFATE 75 MG PO TABS: 75.0000 mg | ORAL_TABLET | Freq: Every day | ORAL | 2 refills | Status: DC

## 2020-07-26 NOTE — Telephone Encounter (Signed)
*  STAT* If patient is at the pharmacy, call can be transferred to refill team.   1. Which medications need to be refilled? (please list name of each medication and dose if known)  clopidogrel (PLAVIX) 75 MG tablet  2. Which pharmacy/location (including street and city if local pharmacy) is medication to be sent to? [facility] - [place] - [address]  3. Do they need a 30 day or 90 day supply? 90 day supply  Refill request is already in system, but patient is requesting it be sent to the pharmacy now due to having an appt and wanting to pick it up otw home due to limited transportation.

## 2020-09-25 NOTE — Progress Notes (Signed)
Cardiology Office Note:    Date:  09/26/2020   ID:  [name], DOB 30-Apr-1956, MRN [id number]  PCP:  Eyvonne Mechanic, MD  Cardiologist:  Sinclair Grooms, MD   Referring MD: Eyvonne Mechanic, MD   Chief Complaint  Patient presents with  . Coronary Artery Disease  . Shortness of Breath    History of Present Illness:    Omar Sandoval is a 65 y.o. male with a hx of HIV, hypertension, HLD, PVCs, chronic combined systolic and diastolic CHF. Also has CAD status post inferior STEMI 01/2017 treated with DES to the RCA with residual 70% mid LAD and 60 to 70% distal circumflex EF 40 to 50% at that time. Echo after cath 02/06/2017 EF 45 to 50%.  Omar Sandoval is having episodes where he feels breathless.  These can occur randomly.  He denies orthopnea.  He seems to be getting around and not having difficulty.  He does not smoke cigarettes.  He admits that he smokes "blunts".  He has been smoking today.  He does not have peripheral edema.  He denies anginal quality chest pain.  Past Medical History:  Diagnosis Date  . HIV (human immunodeficiency virus infection) ([place])   . Immune deficiency disorder ([place])   . Inguinal hernia    Left  . Myocardial infarction [facility])    01-2017 angioplasty with stents    Past Surgical History:  Procedure Laterality Date  . COLONOSCOPY    . CORONARY ANGIOPLASTY    . CORONARY/GRAFT ACUTE MI REVASCULARIZATION N/A 02/03/2017   Procedure: Coronary/Graft Acute MI Revascularization;  Surgeon: Belva Crome, MD;  Location: [place] [facility];  Service: Cardiovascular;  Laterality: N/A;  . FRACTURE SURGERY     left ankle and right foot  . HERNIA REPAIR    . INGUINAL HERNIA REPAIR Left 08/27/2017   Procedure: LAPAROSCOPIC LEFT INGUINAL HERNIA REPAIR WITH MESH;  Surgeon: Ralene Ok, MD;  Location: [place];  Service: General;  Laterality: Left;  . INSERTION OF MESH Left 08/27/2017   Procedure: INSERTION OF MESH;  Surgeon: Ralene Ok, MD;  Location: [place];  Service:  General;  Laterality: Left;  . LEFT HEART CATH AND CORONARY ANGIOGRAPHY N/A 02/03/2017   Procedure: Left Heart Cath and Coronary Angiography;  Surgeon: Belva Crome, MD;  Location: [place] [facility];  Service: Cardiovascular;  Laterality: N/A;  . LEFT HEART CATH AND CORONARY ANGIOGRAPHY N/A 09/25/2018   Procedure: LEFT HEART CATH AND CORONARY ANGIOGRAPHY;  Surgeon: Belva Crome, MD;  Location: [place] [facility];  Service: Cardiovascular;  Laterality: N/A;  . OPEN REDUCTION INTERNAL FIXATION (ORIF) DISTAL RADIAL FRACTURE Right 07/20/2016   Procedure: OPEN REDUCTION INTERNAL FIXATION (ORIF) DISTAL RADIAL FRACTURE;  Surgeon: Iran Planas, MD;  Location: [place];  Service: Orthopedics;  Laterality: Right;  . SURGERY SCROTAL / TESTICULAR     nondecended testes    Current Medications: Current Meds  Medication Sig  . atorvastatin (LIPITOR) 40 MG tablet TAKE 1 TABLET BY MOUTH ONCE DAILY AT  6PM.  . clopidogrel (PLAVIX) 75 MG tablet Take 1 tablet (75 mg total) by mouth daily.  . Dolutegravir-lamiVUDine 50-300 MG TABS Take 1 tablet by mouth daily.   . metoprolol succinate (TOPROL-XL) 25 MG 24 hr tablet Take 1/2 (one-half) tablet by mouth twice daily  . nitroGLYCERIN (NITROSTAT) 0.4 MG SL tablet Place 1 tablet (0.4 mg total) under the tongue every 5 (five) minutes x 3 doses as needed for chest pain.  [name] oxyCODONE-acetaminophen (  PERCOCET) 10-325 MG tablet Take 0.5-1 tablets by mouth every 4 (four) hours as needed for pain.     Allergies:   Chocolate, Lac bovis, Lactose intolerance (gi), and Vicodin [hydrocodone-acetaminophen]   Social History   Socioeconomic History  . Marital status: Single    Spouse name: Not on file  . Number of children: Not on file  . Years of education: Not on file  . Highest education level: Not on file  Occupational History  . Not on file  Tobacco Use  . Smoking status: Former Smoker    Packs/day: 0.50    Years: 45.00    Pack years: 22.50    Types: Cigarettes     Quit date: 2013    Years since quitting: 9.0  . Smokeless tobacco: Never Used  Vaping Use  . Vaping Use: Never used  Substance and Sexual Activity  . Alcohol use: Yes    Alcohol/week: 3.0 standard drinks    Types: 3 Cans of beer per week    Comment: beer daily  . Drug use: Yes    Types: Marijuana    Comment: used  Marijuana  . Sexual activity: Not on file  Other Topics Concern  . Not on file  Social History Narrative  . Not on file   Social Determinants of Health   Financial Resource Strain: Not on file  Food Insecurity: Not on file  Transportation Needs: Not on file  Physical Activity: Not on file  Stress: Not on file  Social Connections: Not on file     Family History: The patient's family history includes Cancer in his brother and mother.  ROS:   Please see the history of present illness.    Not have blood in his urine or stool.  His boyfriend accompanied him here today.  He has not had blood work in quite some time.  All other systems reviewed and are negative.  EKGs/Labs/Other Studies Reviewed:    The following studies were reviewed today: Last echo was 2018  EKG:  EKG EKG is not performed today  Recent Labs: No results found for requested labs within last 8760 hours.  Recent Lipid Panel    Component Value Date/Time   CHOL 126 07/08/2018 0901   TRIG 43 07/08/2018 0901   HDL 46 07/08/2018 0901   CHOLHDL 2.7 07/08/2018 0901   CHOLHDL 3.3 02/03/2017 1323   VLDL 8 02/03/2017 1323   LDLCALC 71 07/08/2018 0901    Physical Exam:    VS:  BP 108/68   Pulse 84   Ht $R'5\' 8"'hx$  (1.727 m)   Wt 194 lb 9.6 oz (88.3 kg)   SpO2 97%   BMI 29.59 kg/m     Wt Readings from Last 3 Encounters:  09/26/20 194 lb 9.6 oz (88.3 kg)  01/19/20 194 lb (88 kg)  04/23/19 192 lb (87.1 kg)     GEN: Obese with BMI 30. No acute distress HEENT: Normal NECK: No JVD. LYMPHATICS: No lymphadenopathy CARDIAC: No murmur. RRR no gallop, or edema. VASCULAR:  Normal Pulses. No  bruits. RESPIRATORY:  Clear to auscultation without rales, wheezing or rhonchi  ABDOMEN: Soft, non-tender, non-distended, No pulsatile mass, MUSCULOSKELETAL: No deformity  SKIN: Warm and dry NEUROLOGIC:  Alert and oriented x 3 PSYCHIATRIC:  Normal affect   ASSESSMENT:    1. Coronary artery disease involving native coronary artery of native heart with angina pectoris ([place])   2. Chronic combined systolic and diastolic heart failure ([place])   3. Hyperlipidemia, unspecified hyperlipidemia  type   4. Essential hypertension   5. OSA on CPAP   6. HIV infection, unspecified symptom status ([place])   7. Educated about COVID-19 virus infection   8. Premature ventricular contractions    PLAN:    In order of problems listed above:  1. Secondary prevention discussed. 2. 2D Doppler echocardiogram to assess systolic function.  Last performed in 2018. 3. Lipid panel fasting along with c-Met and hemoglobin A1c.  Continue Lipitor 40 mg/day. 4. Blood pressure is excellent.  Continue Toprol-XL. 5. Encouraged use of CPAP. 6. Did not discuss HIV.  He gets screening for biopsies frequently.   Medication Adjustments/Labs and Tests Ordered: Current medicines are reviewed at length with the patient today.  Concerns regarding medicines are outlined above.  No orders of the defined types were placed in this encounter.  No orders of the defined types were placed in this encounter.   There are no Patient Instructions on file for this visit.   Signed, Sinclair Grooms, MD  09/26/2020 5:10 PM    [place]

## 2020-09-26 ENCOUNTER — Encounter: Payer: Self-pay | Admitting: Interventional Cardiology

## 2020-09-26 ENCOUNTER — Other Ambulatory Visit: Payer: Self-pay

## 2020-09-26 ENCOUNTER — Ambulatory Visit (INDEPENDENT_AMBULATORY_CARE_PROVIDER_SITE_OTHER): Payer: Medicare Other | Admitting: Interventional Cardiology

## 2020-09-26 VITALS — BP 108/68 | HR 84 | Ht 68.0 in | Wt 194.6 lb

## 2020-09-26 DIAGNOSIS — G4733 Obstructive sleep apnea (adult) (pediatric): Secondary | ICD-10-CM

## 2020-09-26 DIAGNOSIS — I5042 Chronic combined systolic (congestive) and diastolic (congestive) heart failure: Secondary | ICD-10-CM

## 2020-09-26 DIAGNOSIS — Z9989 Dependence on other enabling machines and devices: Secondary | ICD-10-CM

## 2020-09-26 DIAGNOSIS — I493 Ventricular premature depolarization: Secondary | ICD-10-CM

## 2020-09-26 DIAGNOSIS — I25119 Atherosclerotic heart disease of native coronary artery with unspecified angina pectoris: Secondary | ICD-10-CM

## 2020-09-26 DIAGNOSIS — E785 Hyperlipidemia, unspecified: Secondary | ICD-10-CM | POA: Diagnosis not present

## 2020-09-26 DIAGNOSIS — I10 Essential (primary) hypertension: Secondary | ICD-10-CM

## 2020-09-26 DIAGNOSIS — Z7189 Other specified counseling: Secondary | ICD-10-CM

## 2020-09-26 DIAGNOSIS — B20 Human immunodeficiency virus [HIV] disease: Secondary | ICD-10-CM

## 2020-09-26 NOTE — Patient Instructions (Signed)
Medication Instructions:  Your physician recommends that you continue on your current medications as directed. Please refer to the Current Medication list given to you today.  *If you need a refill on your cardiac medications before your next appointment, please call your pharmacy*   Lab Work: Lipid, Liver, BMET and A1C when you are fasting (nothing to eat or drink after midnight except water and black coffee).  If you have labs (blood work) drawn today and your tests are completely normal, you will receive your results only by: [name] MyChart Message (if you have MyChart) OR . A paper copy in the mail If you have any lab test that is abnormal or we need to change your treatment, we will call you to review the results.   Testing/Procedures: Your physician has requested that you have an echocardiogram. Echocardiography is a painless test that uses sound waves to create images of your heart. It provides your doctor with information about the size and shape of your heart and how well your heart's chambers and valves are working. This procedure takes approximately one hour. There are no restrictions for this procedure.    Follow-Up: At [facility], you and your health needs are our priority.  As part of our continuing mission to provide you with exceptional heart care, we have created designated Provider Care Teams.  These Care Teams include your primary Cardiologist (physician) and Advanced Practice Providers (APPs -  Physician Assistants and Nurse Practitioners) who all work together to provide you with the care you need, when you need it.  We recommend signing up for the patient portal called "MyChart".  Sign up information is provided on this After Visit Summary.  MyChart is used to connect with patients for Virtual Visits (Telemedicine).  Patients are able to view lab/test results, encounter notes, upcoming appointments, etc.  Non-urgent messages can be sent to your provider as well.   To  learn more about what you can do with MyChart, go to [email or web address].    Your next appointment:   9 month(s)  The format for your next appointment:   In Person  Provider:   You may see Lesleigh Noe, MD or one of the following Advanced Practice Providers on your designated Care Team:    Georgie Chard, NP    Other Instructions

## 2020-10-20 ENCOUNTER — Ambulatory Visit (HOSPITAL_COMMUNITY): Payer: Medicare Other | Attending: Internal Medicine

## 2020-10-20 ENCOUNTER — Other Ambulatory Visit: Payer: Self-pay

## 2020-10-20 ENCOUNTER — Other Ambulatory Visit: Payer: Medicare Other | Admitting: *Deleted

## 2020-10-20 DIAGNOSIS — E785 Hyperlipidemia, unspecified: Secondary | ICD-10-CM

## 2020-10-20 DIAGNOSIS — I25119 Atherosclerotic heart disease of native coronary artery with unspecified angina pectoris: Secondary | ICD-10-CM

## 2020-10-20 DIAGNOSIS — I5042 Chronic combined systolic (congestive) and diastolic (congestive) heart failure: Secondary | ICD-10-CM | POA: Diagnosis not present

## 2020-10-21 LAB — HEMOGLOBIN A1C
Est. average glucose Bld gHb Est-mCnc: 111 mg/dL
Hgb A1c MFr Bld: 5.5 % (ref 4.8–5.6)

## 2020-10-21 LAB — LIPID PANEL
Chol/HDL Ratio: 2 ratio (ref 0.0–5.0)
Cholesterol, Total: 134 mg/dL (ref 100–199)
HDL: 66 mg/dL (ref >39)
LDL Chol Calc (NIH): 58 mg/dL (ref 0–99)
Triglycerides: 43 mg/dL (ref 0–149)
VLDL Cholesterol Cal: 10 mg/dL (ref 5–40)

## 2020-10-21 LAB — HEPATIC FUNCTION PANEL
ALT: 43 IU/L (ref 0–44)
AST: 46 IU/L — ABNORMAL HIGH (ref 0–40)
Albumin: 4.2 g/dL (ref 3.8–4.8)
Alkaline Phosphatase: 83 IU/L (ref 44–121)
Bilirubin Total: 0.3 mg/dL (ref 0.0–1.2)
Bilirubin, Direct: 0.14 mg/dL (ref 0.00–0.40)
Total Protein: 6.6 g/dL (ref 6.0–8.5)

## 2020-10-21 LAB — BASIC METABOLIC PANEL
BUN/Creatinine Ratio: 16 (ref 10–24)
BUN: 11 mg/dL (ref 8–27)
CO2: 22 mmol/L (ref 20–29)
Calcium: 9.1 mg/dL (ref 8.6–10.2)
Chloride: 104 mmol/L (ref 96–106)
Creatinine, Ser: 0.7 mg/dL — ABNORMAL LOW (ref 0.76–1.27)
GFR calc Af Amer: 115 mL/min/{1.73_m2} (ref >59)
GFR calc non Af Amer: 100 mL/min/{1.73_m2} (ref >59)
Glucose: 73 mg/dL (ref 65–99)
Potassium: 4.2 mmol/L (ref 3.5–5.2)
Sodium: 144 mmol/L (ref 134–144)

## 2020-10-21 LAB — ECHOCARDIOGRAM COMPLETE
Area-P 1/2: 2.12 cm2
S' Lateral: 3.5 cm

## 2020-11-16 ENCOUNTER — Other Ambulatory Visit: Payer: Self-pay | Admitting: Interventional Cardiology

## 2020-11-28 ENCOUNTER — Encounter (HOSPITAL_COMMUNITY): Payer: Self-pay

## 2020-11-28 ENCOUNTER — Emergency Department (HOSPITAL_COMMUNITY): Payer: Medicare Other

## 2020-11-28 ENCOUNTER — Emergency Department (HOSPITAL_COMMUNITY)
Admission: EM | Admit: 2020-11-28 | Discharge: 2020-11-29 | Disposition: A | Discharge status: Home / Self Care | Payer: Medicare Other | Attending: Emergency Medicine | Admitting: Emergency Medicine

## 2020-11-28 ENCOUNTER — Other Ambulatory Visit: Payer: Self-pay

## 2020-11-28 DIAGNOSIS — R072 Precordial pain: Secondary | ICD-10-CM | POA: Insufficient documentation

## 2020-11-28 DIAGNOSIS — R0602 Shortness of breath: Secondary | ICD-10-CM | POA: Insufficient documentation

## 2020-11-28 DIAGNOSIS — I2510 Atherosclerotic heart disease of native coronary artery without angina pectoris: Secondary | ICD-10-CM | POA: Diagnosis not present

## 2020-11-28 DIAGNOSIS — Z21 Asymptomatic human immunodeficiency virus [HIV] infection status: Secondary | ICD-10-CM | POA: Diagnosis not present

## 2020-11-28 DIAGNOSIS — Z79899 Other long term (current) drug therapy: Secondary | ICD-10-CM | POA: Insufficient documentation

## 2020-11-28 DIAGNOSIS — I110 Hypertensive heart disease with heart failure: Secondary | ICD-10-CM | POA: Diagnosis not present

## 2020-11-28 DIAGNOSIS — I5042 Chronic combined systolic (congestive) and diastolic (congestive) heart failure: Secondary | ICD-10-CM | POA: Insufficient documentation

## 2020-11-28 DIAGNOSIS — Z87891 Personal history of nicotine dependence: Secondary | ICD-10-CM | POA: Diagnosis not present

## 2020-11-28 DIAGNOSIS — Z8679 Personal history of other diseases of the circulatory system: Secondary | ICD-10-CM

## 2020-11-28 DIAGNOSIS — R079 Chest pain, unspecified: Secondary | ICD-10-CM

## 2020-11-28 LAB — CBC WITH DIFFERENTIAL/PLATELET
Abs Immature Granulocytes: 0.05 10*3/uL (ref 0.00–0.07)
Basophils Absolute: 0 10*3/uL (ref 0.0–0.1)
Basophils Relative: 0 %
Eosinophils Absolute: 0 10*3/uL (ref 0.0–0.5)
Eosinophils Relative: 0 %
HCT: 38.3 % — ABNORMAL LOW (ref 39.0–52.0)
Hemoglobin: 13.1 g/dL (ref 13.0–17.0)
Immature Granulocytes: 1 %
Lymphocytes Relative: 19 %
Lymphs Abs: 2 10*3/uL (ref 0.7–4.0)
MCH: 35.2 pg — ABNORMAL HIGH (ref 26.0–34.0)
MCHC: 34.2 g/dL (ref 30.0–36.0)
MCV: 103 fL — ABNORMAL HIGH (ref 80.0–100.0)
Monocytes Absolute: 1.1 10*3/uL — ABNORMAL HIGH (ref 0.1–1.0)
Monocytes Relative: 11 %
Neutro Abs: 7.4 10*3/uL (ref 1.7–7.7)
Neutrophils Relative %: 69 %
Platelets: 296 10*3/uL (ref 150–400)
RBC: 3.72 MIL/uL — ABNORMAL LOW (ref 4.22–5.81)
RDW: 14.2 % (ref 11.5–15.5)
WBC: 10.5 10*3/uL (ref 4.0–10.5)
nRBC: 0 % (ref 0.0–0.2)

## 2020-11-28 LAB — BASIC METABOLIC PANEL
Anion gap: 7 (ref 5–15)
BUN: 12 mg/dL (ref 8–23)
CO2: 27 mmol/L (ref 22–32)
Calcium: 9.1 mg/dL (ref 8.9–10.3)
Chloride: 102 mmol/L (ref 98–111)
Creatinine, Ser: 1.1 mg/dL (ref 0.61–1.24)
GFR, Estimated: >60 mL/min (ref >60)
Glucose, Bld: 103 mg/dL — ABNORMAL HIGH (ref 70–99)
Potassium: 3.8 mmol/L (ref 3.5–5.1)
Sodium: 136 mmol/L (ref 135–145)

## 2020-11-28 LAB — TROPONIN I (HIGH SENSITIVITY): Troponin I (High Sensitivity): 16 ng/L (ref <18)

## 2020-11-28 MED ORDER — ASPIRIN 81 MG PO CHEW
324.0000 mg | CHEWABLE_TABLET | Freq: Once | ORAL | Status: DC
  Filled 2020-11-28: qty 4

## 2020-11-28 NOTE — ED Triage Notes (Signed)
Chest pain while walking the dog all across the chest and both shoulders. Denies any dizziness, pain radiating down the arm, and jaw pain.

## 2020-11-28 NOTE — ED Triage Notes (Signed)
Emergency Medicine Provider Triage Evaluation Note  Omar Sandoval , a 65 y.o. male  was evaluated in triage.  Pt complains of constant chest pain since 4:30PM this afternoon. Chest pain started while walking his dog. History of previous MI. Chest pain located throughout entire anterior chest associated with shortness of breath. No nausea, vomiting, or diaphoresis.   Review of Systems  Positive: Chest pain, shortness of breath   Physical Exam  BP 112/69 (BP Location: Right Arm)   Pulse 86   Temp 99 F (37.2 C) (Oral)   Resp 16   Ht 5\' 8"  (1.727 m)   Wt 88 kg   SpO2 94%   BMI 29.50 kg/m  Gen:   Awake, no distress   HEENT:  Atraumatic  Resp:  Normal effort  Cardiac:  Normal rate  Abd:   Nondistended, nontender  MSK:   Moves extremities without difficulty  Neuro:  Speech clear   Medical Decision Making  Medically screening exam initiated at 9:25 PM.  Appropriate orders placed.  AERON DONAGHEY was informed that the remainder of the evaluation will be completed by another provider, this initial triage assessment does not replace that evaluation, and the importance of remaining in the ED until their evaluation is complete.  Clinical Impression  Chest pain started earlier today. History of MI. Cardiac labs to rule out ACS ordered. ASA given at triage.    [name], Mannie Stabile 11/28/20 2129

## 2020-11-29 ENCOUNTER — Telehealth: Payer: Self-pay | Admitting: Interventional Cardiology

## 2020-11-29 LAB — TROPONIN I (HIGH SENSITIVITY): Troponin I (High Sensitivity): 14 ng/L (ref <18)

## 2020-11-29 NOTE — Telephone Encounter (Signed)
Pt c/o of Chest Pain: STAT if CP now or developed within 24 hours  1. Are you having CP right now? yes  2. Are you experiencing any other symptoms (ex. SOB, nausea, vomiting, sweating)? Sob, sweating   3. How long have you been experiencing CP? Since yesterday at 430  4. Is your CP continuous or coming and going? continuous  5. Have you taken Nitroglycerin? no ?

## 2020-11-29 NOTE — ED Provider Notes (Signed)
[facility]   CSN: [id number] Arrival date & time: 11/28/20  2111     History Chief Complaint  Patient presents with  . Chest Pain  . Shortness of Breath    Omar Sandoval is a 65 y.o. male.  Patient is a [age] year old male with past medical history of HIV disease, coronary artery disease with stent, ischemic cardiomyopathy.  Patient presents today for evaluation of chest discomfort.  This started at approximately 430 this afternoon while he was walking his dog.  Patient describes a sharp pain to the center of his chest that was associated with feeling short of breath, but no nausea, diaphoresis, or radiation to the arm or jaw.  This lasted for approximately 4 hours, then began to resolve shortly after he arrived to the emergency department.  Patient has nitroglycerin at home, however has not taken this.  Patient's cardiology is Dr. Katrinka BlazingSmith.  Patient had a stent placed in 2018 in his right coronary artery.  He was recatheterized in 2020 and found to have 40 to 50% left main disease and 30% in-stent stenosis to the right coronary artery along with 70% blockage of the obtuse marginal.  Medical therapy was deemed appropriate at that time with possible CABG in consideration if patient has further anginal symptoms.  The history is provided by the patient.  Chest Pain Pain location:  Substernal area Pain quality: sharp   Pain radiates to:  Does not radiate Pain severity:  Moderate Onset quality:  Sudden Duration:  4 hours Timing:  Constant Progression:  Resolved Chronicity:  New Context comment:  Walking the dog Relieved by:  Nothing Worsened by:  Nothing Associated symptoms: shortness of breath   Shortness of Breath Associated symptoms: chest pain        Past Medical History:  Diagnosis Date  . HIV (human immunodeficiency virus infection) ([facility])   . Immune deficiency disorder ([facility])   . Inguinal hernia    Left  . Myocardial  infarction [facility](HCC)    01-2017 angioplasty with stents    Patient Active Problem List   Diagnosis Date Noted  . Essential hypertension 02/08/2019  . OSA on CPAP 02/08/2019  . Cardiomyopathy, ischemic 02/08/2019  . Left inguinal hernia 07/14/2017  . Chronic combined systolic and diastolic heart failure ([facility]) 02/28/2017  . Hyperlipidemia 02/06/2017  . Coronary artery disease involving native coronary artery of native heart with angina pectoris ([facility]) 02/06/2017  . 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 HIV disease ([facility]) 01/06/2013  . 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 COLONOSCOPY    . CORONARY ANGIOPLASTY    . CORONARY/GRAFT ACUTE MI REVASCULARIZATION N/A 02/03/2017   Procedure: Coronary/Graft Acute MI Revascularization;  Surgeon: Lyn RecordsSmith, Henry W, MD;  [facility];  Service: Cardiovascular;  Laterality: N/A;  . FRACTURE SURGERY     left ankle and right foot  . HERNIA REPAIR    . INGUINAL HERNIA REPAIR Left 08/27/2017   Procedure: LAPAROSCOPIC LEFT INGUINAL HERNIA REPAIR WITH MESH;  Surgeon: Axel Filleramirez, Armando, MD;  [facility];  Service: General;  Laterality: Left;  . INSERTION OF MESH Left 08/27/2017   Procedure: INSERTION OF MESH;  Surgeon: Axel Filleramirez, Armando, MD;  [facility];  Service: General;  Laterality: Left;  . LEFT HEART CATH AND CORONARY ANGIOGRAPHY N/A 02/03/2017   Procedure: Left Heart Cath and Coronary Angiography;  Surgeon: Lyn RecordsSmith, Henry W, MD;  [facility];  Service: Cardiovascular;  Laterality: N/A;  . LEFT HEART CATH AND CORONARY ANGIOGRAPHY N/A 09/25/2018   Procedure: LEFT HEART CATH AND CORONARY ANGIOGRAPHY;  Surgeon: [name], MD;  [facility];  Service: Cardiovascular;  Laterality: N/A;  . OPEN REDUCTION INTERNAL FIXATION (ORIF) DISTAL RADIAL FRACTURE Right 07/20/2016   Procedure: OPEN REDUCTION INTERNAL FIXATION (ORIF) DISTAL RADIAL FRACTURE;  Surgeon:  [name], MD;  [facility];  Service: Orthopedics;  Laterality: Right;  . SURGERY SCROTAL / TESTICULAR     nondecended testes       Family History  Problem Relation Age of Onset  . Cancer Mother        lung  . Cancer Brother        lung, liver, and colon    Social History   Tobacco Use  . Smoking status: Former Smoker    Packs/day: 0.50    Years: 45.00    Pack years: 22.50    Types: Cigarettes    Quit date: 2013    Years since quitting: 9.2  . Smokeless tobacco: Never Used  Vaping Use  . Vaping Use: Never used  Substance Use Topics  . Alcohol use: Yes    Alcohol/week: 3.0 standard drinks    Types: 3 Cans of beer per week    Comment: beer daily  . Drug use: Yes    Types: Marijuana    Comment: used  Marijuana    Home Medications Prior to Admission medications   Medication Sig Start Date End Date Taking? Authorizing Provider  atorvastatin (LIPITOR) 40 MG tablet TAKE 1 TABLET BY MOUTH ONCE DAILY AT 6PM 11/16/20   [name], MD  clopidogrel (PLAVIX) 75 MG tablet Take 1 tablet (75 mg total) by mouth daily. 07/26/20   [name], MD  Dolutegravir-lamiVUDine 50-300 MG TABS Take 1 tablet by mouth daily.  06/03/18   [provider]  metoprolol succinate (TOPROL-XL) 25 MG 24 hr tablet Take 1/2 (one-half) tablet by mouth twice daily 05/22/20   [name], MD  nitroGLYCERIN (NITROSTAT) 0.4 MG SL tablet Place 1 tablet (0.4 mg total) under the tongue every 5 (five) minutes x 3 doses as needed for chest pain. 03/19/18   [name], MD  oxyCODONE-acetaminophen (PERCOCET) 10-325 MG tablet Take 0.5-1 tablets by mouth every 4 (four) hours as needed for pain.    [provider]    Allergies    Chocolate, Lac bovis, Lactose intolerance (gi), and Vicodin [hydrocodone-acetaminophen]  Review of Systems   Review of Systems  Respiratory: Positive for shortness of breath.   Cardiovascular: Positive for chest pain.  All other systems reviewed and are  negative.   Physical Exam Updated Vital Signs BP 114/81   Pulse 74   Temp 98.4 F (36.9 C) (Oral)   Resp 20   Ht 5\' 8"  (1.727 m)   Wt 88 kg   SpO2 93%   BMI 29.50 kg/m   Physical Exam Vitals and nursing note reviewed.  Constitutional:      General: He is not in acute distress.    Appearance: He is well-developed. He is not diaphoretic.  HENT:     Head: Normocephalic and atraumatic.  Cardiovascular:     Rate and Rhythm: Normal rate and regular rhythm.     Heart sounds: No murmur heard. No friction rub.  Pulmonary:     Effort: Pulmonary effort is normal. No respiratory distress.  Breath sounds: Normal breath sounds. No wheezing or rales.  Abdominal:     General: Bowel sounds are normal. There is no distension.     Palpations: Abdomen is soft.     Tenderness: There is no abdominal tenderness.  Musculoskeletal:        General: Normal range of motion.     Cervical back: Normal range of motion and neck supple.     Right lower leg: No tenderness. No edema.     Left lower leg: No tenderness. No edema.  Skin:    General: Skin is warm and dry.  Neurological:     Mental Status: He is alert and oriented to person, place, and time.     Coordination: Coordination normal.     ED Results / Procedures / Treatments   Labs (all labs ordered are listed, but only abnormal results are displayed) Labs Reviewed  CBC WITH DIFFERENTIAL/PLATELET - Abnormal; Notable for the following components:      Result Value   RBC 3.72 (*)    HCT 38.3 (*)    MCV 103.0 (*)    MCH 35.2 (*)    Monocytes Absolute 1.1 (*)    All other components within normal limits  BASIC METABOLIC PANEL - Abnormal; Notable for the following components:   Glucose, Bld 103 (*)    All other components within normal limits  TROPONIN I (HIGH SENSITIVITY)  TROPONIN I (HIGH SENSITIVITY)    EKG EKG Interpretation  Date/Time:  Tuesday November 28 2020 23:24:49 EDT Ventricular Rate:  72 PR Interval:  140 QRS  Duration: 139 QT Interval:  404 QTC Calculation: 443 R Axis:   -37 Text Interpretation: Sinus rhythm Right bundle branch block Inferior infarct, age indeterminate Probable anterolateral infarct, old Confirmed by Geoffery Lyons ([id number]) on 11/29/2020 12:36:58 AM   Radiology DG Chest 1 View  Result Date: 11/28/2020 CLINICAL DATA:  Chest pain and shortness of breath since today. Former smoker. EXAM: CHEST  1 VIEW COMPARISON:  09/29/2018 FINDINGS: Heart size and pulmonary vascularity are normal. Lungs are clear. No pleural effusions. No pneumothorax. Mediastinal contours appear intact. Old bilateral rib fractures. IMPRESSION: No active disease. Electronically Signed   By: Burman Nieves M.D.   On: 11/28/2020 21:43    Procedures Procedures   Medications Ordered in ED Medications  aspirin chewable tablet 324 mg (324 mg Oral Patient Refused/Not Given 11/28/20 2146)    ED Course  I have reviewed the triage vital signs and the nursing notes.  Pertinent labs & imaging results that were available during my care of the patient were reviewed by me and considered in my medical decision making (see chart for details).    MDM Rules/Calculators/A&P  Patient with history of coronary artery disease presenting with complaints of chest discomfort that began while walking the dog.  He described a sharp pain in the center of his chest with no other symptoms.  This lasted for several hours, then resolved.  He describes this as different than what he experienced with his prior cardiac symptoms.  Patient's work-up shows negative troponin x2 and unchanged EKG.  Patient has been observed here for several hours and would prefer to go home over admission.  I have advised him to perform no strenuous activity and follow-up with his cardiologist in the next 2 to 3 days.  Final Clinical Impression(s) / ED Diagnoses Final diagnoses:  None    Rx / DC Orders ED Discharge Orders    None  Geoffery Lyons,  MD 11/29/20 [phone]
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
## 2020-11-29 NOTE — ED Notes (Signed)
Patient verbalizes understanding of discharge instructions. Opportunity for questioning and answers were provided. Armband removed by staff, pt discharged from ED ambulatory. Pt states he is calling a cab to get home.

## 2020-11-29 NOTE — Discharge Instructions (Addendum)
Continue medications as previously prescribed.  No strenuous activity until followed up by your cardiologist.  You should call in the morning to arrange an appointment with Dr. Katrinka Blazing in the next 2 to 3 days.  Return to the emergency department if you develop worsening chest pain, difficulty breathing, or other new and concerning symptoms.

## 2020-11-30 NOTE — Telephone Encounter (Signed)
Spoke with pt and made him aware of recommendations from Dr. Katrinka Blazing.  Pt states he feeling better today.  Advised pt call back if he has to use Nitro.

## 2020-11-30 NOTE — Telephone Encounter (Signed)
Data from emergency room was reviewed. Continue to monitor for any recurrent episodes of pain.  Let us know.  Use nitroglycerin if pain recurs.  Give [place] an update if you have to use nitroglycerin.

## 2021-02-14 ENCOUNTER — Other Ambulatory Visit: Payer: Self-pay | Admitting: Interventional Cardiology

## 2021-04-18 ENCOUNTER — Other Ambulatory Visit: Payer: Self-pay | Admitting: Interventional Cardiology

## 2021-04-24 DIAGNOSIS — K219 Gastro-esophageal reflux disease without esophagitis: Secondary | ICD-10-CM | POA: Insufficient documentation

## 2021-06-04 ENCOUNTER — Emergency Department (HOSPITAL_COMMUNITY)
Admission: EM | Admit: 2021-06-04 | Discharge: 2021-06-05 | Disposition: A | Discharge status: Home / Self Care | Payer: Medicare Other | Attending: Emergency Medicine | Admitting: Emergency Medicine

## 2021-06-04 ENCOUNTER — Other Ambulatory Visit: Payer: Self-pay

## 2021-06-04 DIAGNOSIS — M25552 Pain in left hip: Secondary | ICD-10-CM | POA: Diagnosis not present

## 2021-06-04 DIAGNOSIS — M25562 Pain in left knee: Secondary | ICD-10-CM | POA: Insufficient documentation

## 2021-06-04 DIAGNOSIS — Y9289 Other specified places as the place of occurrence of the external cause: Secondary | ICD-10-CM | POA: Insufficient documentation

## 2021-06-04 DIAGNOSIS — Z7902 Long term (current) use of antithrombotics/antiplatelets: Secondary | ICD-10-CM | POA: Insufficient documentation

## 2021-06-04 DIAGNOSIS — Z79899 Other long term (current) drug therapy: Secondary | ICD-10-CM | POA: Insufficient documentation

## 2021-06-04 DIAGNOSIS — Z21 Asymptomatic human immunodeficiency virus [HIV] infection status: Secondary | ICD-10-CM | POA: Diagnosis not present

## 2021-06-04 DIAGNOSIS — I25119 Atherosclerotic heart disease of native coronary artery with unspecified angina pectoris: Secondary | ICD-10-CM | POA: Diagnosis not present

## 2021-06-04 DIAGNOSIS — M25551 Pain in right hip: Secondary | ICD-10-CM | POA: Insufficient documentation

## 2021-06-04 DIAGNOSIS — Z955 Presence of coronary angioplasty implant and graft: Secondary | ICD-10-CM | POA: Insufficient documentation

## 2021-06-04 DIAGNOSIS — W19XXXA Unspecified fall, initial encounter: Secondary | ICD-10-CM

## 2021-06-04 DIAGNOSIS — W010XXA Fall on same level from slipping, tripping and stumbling without subsequent striking against object, initial encounter: Secondary | ICD-10-CM | POA: Insufficient documentation

## 2021-06-04 DIAGNOSIS — I5042 Chronic combined systolic (congestive) and diastolic (congestive) heart failure: Secondary | ICD-10-CM | POA: Insufficient documentation

## 2021-06-04 DIAGNOSIS — I110 Hypertensive heart disease with heart failure: Secondary | ICD-10-CM | POA: Insufficient documentation

## 2021-06-04 DIAGNOSIS — M25561 Pain in right knee: Secondary | ICD-10-CM | POA: Diagnosis present

## 2021-06-04 DIAGNOSIS — Z87891 Personal history of nicotine dependence: Secondary | ICD-10-CM | POA: Insufficient documentation

## 2021-06-05 ENCOUNTER — Emergency Department (HOSPITAL_COMMUNITY): Payer: Medicare Other

## 2021-06-05 ENCOUNTER — Other Ambulatory Visit: Payer: Self-pay

## 2021-06-05 ENCOUNTER — Encounter (HOSPITAL_COMMUNITY): Payer: Self-pay | Admitting: Emergency Medicine

## 2021-06-05 DIAGNOSIS — M25561 Pain in right knee: Secondary | ICD-10-CM | POA: Diagnosis not present

## 2021-06-05 NOTE — Discharge Instructions (Signed)
Take Tylenol or ibuprofen for pain.  Return to the ED with any new or concerning symptoms.

## 2021-06-05 NOTE — ED Triage Notes (Addendum)
Patient stepped on a donut and slipped/fell while at work this evening , denies LOC , reports pain at bilateral knees , hips and lower back .

## 2021-06-05 NOTE — ED Provider Notes (Signed)
MSE was initiated and I personally evaluated the patient and placed orders (if any) at  12:38 AM on June 05, 2021.  Fall at work after slipping. Went down on both knees causing pain in knees and bilateral hips. Able to ambulate. Required to come in by employer.   Today's Vitals   06/05/21 0015 06/05/21 0015  BP:  (!) 160/105  Pulse:  79  Resp:  16  SpO2:  100%  Weight: 85 kg   Height: 5\' 6"  (1.676 m)   PainSc: 8     Body mass index is 30.25 kg/m.  Moves all joints Exam limited in triage  The patient appears stable so that the remainder of the MSE may be completed by another provider.   , PA-C 06/05/21 0038    08/05/21, MD 06/05/21 [phone]

## 2021-06-05 NOTE — ED Provider Notes (Signed)
[facility] EMERGENCY DEPARTMENT Provider Note   CSN: [id number] Arrival date & time: 06/04/21  2132     History Chief Complaint  Patient presents with   Fall/Knee and back pain     Omar Sandoval is a 65 y.o. male.  Patient to ED after fall while at work earlier this evening. He slipped on something on the floor and went down onto both knees causing knee and hip pain. No other injury. He has been ambulatory since falling. He reports he is here secondary to his employer's request.   The history is provided by the patient. No language interpreter was used.      Past Medical History:  Diagnosis Date   HIV (human immunodeficiency virus infection) (HCC)    Immune deficiency disorder (HCC)    Inguinal hernia    Left   Myocardial infarction [facility])    01-2017 angioplasty with stents    Patient Active Problem List   Diagnosis Date Noted   Essential hypertension 02/08/2019   OSA on CPAP 02/08/2019   Cardiomyopathy, ischemic 02/08/2019   Left inguinal hernia 07/14/2017   Chronic combined systolic and diastolic heart failure (HCC) 02/28/2017   Hyperlipidemia 02/06/2017   Coronary artery disease involving native coronary artery of native heart with angina pectoris (HCC) 02/06/2017   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 02/2017 02/03/2017   HIV disease (HCC) 01/06/2013   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    Past Surgical History:  Procedure Laterality Date   COLONOSCOPY     CORONARY ANGIOPLASTY     CORONARY/GRAFT ACUTE MI REVASCULARIZATION N/A 02/03/2017   Procedure: Coronary/Graft Acute MI Revascularization;  Surgeon: [name], MD;  [facility] INVAS[facility] CV LAB;  Service: Cardiovascular;  Laterality: N/A;   FRACTURE SURGERY     left ankle and right foot   HERNIA REPAIR     INGUINAL HERNIA REPAIR Left 08/27/2017   Procedure: LAPAROSCOPIC LEFT INGUINAL HERNIA REPAIR WITH MESH;  Surgeon: Axel Filler, MD;  Location: [facility] OR;  Service: General;  Laterality: Left;    INSERTION OF MESH Left 08/27/2017   Procedure: INSERTION OF MESH;  Surgeon: Axel Filler, MD;  Location: [facility] OR;  Service: General;  Laterality: Left;   LEFT HEART CATH AND CORONARY ANGIOGRAPHY N/A 02/03/2017   Procedure: Left Heart Cath and Coronary Angiography;  Surgeon: [name], MD;  Location: [facility] INVASIVE CV LAB;  Service: Cardiovascular;  Laterality: N/A;   LEFT HEART CATH AND CORONARY ANGIOGRAPHY N/A 09/25/2018   Procedure: LEFT HEART CATH AND CORONARY ANGIOGRAPHY;  Surgeon: [name], MD;  [facility]: [facility] INVASIVE CV LAB;  Service: Cardiovascular;  Laterality: N/A;   OPEN REDUCTION INTERNAL FIXATION (ORIF) DISTAL RADIAL FRACTURE Right 07/20/2016   Procedure: OPEN REDUCTION INTERNAL FIXATION (ORIF) DISTAL RADIAL FRACTURE;  Surgeon: [name], MD;  Location: [facility] OR;  Service: Orthopedics;  Laterality: Right;   SURGERY SCROTAL / TESTICULAR     nondecended testes       Family History  Problem Relation Age of Onset   Cancer Mother        lung   Cancer Brother        lung, liver, and colon    Social History   Tobacco Use   Smoking status: Former    Packs/day: 0.50    Years: 45.00    Pack years: 22.50    Types: Cigarettes    Quit date: 2013    Years since quitting: 9.7   Smokeless tobacco: Never  Vaping  Use   Vaping Use: Never used  Substance Use Topics   Alcohol use: Yes    Alcohol/week: 3.0 standard drinks    Types: 3 Cans of beer per week    Comment: beer daily   Drug use: Yes    Types: Marijuana    Comment: used  Marijuana    Home Medications Prior to Admission medications   Medication Sig Start Date End Date Taking? Authorizing Provider  atorvastatin (LIPITOR) 40 MG tablet TAKE 1 TABLET BY MOUTH ONCE DAILY AT 6PM 11/16/20   [name], MD  clopidogrel (PLAVIX) 75 MG tablet Take 1 tablet (75 mg total) by mouth daily. 07/26/20   [name], MD  Dolutegravir-lamiVUDine 50-300 MG TABS Take 1 tablet by mouth daily.  06/03/18   [provider]   metoprolol succinate (TOPROL-XL) 25 MG 24 hr tablet Take 1/2 (one-half) tablet by mouth twice daily 04/18/21   [name], MD  nitroGLYCERIN (NITROSTAT) 0.4 MG SL tablet Place 1 tablet (0.4 mg total) under the tongue every 5 (five) minutes x 3 doses as needed for chest pain. 03/19/18   [name], MD  oxyCODONE-acetaminophen (PERCOCET) 10-325 MG tablet Take 0.5-1 tablets by mouth every 4 (four) hours as needed for pain.    [provider]    Allergies    Chocolate, Lac bovis, Lactose intolerance (gi), and Vicodin [hydrocodone-acetaminophen]  Review of Systems   Review of Systems  Constitutional:  Negative for diaphoresis.  Respiratory: Negative.  Negative for shortness of breath.   Cardiovascular: Negative.  Negative for chest pain.  Gastrointestinal: Negative.  Negative for abdominal pain.  Musculoskeletal:        See HPI  Skin: Negative.  Negative for color change and wound.  Neurological: Negative.  Negative for weakness and numbness.   Physical Exam Updated Vital Signs BP (!) 160/105   Pulse 79   Resp 16   Ht 5\' 6"  (1.676 m)   Wt 85 kg   SpO2 100%   BMI 30.25 kg/m   Physical Exam Vitals and nursing note reviewed.  Constitutional:      Appearance: He is well-developed.  Cardiovascular:     Pulses: Normal pulses.  Pulmonary:     Effort: Pulmonary effort is normal.  Musculoskeletal:        General: Normal range of motion.     Cervical back: Normal range of motion.     Comments: Ambulatory. No bony deformities. No midline lumbar tenderness.   Skin:    General: Skin is warm and dry.  Neurological:     Mental Status: He is alert and oriented to person, place, and time.    ED Results / Procedures / Treatments   Labs (all labs ordered are listed, but only abnormal results are displayed) Labs Reviewed - No data to display  EKG None  Radiology No results found.  Procedures Procedures   Medications Ordered in ED Medications - No data to  display  ED Course  I have reviewed the triage vital signs and the nursing notes.  Pertinent labs & imaging results that were available during my care of the patient were reviewed by me and considered in my medical decision making (see chart for details).    MDM Rules/Calculators/A&P                           Patient to ED after mechanical fall at the request of his employer.  Imaging is negative. He is ambulatory without limitation.   Final Clinical Impression(s) / ED Diagnoses Final diagnoses:  Fall    Rx / DC Orders ED Discharge Orders     None        Elpidio Anis, PA-C 06/05/21 0122    Shon Baton, MD 06/05/21 [phone]

## 2021-07-09 NOTE — Progress Notes (Deleted)
Cardiology Office Note:    Date:  07/09/2021   ID:  ACY ORSAK, DOB [birth year], MRN [id number]  PCP:  Bartholomew Boards, MD  Cardiologist:  Lesleigh Noe, MD   Referring MD: Bartholomew Boards, MD   No chief complaint on file.   History of Present Illness:    Omar Sandoval is a 65 y.o. male with a hx of HIV, hypertension, HLD, PVCs, chronic combined systolic and diastolic CHF.  Also has CAD status post inferior STEMI 01/2017 treated with DES to the RCA with residual 70% mid LAD and 60 to 70% distal circumflex EF 40 to 50% at that time.  Echo after cath 02/06/2017 EF 45 to 50%.   ***  Past Medical History:  Diagnosis Date   HIV (human immunodeficiency virus infection) (HCC)    Immune deficiency disorder (HCC)    Inguinal hernia    Left   Myocardial infarction [facility])    04-3234 angioplasty with stents    Past Surgical History:  Procedure Laterality Date   COLONOSCOPY     CORONARY ANGIOPLASTY     CORONARY/GRAFT ACUTE MI REVASCULARIZATION N/A 02/03/2017   Procedure: Coronary/Graft Acute MI Revascularization;  Surgeon: [name], MD;  [facility]: [facility] INVASIVE CV LAB;  Service: Cardiovascular;  Laterality: N/A;   FRACTURE SURGERY     left ankle and right foot   HERNIA REPAIR     INGUINAL HERNIA REPAIR Left 08/27/2017   Procedure: LAPAROSCOPIC LEFT INGUINAL HERNIA REPAIR WITH MESH;  Surgeon: Axel Filler, MD;  Location: [facility] OR;  Service: General;  Laterality: Left;   INSERTION OF MESH Left 08/27/2017   Procedure: INSERTION OF MESH;  Surgeon: Axel Filler, MD;  Location: [facility] OR;  Service: General;  Laterality: Left;   LEFT HEART CATH AND CORONARY ANGIOGRAPHY N/A 02/03/2017   Procedure: Left Heart Cath and Coronary Angiography;  Surgeon: [name], MD;  Location: [facility] INVASIVE CV LAB;  Service: Cardiovascular;  Laterality: N/A;   LEFT HEART CATH AND CORONARY ANGIOGRAPHY N/A 09/25/2018   Procedure: LEFT HEART CATH AND CORONARY ANGIOGRAPHY;  Surgeon: [name], MD;  Location: [facility]  INVASIVE CV LAB;  Service: Cardiovascular;  Laterality: N/A;   OPEN REDUCTION INTERNAL FIXATION (ORIF) DISTAL RADIAL FRACTURE Right 07/20/2016   Procedure: OPEN REDUCTION INTERNAL FIXATION (ORIF) DISTAL RADIAL FRACTURE;  Surgeon: [name], MD;  Location: [facility] OR;  Service: Orthopedics;  Laterality: Right;   SURGERY SCROTAL / TESTICULAR     nondecended testes    Current Medications: No outpatient medications have been marked as taking for the 07/10/21 encounter (Appointment) with [name], MD.     Allergies:   Chocolate, Lac bovis, Lactose intolerance (gi), and Vicodin [hydrocodone-acetaminophen]   Social History   Socioeconomic History   Marital status: Single    Spouse name: Not on file   Number of children: Not on file   Years of education: Not on file   Highest education level: Not on file  Occupational History   Not on file  Tobacco Use   Smoking status: Former    Packs/day: 0.50    Years: 45.00    Pack years: 22.50    Types: Cigarettes    Quit date: 2013    Years since quitting: 9.8   Smokeless tobacco: Never  Vaping Use   Vaping Use: Never used  Substance and Sexual Activity   Alcohol use: Yes    Alcohol/week: 3.0 standard drinks    Types: 3 Cans of beer per week  Comment: beer daily   Drug use: Yes    Types: Marijuana    Comment: used  Marijuana   Sexual activity: Not on file  Other Topics Concern   Not on file  Social History Narrative   Not on file   Social Determinants of Health   Financial Resource Strain: Not on file  Food Insecurity: Not on file  Transportation Needs: Not on file  Physical Activity: Not on file  Stress: Not on file  Social Connections: Not on file     Family History: The patient's family history includes Cancer in his brother and mother.  ROS:   Please see the history of present illness.    *** All other systems reviewed and are negative.  EKGs/Labs/Other Studies Reviewed:    The following studies were reviewed  today: ***  EKG:  EKG ***  Recent Labs: 10/20/2020: ALT 43 11/28/2020: BUN 12; Creatinine, Ser 1.10; Hemoglobin 13.1; Platelets 296; Potassium 3.8; Sodium 136  Recent Lipid Panel    Component Value Date/Time   CHOL 134 10/20/2020 0928   TRIG 43 10/20/2020 0928   HDL 66 10/20/2020 0928   CHOLHDL 2.0 10/20/2020 0928   CHOLHDL 3.3 02/03/2017 1323   VLDL 8 02/03/2017 1323   LDLCALC 58 10/20/2020 0928    Physical Exam:    VS:  There were no vitals taken for this visit.    Wt Readings from Last 3 Encounters:  06/05/21 187 lb 6.3 oz (85 kg)  11/28/20 194 lb 0.1 oz (88 kg)  09/26/20 194 lb 9.6 oz (88.3 kg)     GEN: ***. No acute distress HEENT: Normal NECK: No JVD. LYMPHATICS: No lymphadenopathy CARDIAC: *** murmur. RRR *** gallop, or edema. VASCULAR: *** Normal Pulses. No bruits. RESPIRATORY:  Clear to auscultation without rales, wheezing or rhonchi  ABDOMEN: Soft, non-tender, non-distended, No pulsatile mass, MUSCULOSKELETAL: No deformity  SKIN: Warm and dry NEUROLOGIC:  Alert and oriented x 3 PSYCHIATRIC:  Normal affect   ASSESSMENT:    1. Coronary artery disease involving native coronary artery of native heart with angina pectoris (HCC)   2. Chronic combined systolic and diastolic heart failure (HCC)   3. Hyperlipidemia, unspecified hyperlipidemia type   4. Essential hypertension   5. OSA on CPAP    PLAN:    In order of problems listed above:  ***   Medication Adjustments/Labs and Tests Ordered: Current medicines are reviewed at length with the patient today.  Concerns regarding medicines are outlined above.  No orders of the defined types were placed in this encounter.  No orders of the defined types were placed in this encounter.   There are no Patient Instructions on file for this visit.   Signed, Lesleigh Noe, MD  07/09/2021 1:16 PM    [facility]

## 2021-07-10 ENCOUNTER — Ambulatory Visit: Payer: Medicare Other | Admitting: Interventional Cardiology

## 2021-07-10 DIAGNOSIS — E785 Hyperlipidemia, unspecified: Secondary | ICD-10-CM

## 2021-07-10 DIAGNOSIS — G4733 Obstructive sleep apnea (adult) (pediatric): Secondary | ICD-10-CM

## 2021-07-10 DIAGNOSIS — I25119 Atherosclerotic heart disease of native coronary artery with unspecified angina pectoris: Secondary | ICD-10-CM

## 2021-07-10 DIAGNOSIS — I10 Essential (primary) hypertension: Secondary | ICD-10-CM

## 2021-07-10 DIAGNOSIS — I5042 Chronic combined systolic (congestive) and diastolic (congestive) heart failure: Secondary | ICD-10-CM

## 2021-08-13 ENCOUNTER — Other Ambulatory Visit: Payer: Self-pay | Admitting: Interventional Cardiology

## 2021-11-11 ENCOUNTER — Other Ambulatory Visit: Payer: Self-pay | Admitting: Interventional Cardiology

## 2022-01-16 ENCOUNTER — Other Ambulatory Visit: Payer: Self-pay | Admitting: Interventional Cardiology

## 2022-02-11 ENCOUNTER — Other Ambulatory Visit: Payer: Self-pay | Admitting: Interventional Cardiology

## 2022-02-22 ENCOUNTER — Other Ambulatory Visit: Payer: Self-pay | Admitting: Interventional Cardiology

## 2022-03-06 ENCOUNTER — Other Ambulatory Visit: Payer: Self-pay | Admitting: Interventional Cardiology

## 2022-03-22 ENCOUNTER — Other Ambulatory Visit: Payer: Self-pay | Admitting: Interventional Cardiology

## 2022-04-21 NOTE — Progress Notes (Unsigned)
Office Visit    Patient Name: Omar Sandoval Date of Encounter: 04/22/2022  PCP:  Bartholomew Boards, MD   [facility]  Cardiologist:  Lesleigh Noe, MD  Advanced Practice Provider:  No care team member to display Electrophysiologist:  None  HPI    Omar Sandoval is a 66 y.o. male presents with a history of HIV, hypertension, HLD, PVCs, chronic combined systolic and diastolic CHF, CAD status post inferior STEMI 01/2017 treated with DES to RCA with residual disease (echocardiogram after cath showed EF 45 to 50%).  Today for overdue follow-up appointment.  The patient was last seen by Dr. Katrinka Blazing 09/2020 and at that time he was having episodes where he felt breathless.  It occurred randomly.  He denied orthopnea.  He does not smoke cigarettes but does smoke "blunts".  He denied any peripheral edema and chest pain.  Today, he states that he is having some problems right-sided chest tightness and shortness of breath that has been going on for about a month.  He tells me today that he quit smoking cigarettes but does smoke marijuana.  He has been smoking marijuana for over 50 years.  He tells me that he recently started smoking a hookah pipe that has tobacco in it.  He thinks this might be related to the onset of his chest pain.  His chest pain is not similar to the pain that he felt back in 2018 when he got his stent placed.  He has an old RBBB which is found on EKG today.  Otherwise, he is doing okay.  No edema, orthopnea, PND. Reports no palpitations.    Past Medical History    Past Medical History:  Diagnosis Date   HIV (human immunodeficiency virus infection) (HCC)    Immune deficiency disorder (HCC)    Inguinal hernia    Left   Myocardial infarction [facility])    01-2017 angioplasty with stents   Past Surgical History:  Procedure Laterality Date   COLONOSCOPY     CORONARY ANGIOPLASTY     CORONARY/GRAFT ACUTE MI REVASCULARIZATION N/A 02/03/2017   Procedure:  Coronary/Graft Acute MI Revascularization;  Surgeon: [name], MD;  Location: [facility] INVASIVE CV LAB;  Service: Cardiovascular;  Laterality: N/A;   FRACTURE SURGERY     left ankle and right foot   HERNIA REPAIR     INGUINAL HERNIA REPAIR Left 08/27/2017   Procedure: LAPAROSCOPIC LEFT INGUINAL HERNIA REPAIR WITH MESH;  Surgeon: Axel Filler, MD;  Location: [facility] OR;  Service: General;  Laterality: Left;   INSERTION OF MESH Left 08/27/2017   Procedure: INSERTION OF MESH;  Surgeon: Axel Filler, MD;  Location: [facility] OR;  Service: General;  Laterality: Left;   LEFT HEART CATH AND CORONARY ANGIOGRAPHY N/A 02/03/2017   Procedure: Left Heart Cath and Coronary Angiography;  Surgeon: [name], MD;  Location: [facility] INVASIVE CV LAB;  Service: Cardiovascular;  Laterality: N/A;   LEFT HEART CATH AND CORONARY ANGIOGRAPHY N/A 09/25/2018   Procedure: LEFT HEART CATH AND CORONARY ANGIOGRAPHY;  Surgeon: [name], MD;  Location: [facility] INVASIVE CV LAB;  Service: Cardiovascular;  Laterality: N/A;   OPEN REDUCTION INTERNAL FIXATION (ORIF) DISTAL RADIAL FRACTURE Right 07/20/2016   Procedure: OPEN REDUCTION INTERNAL FIXATION (ORIF) DISTAL RADIAL FRACTURE;  Surgeon: [name], MD;  Location: [facility] OR;  Service: Orthopedics;  Laterality: Right;   SURGERY SCROTAL / TESTICULAR     nondecended testes    Allergies  Allergies  Allergen  Reactions   Chocolate Diarrhea   Lactose Intolerance (Gi) Diarrhea and Nausea And Vomiting   Milk (Cow) Diarrhea   Vicodin [Hydrocodone-Acetaminophen] Nausea Only and Other (See Comments)    GI Upset    EKGs/Labs/Other Studies Reviewed:   The following studies were reviewed today:  Echocardiogram 10/20/2020 IMPRESSIONS     1. Left ventricular ejection fraction by 3D volume is 47 %. The left  ventricle has mildly decreased function. The left ventricle demonstrates  regional wall motion abnormalities (see scoring diagram/findings for  description). There is mild left   ventricular hypertrophy. Left ventricular diastolic parameters are  consistent with Grade I diastolic dysfunction (impaired relaxation).   2. Right ventricular systolic function is normal. The right ventricular  size is normal. Tricuspid regurgitation signal is inadequate for assessing  PA pressure.   3. The mitral valve is normal in structure. Trivial mitral valve  regurgitation. No evidence of mitral stenosis.   4. The aortic valve is tricuspid. There is moderate calcification of the  aortic valve. There is mild thickening of the aortic valve. Aortic valve  regurgitation is not visualized. No aortic stenosis is present.   5. Aortic dilatation noted. There is mild dilatation of the ascending  aorta, measuring 40 mm.   6. The inferior vena cava is normal in size with greater than 50%  respiratory variability, suggesting right atrial pressure of 3 mmHg.   FINDINGS   Left Ventricle: Left ventricular ejection fraction by 3D volume is 47 %.  The left ventricle has mildly decreased function. The left ventricle  demonstrates regional wall motion abnormalities. The left ventricular  internal cavity size was normal in size.  There is mild left ventricular hypertrophy. Left ventricular diastolic  parameters are consistent with Grade I diastolic dysfunction (impaired  relaxation).   EKG:  EKG is  ordered today.  The ekg ordered today demonstrates normal sinus rhythm, 76 bpm, old RBBB  Recent Labs: No results found for requested labs within last 365 days.  Recent Lipid Panel    Component Value Date/Time   CHOL 134 10/20/2020 0928   TRIG 43 10/20/2020 0928   HDL 66 10/20/2020 0928   CHOLHDL 2.0 10/20/2020 0928   CHOLHDL 3.3 02/03/2017 1323   VLDL 8 02/03/2017 1323   LDLCALC 58 10/20/2020 0928   Home Medications   Current Meds  Medication Sig   atorvastatin (LIPITOR) 40 MG tablet TAKE 1 TABLET BY MOUTH ONCE DAILY AT 6PM .   Pt. Needs to schedule an appt. with Dr. Tamala Julian in order to  receive future refills. Thank You. 3rd and Final Attempt.   clopidogrel (PLAVIX) 75 MG tablet Take 1 tablet (75 mg total) by mouth daily. Pt. Needs to schedule an appt. with Dr. Tamala Julian in order to receive future refills. Thank You. 3rd and Final Attempt.   Dolutegravir-lamiVUDine 50-300 MG TABS Take 1 tablet by mouth daily.    metoprolol succinate (TOPROL-XL) 25 MG 24 hr tablet Take 1 tablet (25 mg total) by mouth daily. Pt. needs to schedule an appt. with Dr. Tamala Julian in order to receive future refills. Thank you 3rd and Final Attempt.   nitroGLYCERIN (NITROSTAT) 0.4 MG SL tablet Place 1 tablet (0.4 mg total) under the tongue every 5 (five) minutes x 3 doses as needed for chest pain.   oxyCODONE-acetaminophen (PERCOCET) 10-325 MG tablet Take 0.5-1 tablets by mouth every 4 (four) hours as needed for pain.     Review of Systems      All other systems reviewed  and are otherwise negative except as noted above.  Physical Exam    VS:  BP 124/70 (BP Location: Left Arm, Patient Position: Sitting, Cuff Size: Normal)   Pulse 76   Ht 5\' 6"  (1.676 m)   Wt 166 lb (75.3 kg)   BMI 26.79 kg/m  , BMI Body mass index is 26.79 kg/m.  Wt Readings from Last 3 Encounters:  04/22/22 166 lb (75.3 kg)  06/05/21 187 lb 6.3 oz (85 kg)  11/28/20 194 lb 0.1 oz (88 kg)     GEN: Well nourished, well developed, in no acute distress. HEENT: normal. Neck: Supple, no JVD, carotid bruits, or masses. Cardiac: RRR, no murmurs, rubs, or gallops. No clubbing, cyanosis, edema.  Radials/PT 2+ and equal bilaterally.  Respiratory:  Respirations regular and unlabored, clear to auscultation bilaterally. GI: Soft, nontender, nondistended. MS: No deformity or atrophy. Skin: Warm and dry, no rash. Neuro:  Strength and sensation are intact. Psych: Normal affect.  Assessment & Plan    Coronary artery disease -Atypical chest pain that does not feel like the pain he felt back in 2018 when he had a stent placed -He is having some  shortness of breath -We will order an echocardiogram for further evaluation and if abnormal we will obtain an ischemic work-up -Continue GDMT: Lipitor 40 mg daily, Plavix 75 mg daily, metoprolol succinate 25 mg daily, as needed nitroglycerin -He tells me that he ran out of his Plavix 2 days ago we have refilled this today.  He has not needed to use his nitroglycerin.  Chronic diastolic and systolic heart failure -SOB and chest tightness but not like in the past.  -We ordered an echocardiogram for further evaluation  Hyperlipidemia -fasting lipid panel ordered today  Hypertension -BP well controlled today -continue current medication regimen  OSA on CPAP -stable  PVCs -asymptomatic  Hx of HIV          Disposition: Follow up 2-3 months with 2019, MD or APP.  Signed, Lesleigh Noe, PA-C 04/22/2022, 12:47 PM [facility]

## 2022-04-22 ENCOUNTER — Ambulatory Visit: Payer: Medicare Other | Attending: Physician Assistant | Admitting: Physician Assistant

## 2022-04-22 ENCOUNTER — Encounter: Payer: Self-pay | Admitting: Physician Assistant

## 2022-04-22 VITALS — BP 124/70 | HR 76 | Ht 66.0 in | Wt 166.0 lb

## 2022-04-22 DIAGNOSIS — E785 Hyperlipidemia, unspecified: Secondary | ICD-10-CM

## 2022-04-22 DIAGNOSIS — I25119 Atherosclerotic heart disease of native coronary artery with unspecified angina pectoris: Secondary | ICD-10-CM | POA: Diagnosis not present

## 2022-04-22 DIAGNOSIS — I5042 Chronic combined systolic (congestive) and diastolic (congestive) heart failure: Secondary | ICD-10-CM

## 2022-04-22 DIAGNOSIS — I10 Essential (primary) hypertension: Secondary | ICD-10-CM | POA: Diagnosis not present

## 2022-04-22 DIAGNOSIS — G4733 Obstructive sleep apnea (adult) (pediatric): Secondary | ICD-10-CM

## 2022-04-22 DIAGNOSIS — R0602 Shortness of breath: Secondary | ICD-10-CM

## 2022-04-22 DIAGNOSIS — B20 Human immunodeficiency virus [HIV] disease: Secondary | ICD-10-CM

## 2022-04-22 DIAGNOSIS — I493 Ventricular premature depolarization: Secondary | ICD-10-CM

## 2022-04-22 DIAGNOSIS — Z9989 Dependence on other enabling machines and devices: Secondary | ICD-10-CM

## 2022-04-22 LAB — LIPID PANEL
Chol/HDL Ratio: 1.9 ratio (ref 0.0–5.0)
Cholesterol, Total: 135 mg/dL (ref 100–199)
HDL: 71 mg/dL (ref >39)
LDL Chol Calc (NIH): 54 mg/dL (ref 0–99)
Triglycerides: 42 mg/dL (ref 0–149)
VLDL Cholesterol Cal: 10 mg/dL (ref 5–40)

## 2022-04-22 NOTE — Patient Instructions (Signed)
Medication Instructions:  Your physician recommends that you continue on your current medications as directed. Please refer to the Current Medication list given to you today.  *If you need a refill on your cardiac medications before your next appointment, please call your pharmacy*   Lab Work: Fasting lipid panel when fasting If you have labs (blood work) drawn today and your tests are completely normal, you will receive your results only by: MyChart Message (if you have MyChart) OR A paper copy in the mail If you have any lab test that is abnormal or we need to change your treatment, we will call you to review the results.   Testing/Procedures: Your physician has requested that you have an echocardiogram. Echocardiography is a painless test that uses sound waves to create images of your heart. It provides your doctor with information about the size and shape of your heart and how well your heart's chambers and valves are working. This procedure takes approximately one hour. There are no restrictions for this procedure.    Follow-Up: At [facility], you and your health needs are our priority.  As part of our continuing mission to provide you with exceptional heart care, we have created designated Provider Care Teams.  These Care Teams include your primary Cardiologist (physician) and Advanced Practice Providers (APPs -  Physician Assistants and Nurse Practitioners) who all work together to provide you with the care you need, when you need it.  We recommend signing up for the patient portal called "MyChart".  Sign up information is provided on this After Visit Summary.  MyChart is used to connect with patients for Virtual Visits (Telemedicine).  Patients are able to view lab/test results, encounter notes, upcoming appointments, etc.  Non-urgent messages can be sent to your provider as well.   To learn more about what you can do with MyChart, go to [email or web address].    Your next  appointment:   1-2 month(s)  The format for your next appointment:   In Person  Provider:   Lesleigh Noe, MD    Important Information About Sugar

## 2022-04-24 ENCOUNTER — Telehealth: Payer: Self-pay | Admitting: *Deleted

## 2022-04-24 NOTE — Telephone Encounter (Signed)
KBT:CYELY with patient regarding his lab results. He asked me when his appointment to see Dr Katrinka Blazing was and I provided him with the date and time, which was scheduled with him before he left the office at his visit on 04/22/22. He immediately began to express his frustration with the office and how we operate. He says that it is our job to make sure that he has an appointment not his and he previously was supposed to receive a call or a letter with an appointment with Dr Katrinka Blazing but he never received anything. He says that Dr Katrinka Blazing is supposed to be his cardiologist but he never gets to see him, he claims that he has not seen him in 5 years. Upon review of his appointments I informed him that he saw Dr Katrinka Blazing last year but he then became more frustrated and said that has been over a year which is to long. He states that he is going to find out when Dr Katrinka Blazing will be in the office and he plans to just show up. He then proceeded to hang up the phone.

## 2022-05-02 ENCOUNTER — Ambulatory Visit (HOSPITAL_COMMUNITY): Payer: Medicare Other | Attending: Physician Assistant

## 2022-05-02 DIAGNOSIS — R0602 Shortness of breath: Secondary | ICD-10-CM | POA: Diagnosis present

## 2022-05-02 LAB — ECHOCARDIOGRAM COMPLETE
Area-P 1/2: 2.8 cm2
S' Lateral: 4.8 cm

## 2022-05-03 ENCOUNTER — Telehealth: Payer: Self-pay

## 2022-05-03 MED ORDER — ENTRESTO 24-26 MG PO TABS: 1.0000 | ORAL_TABLET | Freq: Two times a day (BID) | ORAL | 11 refills | Status: DC

## 2022-05-03 NOTE — Telephone Encounter (Signed)
Can we start him on Entresto 24/26 BID and try to see if insurance will cover Jardiance? Can we arrange a BMP 7 days after with a follow-up BP check on either my schedule or another APP?   Thanks!  Sharlene Dory, PA-C

## 2022-05-03 NOTE — Telephone Encounter (Signed)
Placed order for Entresto. Patient will see Robin Searing, NP to discuss echo and Jardiance, and check BP and BMET after being on Entresto for one week.

## 2022-05-06 NOTE — Progress Notes (Signed)
Office Visit    Patient Name: Omar Sandoval Date of Encounter: 05/09/2022  Primary Care Provider:  Bartholomew Boards, MD Primary Cardiologist:  Omar Noe, MD Primary Electrophysiologist: None  Chief Complaint    Omar Sandoval is a 66 y.o. male with PMH of chronic combined systolic and diastolic CHF, HLD, HTN, HIV, PVCs, CAD s/p inferior STEMI treated with DES to RCA who presents today for follow-up of CHF and HTN.  Past Medical History    Past Medical History:  Diagnosis Date   HIV (human immunodeficiency virus infection) (HCC)    Immune deficiency disorder (HCC)    Inguinal hernia    Left   Myocardial infarction [facility])    01-2017 angioplasty with stents   Past Surgical History:  Procedure Laterality Date   COLONOSCOPY     CORONARY ANGIOPLASTY     CORONARY/GRAFT ACUTE MI REVASCULARIZATION N/A 02/03/2017   Procedure: Coronary/Graft Acute MI Revascularization;  Surgeon: Omar Records, MD;  Location: [facility] INVASIVE CV LAB;  Service: Cardiovascular;  Laterality: N/A;   FRACTURE SURGERY     left ankle and right foot   HERNIA REPAIR     INGUINAL HERNIA REPAIR Left 08/27/2017   Procedure: LAPAROSCOPIC LEFT INGUINAL HERNIA REPAIR WITH MESH;  Surgeon: Omar Filler, MD;  Location: [facility] OR;  Service: General;  Laterality: Left;   INSERTION OF MESH Left 08/27/2017   Procedure: INSERTION OF MESH;  Surgeon: Omar Filler, MD;  Location: [facility] OR;  Service: General;  Laterality: Left;   LEFT HEART CATH AND CORONARY ANGIOGRAPHY N/A 02/03/2017   Procedure: Left Heart Cath and Coronary Angiography;  Surgeon: Omar Records, MD;  Location: [facility] INVASIVE CV LAB;  Service: Cardiovascular;  Laterality: N/A;   LEFT HEART CATH AND CORONARY ANGIOGRAPHY N/A 09/25/2018   Procedure: LEFT HEART CATH AND CORONARY ANGIOGRAPHY;  Surgeon: Omar Records, MD;  Location: [facility] INVASIVE CV LAB;  Service: Cardiovascular;  Laterality: N/A;   OPEN REDUCTION INTERNAL FIXATION (ORIF) DISTAL RADIAL FRACTURE Right  07/20/2016   Procedure: OPEN REDUCTION INTERNAL FIXATION (ORIF) DISTAL RADIAL FRACTURE;  Surgeon: Omar Bienenstock, MD;  Location: [facility] OR;  Service: Orthopedics;  Laterality: Right;   SURGERY SCROTAL / TESTICULAR     nondecended testes    Allergies  Allergies  Allergen Reactions   Chocolate Diarrhea   Lactose Intolerance (Gi) Diarrhea and Nausea And Vomiting   Milk (Cow) Diarrhea   Vicodin [Hydrocodone-Acetaminophen] Nausea Only and Other (See Comments)    GI Upset    History of Present Illness    Omar Sandoval has a PMH of is a 66 year old male with the above mention past medical history who presents today for follow-up of CHF and HTN.  Omar Sandoval had an inferior STEMI in 2018 that was treated with DES to RCA, EF was noted to be 40-50% with mild LVH, mild MR.  He is primarily been managed by Omar Sandoval and has done quite well until 2020.  He underwent stress test in 2020 due to hypotension and complaint of chest pain.  Stress test revealed EF of 20% with global hypokinesis and fixed defect involving inferior wall consistent with previous inferior MI.  Omar Sandoval reviewed and recommended cardiac catheterization.  LHC performed and revealed calcified left main lesion that was unchanged from previous cath in 01/2017, 30% in-stent restenosis of mid RCA, 70% distal left circumflex lesion, 80% distal OM lesion with overall stable coronary anatomy and medical therapy recommended.  He was started  on Plavix and ASA 81 mg.  Patient underwent sleep study and was positive for OSA but refuses to use CPAP.  He was most recently seen by Omar Sandoval, Omar Sandoval on 03/2022.  During visit patient endorsed some right-sided chest pain with shortness of breath and had been occurring for over 1 month.  His pain was not similar to his previous chest pains during 2018 and 2020.  2D echo was ordered that revealed EF of 35-40%, RWMA with moderately decreased LV function, grade 1 DD, hypokinesis of basal mid inferior septal wall and inferior  lateral wall and aortic dilation measuring 39 mm in ascending aorta.  [name] presents today for follow-up alone.  Since last being seen in the office patient reports that he has been experiencing some shortness of breath with exertion.  He is currently euvolemic on examination but does have trace amount of edema in lower extremities.  He is continuing to smoke hookah and does smoke marijuana.  Patient denies chest pain, palpitations, dyspnea, PND, orthopnea, nausea, vomiting, dizziness, syncope, edema, weight gain, or early satiety.   Home Medications    Current Outpatient Medications  Medication Sig Dispense Refill   atorvastatin (LIPITOR) 40 MG tablet TAKE 1 TABLET BY MOUTH ONCE DAILY AT 6PM .   Pt. Needs to schedule an appt. with Dr. Tamala Sandoval in order to receive future refills. Thank You. 3rd and Final Attempt. 15 tablet 0   clopidogrel (PLAVIX) 75 MG tablet Take 1 tablet (75 mg total) by mouth daily. Pt. Needs to schedule an appt. with Dr. Tamala Sandoval in order to receive future refills. Thank You. 3rd and Final Attempt. 15 tablet 0   Dolutegravir-lamiVUDine 50-300 MG TABS Take 1 tablet by mouth daily.      metoprolol succinate (TOPROL-XL) 25 MG 24 hr tablet Take 1 tablet (25 mg total) by mouth daily. Pt. needs to schedule an appt. with Dr. Tamala Sandoval in order to receive future refills. Thank you 3rd and Final Attempt. 15 tablet 0   nitroGLYCERIN (NITROSTAT) 0.4 MG SL tablet Place 1 tablet (0.4 mg total) under the tongue every 5 (five) minutes x 3 doses as needed for chest pain. 25 tablet 6   oxyCODONE-acetaminophen (PERCOCET) 10-325 MG tablet Take 0.5-1 tablets by mouth every 4 (four) hours as needed for pain.     sacubitril-valsartan (ENTRESTO) 24-26 MG Take 1 tablet by mouth 2 (two) times daily. 60 tablet 11   No current facility-administered medications for this visit.     Review of Systems  Please see the history of present illness.    (+) Shortness of breath with exertion (+) Lower extremity  edema  All other systems reviewed and are otherwise negative except as noted above.  Physical Exam    Wt Readings from Last 3 Encounters:  05/09/22 167 lb (75.8 kg)  04/22/22 166 lb (75.3 kg)  06/05/21 187 lb 6.3 oz (85 kg)   VS: Vitals:   05/09/22 0936  BP: 122/72  Pulse: 65  SpO2: 97%  ,Body mass index is 26.16 kg/m.  Constitutional:      Appearance: Healthy appearance. Not in distress.  Neck:     Vascular: JVD normal.  Pulmonary:     Effort: Pulmonary effort is normal.     Breath sounds: No wheezing. No rales. Diminished in the bases Cardiovascular:     Normal rate. Regular rhythm. Normal S1. Normal S2.      Murmurs: There is no murmur.  Edema:    Peripheral edema absent.  Abdominal:  Palpations: Abdomen is soft non tender. There is no hepatomegaly.  Skin:    General: Skin is warm and dry.  Neurological:     General: No focal deficit present.     Mental Status: Alert and oriented to person, place and time.     Cranial Nerves: Cranial nerves are intact.  EKG/LABS/Other Studies Reviewed    ECG personally reviewed by me today -none completed today      Lab Results  Component Value Date   CREATININE 1.10 11/28/2020   BUN 12 11/28/2020   NA 136 11/28/2020   K 3.8 11/28/2020   CL 102 11/28/2020   CO2 27 11/28/2020   Lab Results  Component Value Date   ALT 43 10/20/2020   AST 46 (H) 10/20/2020   ALKPHOS 83 10/20/2020   BILITOT 0.3 10/20/2020   Lab Results  Component Value Date   CHOL 135 04/22/2022   HDL 71 04/22/2022   LDLCALC 54 04/22/2022   TRIG 42 04/22/2022   CHOLHDL 1.9 04/22/2022    Lab Results  Component Value Date   HGBA1C 5.5 10/20/2020    Assessment & Plan    1.  Coronary artery disease: - s/p inferior STEMI treated with DES to RCA in 2018 -Current GDMT with Plavix 75 mg and Toprol 25 mg, and Lipitor 40 mg -2D echo completed recently showing wall motion abnormalities -Patient also endorses increased shortness of breath with  activity. -Lexiscan Myoview to be completed to rule out new ischemia contributing to wall motion abnormalities on 2D echo  2.  Combined diastolic and systolic CHF: -Patient last 2D echo revealed EF of 35-40%, RWMA with moderately decreased LV function, grade 1 DD, hypokinesis of basal mid inferior septal wall and inferior lateral wall and aortic dilation measuring 39 mm in ascending aorta. -Patient states that he just started taking Entresto yesterday -Patient has trace lower extremity edema and shortness of breath with exertion -We will have him take Lasix 40 mg for 2 days and then 20 mg daily -Low sodium diet, fluid restriction <2L, and daily weights encouraged. Educated to contact our office for weight gain of 2 lbs overnight or 5 lbs in one week.  -BMET in 1 week  3.  HLD: -Continue Lipitor 40 mg -Patient's last LDL cholesterol was well controlled at 54 below goal of less than 70  4.  HTN: -Blood pressure today was well controlled today at 122/72 -Patient advised to continue low-sodium heart healthy diet  5.  Tobacco abuse: -Patient is currently smoking hookah and was advised to reduce smoking due to current shortness of breath.  6.  Ascending aortic dilation: -Blood pressure well controlled and patient continuing GDMT as noted above    Disposition: Follow-up with Omar Noe, MD or APP in 3 weeks Shared Decision Making/Informed Consent The risks [chest pain, shortness of breath, cardiac arrhythmias, dizziness, blood pressure fluctuations, myocardial infarction, stroke/transient ischemic attack, nausea, vomiting, allergic reaction, radiation exposure, metallic taste sensation and life-threatening complications (estimated to be 1 in 10,000)], benefits (risk stratification, diagnosing coronary artery disease, treatment guidance) and alternatives of a nuclear stress test were discussed in detail with Mr. Lingafelter and he agrees to proceed.   Medication Adjustments/Labs and Tests  Ordered: Current medicines are reviewed at length with the patient today.  Concerns regarding medicines are outlined above.   Signed, Omar Sandoval, Leodis Rains, NP 05/09/2022, 10:25 AM [facility]

## 2022-05-06 NOTE — Telephone Encounter (Signed)
Pt called to ask if Sherryll Burger is safe to take with Plavix?  Per Jari Favre, PA-C pt called back and told ok to take [facility] with plavix as ordered.  Pt also confirmed [facility]Care appointment on 9/14 at [address].   No follow up required.

## 2022-05-06 NOTE — Telephone Encounter (Signed)
Pt c/o medication issue:  1. Name of Medication:  sacubitril-valsartan (ENTRESTO) 24-26 MG  2. How are you currently taking this medication (dosage and times per day)?   3. Are you having a reaction (difficulty breathing--STAT)?   4. What is your medication issue?   Patient following up. He states he is going to pick up his medication and would like to confirm whether or not he needs to continue taking his old medications with it. Please advise.

## 2022-05-09 ENCOUNTER — Ambulatory Visit: Payer: Medicare Other | Attending: Nurse Practitioner | Admitting: Nurse Practitioner

## 2022-05-09 ENCOUNTER — Encounter: Payer: Self-pay | Admitting: Nurse Practitioner

## 2022-05-09 VITALS — BP 122/72 | HR 65 | Ht 67.0 in | Wt 167.0 lb

## 2022-05-09 DIAGNOSIS — Z720 Tobacco use: Secondary | ICD-10-CM

## 2022-05-09 DIAGNOSIS — E785 Hyperlipidemia, unspecified: Secondary | ICD-10-CM

## 2022-05-09 DIAGNOSIS — I25119 Atherosclerotic heart disease of native coronary artery with unspecified angina pectoris: Secondary | ICD-10-CM | POA: Diagnosis not present

## 2022-05-09 DIAGNOSIS — I5042 Chronic combined systolic (congestive) and diastolic (congestive) heart failure: Secondary | ICD-10-CM | POA: Diagnosis not present

## 2022-05-09 DIAGNOSIS — I10 Essential (primary) hypertension: Secondary | ICD-10-CM

## 2022-05-09 MED ORDER — FUROSEMIDE 20 MG PO TABS: 20.0000 mg | ORAL_TABLET | Freq: Every day | ORAL | 3 refills | Status: DC

## 2022-05-09 NOTE — Patient Instructions (Signed)
Medication Instructions:  Your physician has recommended you make the following change in your medication:  Start taking lasix/furosemide 20mg  daily. Take 2 tablets a day for 2 days then take one tablet daily  *If you need a refill on your cardiac medications before your next appointment, please call your pharmacy*   Lab Work: Bmet in 1 week  If you have labs (blood work) drawn today and your tests are completely normal, you will receive your results only by: MyChart Message (if you have MyChart) OR A paper copy in the mail If you have any lab test that is abnormal or we need to change your treatment, we will call you to review the results.   Testing/Procedures: Your physician has requested that you have a lexiscan myoview. For further information please visit . Please follow instruction sheet, as given.    Follow-Up: At [facility], you and your health needs are our priority.  As part of our continuing mission to provide you with exceptional heart care, we have created designated Provider Care Teams.  These Care Teams include your primary Cardiologist (physician) and Advanced Practice Providers (APPs -  Physician Assistants and Nurse Practitioners) who all work together to provide you with the care you need, when you need it.  We recommend signing up for the patient portal called "MyChart".  Sign up information is provided on this After Visit Summary.  MyChart is used to connect with patients for Virtual Visits (Telemedicine).  Patients are able to view lab/test results, encounter notes, upcoming appointments, etc.  Non-urgent messages can be sent to your provider as well.   To learn more about what you can do with MyChart, go to [facility].    Your next appointment:   3 week(s)  The format for your next appointment:   In Person  Provider:   [email or web address], NP

## 2022-05-10 ENCOUNTER — Telehealth (HOSPITAL_COMMUNITY): Payer: Self-pay | Admitting: *Deleted

## 2022-05-10 NOTE — Telephone Encounter (Signed)
Patient given detailed instructions per Myocardial Perfusion Study Information Sheet for the test on 05/17/22 Patient notified to arrive 15 minutes early and that it is imperative to arrive on time for appointment to keep from having the test rescheduled.  If you need to cancel or reschedule your appointment, please call the office within 24 hours of your appointment. . Patient verbalized understanding. Jaydynn Wolford Jacqueline   

## 2022-05-12 ENCOUNTER — Other Ambulatory Visit: Payer: Self-pay | Admitting: Interventional Cardiology

## 2022-05-16 ENCOUNTER — Other Ambulatory Visit: Payer: Medicare Other

## 2022-05-17 ENCOUNTER — Ambulatory Visit: Payer: Medicare Other

## 2022-05-17 ENCOUNTER — Ambulatory Visit (HOSPITAL_COMMUNITY): Payer: Medicare Other | Attending: Nurse Practitioner

## 2022-05-17 DIAGNOSIS — I25119 Atherosclerotic heart disease of native coronary artery with unspecified angina pectoris: Secondary | ICD-10-CM | POA: Diagnosis not present

## 2022-05-17 DIAGNOSIS — I5042 Chronic combined systolic (congestive) and diastolic (congestive) heart failure: Secondary | ICD-10-CM | POA: Diagnosis not present

## 2022-05-17 DIAGNOSIS — R079 Chest pain, unspecified: Secondary | ICD-10-CM | POA: Insufficient documentation

## 2022-05-17 DIAGNOSIS — Z79899 Other long term (current) drug therapy: Secondary | ICD-10-CM | POA: Insufficient documentation

## 2022-05-17 LAB — BASIC METABOLIC PANEL
BUN/Creatinine Ratio: 21 (ref 10–24)
BUN: 14 mg/dL (ref 8–27)
CO2: 28 mmol/L (ref 20–29)
Calcium: 9.4 mg/dL (ref 8.6–10.2)
Chloride: 100 mmol/L (ref 96–106)
Creatinine, Ser: 0.66 mg/dL — ABNORMAL LOW (ref 0.76–1.27)
Glucose: 83 mg/dL (ref 70–99)
Potassium: 4.2 mmol/L (ref 3.5–5.2)
Sodium: 137 mmol/L (ref 134–144)
eGFR: 104 mL/min/{1.73_m2} (ref >59)

## 2022-05-17 LAB — MYOCARDIAL PERFUSION IMAGING
LV dias vol: 145 mL (ref 62–150)
LV sys vol: 78 mL
Nuc Stress EF: 46 %
Peak HR: 106 {beats}/min
Rest HR: 62 {beats}/min
Rest Nuclear Isotope Dose: 10.1 mCi
SDS: 3
SRS: 15
SSS: 18
ST Depression (mm): 0 mm
Stress Nuclear Isotope Dose: 32.5 mCi
TID: 1.04

## 2022-05-17 MED ORDER — REGADENOSON 0.4 MG/5ML IV SOLN
0.4000 mg | Freq: Once | INTRAVENOUS | Status: AC
  Administered 2022-05-17: 0.4 mg via INTRAVENOUS

## 2022-05-17 MED ORDER — TECHNETIUM TC 99M TETROFOSMIN IV KIT
10.1000 | PACK | Freq: Once | INTRAVENOUS | Status: AC | PRN
  Administered 2022-05-17: 10.1 via INTRAVENOUS

## 2022-05-17 MED ORDER — TECHNETIUM TC 99M TETROFOSMIN IV KIT
32.6000 | PACK | Freq: Once | INTRAVENOUS | Status: AC | PRN
  Administered 2022-05-17: 32.6 via INTRAVENOUS

## 2022-05-28 ENCOUNTER — Other Ambulatory Visit: Payer: Self-pay | Admitting: Interventional Cardiology

## 2022-05-30 NOTE — Progress Notes (Signed)
Office Visit    Patient Name: Omar Sandoval Date of Encounter: 05/31/2022  Primary Care Provider:  Eyvonne Mechanic, MD Primary Cardiologist:  Sinclair Grooms, MD Primary Electrophysiologist: None  Chief Complaint    Omar Sandoval is a 66 y.o. male with PMH of chronic combined systolic and diastolic CHF, HLD, HTN, HIV, PVCs, CAD s/p inferior STEMI treated with DES to RCA who presents today for 4-week follow-up of congestive heart failure.  Past Medical History    Past Medical History:  Diagnosis Date   HIV (human immunodeficiency virus infection) ([place])    Immune deficiency disorder ([place])    Inguinal hernia    Left   Myocardial infarction [facility])    01-2017 angioplasty with stents   Past Surgical History:  Procedure Laterality Date   COLONOSCOPY     CORONARY ANGIOPLASTY     CORONARY/GRAFT ACUTE MI REVASCULARIZATION N/A 02/03/2017   Procedure: Coronary/Graft Acute MI Revascularization;  Surgeon: Belva Crome, MD;  Location: [place] [facility];  Service: Cardiovascular;  Laterality: N/A;   FRACTURE SURGERY     left ankle and right foot   HERNIA REPAIR     INGUINAL HERNIA REPAIR Left 08/27/2017   Procedure: LAPAROSCOPIC LEFT INGUINAL HERNIA REPAIR WITH MESH;  Surgeon: Ralene Ok, MD;  Location: [place];  Service: General;  Laterality: Left;   INSERTION OF MESH Left 08/27/2017   Procedure: INSERTION OF MESH;  Surgeon: Ralene Ok, MD;  Location: [place];  Service: General;  Laterality: Left;   LEFT HEART CATH AND CORONARY ANGIOGRAPHY N/A 02/03/2017   Procedure: Left Heart Cath and Coronary Angiography;  Surgeon: Belva Crome, MD;  Location: [place] [facility];  Service: Cardiovascular;  Laterality: N/A;   LEFT HEART CATH AND CORONARY ANGIOGRAPHY N/A 09/25/2018   Procedure: LEFT HEART CATH AND CORONARY ANGIOGRAPHY;  Surgeon: Belva Crome, MD;  Location: [place] [facility];  Service: Cardiovascular;  Laterality: N/A;   OPEN REDUCTION INTERNAL FIXATION (ORIF) DISTAL RADIAL  FRACTURE Right 07/20/2016   Procedure: OPEN REDUCTION INTERNAL FIXATION (ORIF) DISTAL RADIAL FRACTURE;  Surgeon: Iran Planas, MD;  Location: [place];  Service: Orthopedics;  Laterality: Right;   SURGERY SCROTAL / TESTICULAR     nondecended testes    Allergies  Allergies  Allergen Reactions   Chocolate Diarrhea   Lactose Intolerance (Gi) Diarrhea and Nausea And Vomiting   Milk (Cow) Diarrhea   Vicodin [Hydrocodone-Acetaminophen] Nausea Only and Other (See Comments)    GI Upset    History of Present Illness   Omar Sandoval has a PMH of is a 66 year old male with the above mention past medical history who presents today for follow-up of CHF and HTN.  Omar Sandoval had an inferior STEMI in 2018 that was treated with DES to RCA, EF was noted to be 40-50% with mild LVH, mild MR.  He is primarily been managed by Dr. Tamala Julian and has done quite well until 2020.  He underwent stress test in 2020 due to hypotension and complaint of chest pain.  Stress test revealed EF of 20% with global hypokinesis and fixed defect involving inferior wall consistent with previous inferior MI.  Dr. Tamala Julian reviewed and recommended cardiac catheterization.  LHC performed and revealed calcified left main lesion that was unchanged from previous cath in 01/2017, 30% in-stent restenosis of mid RCA, 70% distal left circumflex lesion, 80% distal OM lesion with overall stable coronary anatomy and medical therapy recommended.  He was started  on Plavix and ASA 81 mg.  Patient underwent sleep study and was positive for OSA but refuses to use CPAP.  He was most recently seen by Nicholes Rough, [place] on 03/2022.  During visit patient endorsed some right-sided chest pain with shortness of breath and had been occurring for over 1 month.  His pain was not similar to his previous chest pains during 2018 and 2020.  2D echo was ordered that revealed EF of 35-40%, RWMA with moderately decreased LV function, grade 1 DD, hypokinesis of basal mid inferior septal  wall and inferior lateral wall and aortic dilation measuring 39 mm in ascending aorta.  During his visit on 05/09/2022 patient endorsed some shortness of breath with activity.  Patient had wall motion abnormalities noted on recent 2D echo and Lexiscan Myoview was arranged to rule out ischemia.  Patient also had Lasix increased for 2 days due to trace lower extremity edema  [name] presents today for 1 month follow-up alone.  Since last being seen in the office patient reports that he has been doing well with no shortness of breath or chest pain.  He does report increased congestion and mild cough with recent flu shot and COVID vaccination.  He is currently compliant with his medications and reports no adverse reactions or side effects.  He is euvolemic on examination and admits to some sodium indiscretions with his diet.  During our visit we reviewed his recent [place] results and discussed the importance of medication compliance and diet in preventing progression of heart failure.  Patient denies chest pain, palpitations, dyspnea, PND, orthopnea, nausea, vomiting, dizziness, syncope, edema, weight gain, or early satiety.  Home Medications    Current Outpatient Medications  Medication Sig Dispense Refill   atorvastatin (LIPITOR) 40 MG tablet TAKE 1 TABLET BY MOUTH ONCE DAILY AT 6PM . APPOINTMENT REQUIRED FOR FUTURE REFILLS 30 tablet 0   clopidogrel (PLAVIX) 75 MG tablet TAKE 1 TABLET BY MOUTH ONCE DAILY . APPOINTMENT REQUIRED FOR FUTURE REFILLS 90 tablet 3   Dolutegravir-lamiVUDine 50-300 MG TABS Take 1 tablet by mouth daily.      empagliflozin (JARDIANCE) 10 MG TABS tablet Take 1 tablet (10 mg total) by mouth daily before breakfast. 30 tablet 6   metoprolol succinate (TOPROL-XL) 25 MG 24 hr tablet Take 1/2 (one-half) tablet by mouth twice daily 180 tablet 3   oxyCODONE-acetaminophen (PERCOCET) 10-325 MG tablet Take 0.5-1 tablets by mouth every 4 (four) hours as needed for pain.      sacubitril-valsartan (ENTRESTO) 24-26 MG Take 1 tablet by mouth 2 (two) times daily. 60 tablet 11   furosemide (LASIX) 20 MG tablet Take 1 tablet (20 mg total) by mouth daily. Take 2 tablets a day for 2 days then one tablet daily (Patient not taking: Reported on 05/31/2022) 90 tablet 3   nitroGLYCERIN (NITROSTAT) 0.4 MG SL tablet Place 1 tablet (0.4 mg total) under the tongue every 5 (five) minutes x 3 doses as needed for chest pain. (Patient not taking: Reported on 05/31/2022) 25 tablet 6   No current facility-administered medications for this visit.     Review of Systems  Please see the history of present illness.    (+) Congestion All other systems reviewed and are otherwise negative except as noted above.  Physical Exam    Wt Readings from Last 3 Encounters:  05/31/22 170 lb (77.1 kg)  05/09/22 167 lb (75.8 kg)  04/22/22 166 lb (75.3 kg)   VS: Vitals:   05/31/22 1118  BP: 112/78  Pulse: 79  SpO2: 98%  ,Body mass index is 26.63 kg/m.  Constitutional:      Appearance: Healthy appearance. Not in distress.  Neck:     Vascular: JVD normal.  Pulmonary:     Effort: Pulmonary effort is normal.     Breath sounds: No wheezing. No rales. Diminished in the bases Cardiovascular:     Normal rate. Regular rhythm. Normal S1. Normal S2.      Murmurs: There is no murmur.  Edema:    Peripheral edema absent.  Abdominal:     Palpations: Abdomen is soft non tender. There is no hepatomegaly.  Skin:    General: Skin is warm and dry.  Neurological:     General: No focal deficit present.     Mental Status: Alert and oriented to person, place and time.     Cranial Nerves: Cranial nerves are intact.  EKG/LABS/Other Studies Reviewed    ECG personally reviewed by me today -none completed today   Lab Results  Component Value Date   WBC 10.5 11/28/2020   HGB 13.1 11/28/2020   HCT 38.3 (L) 11/28/2020   MCV 103.0 (H) 11/28/2020   PLT 296 11/28/2020   Lab Results  Component Value Date    CREATININE 0.66 (L) 05/17/2022   BUN 14 05/17/2022   NA 137 05/17/2022   K 4.2 05/17/2022   CL 100 05/17/2022   CO2 28 05/17/2022   Lab Results  Component Value Date   ALT 43 10/20/2020   AST 46 (H) 10/20/2020   ALKPHOS 83 10/20/2020   BILITOT 0.3 10/20/2020   Lab Results  Component Value Date   CHOL 135 04/22/2022   HDL 71 04/22/2022   LDLCALC 54 04/22/2022   TRIG 42 04/22/2022   CHOLHDL 1.9 04/22/2022    Lab Results  Component Value Date   HGBA1C 5.5 10/20/2020    Assessment & Plan    1.  Coronary artery disease: - s/p inferior STEMI treated with DES to RCA in 2018 -Current GDMT with Plavix 75 mg and Toprol 12.5 mg, and Lipitor 40 mg -2D echo completed recently showing wall motion abnormalities -Patient also endorses increased shortness of breath with activity. -Lexiscan Myoview completed and revealed no new ischemia but was intermediate risk with previous ischemia noted -Today patient reported no shortness of breath or angina since previous appointment   2.  Combined diastolic and systolic CHF: -Patient last 2D echo revealed EF of 35-40%, RWMA with moderately decreased LV function, grade 1 DD, hypokinesis of basal mid inferior septal wall and inferior lateral wall and aortic dilation measuring 39 mm in ascending aorta. -Patient was euvolemic on examination today and is currently tolerating medications without any adverse reactions -Current GDMT consist of Toprol 12.5 mg daily, Entresto 24/26 mg daily. -Jardiance 10 mg to be added today to optimize current GDMT and for renal protection -We will plan to add spironolactone in the future if patient tolerates current medications without any adverse reactions. -We will recheck 2D echo in 3 months for further evaluation of LV function  3.  HLD: -Continue Lipitor 40 mg -Patient's last LDL cholesterol was well controlled at 54 below goal of less than 70   4.  HTN: -Blood pressure today was well controlled today at  111/78 -Patient advised to continue low-sodium heart healthy diet   5.  Tobacco abuse: -Patient is currently smoking hookah and was advised to reduce smoking due to current shortness of breath. -Patient was advised to stop  smoking due to   6.  Ascending aortic dilation: -Blood pressure well controlled and patient continuing GDMT as noted above  Disposition: Follow-up with Sinclair Grooms, MD as scheduled    Medication Adjustments/Labs and Tests Ordered: Current medicines are reviewed at length with the patient today.  Concerns regarding medicines are outlined above.   Signed, Mable Fill, Marissa Nestle, NP 05/31/2022, 12:39 PM [facility]  Note:  This document was prepared using Dragon voice recognition software and may include unintentional dictation errors.

## 2022-05-31 ENCOUNTER — Ambulatory Visit: Payer: Medicare Other | Attending: Nurse Practitioner | Admitting: Nurse Practitioner

## 2022-05-31 ENCOUNTER — Encounter: Payer: Self-pay | Admitting: Nurse Practitioner

## 2022-05-31 VITALS — BP 112/78 | HR 79 | Ht 67.0 in | Wt 170.0 lb

## 2022-05-31 DIAGNOSIS — I25119 Atherosclerotic heart disease of native coronary artery with unspecified angina pectoris: Secondary | ICD-10-CM

## 2022-05-31 DIAGNOSIS — Z720 Tobacco use: Secondary | ICD-10-CM

## 2022-05-31 DIAGNOSIS — I77819 Aortic ectasia, unspecified site: Secondary | ICD-10-CM

## 2022-05-31 DIAGNOSIS — I10 Essential (primary) hypertension: Secondary | ICD-10-CM | POA: Diagnosis not present

## 2022-05-31 DIAGNOSIS — E785 Hyperlipidemia, unspecified: Secondary | ICD-10-CM

## 2022-05-31 DIAGNOSIS — I5042 Chronic combined systolic (congestive) and diastolic (congestive) heart failure: Secondary | ICD-10-CM

## 2022-05-31 MED ORDER — EMPAGLIFLOZIN 10 MG PO TABS: 10.0000 mg | ORAL_TABLET | Freq: Every day | ORAL | 6 refills | Status: DC

## 2022-05-31 NOTE — Patient Instructions (Addendum)
Medication Instructions:  Your physician has recommended you make the following change in your medication: Start Jardiance 10 mg by mouth daily   *If you need a refill on your cardiac medications before your next appointment, please call your pharmacy*   Lab Work: none If you have labs (blood work) drawn today and your tests are completely normal, you will receive your results only by: [place] (if you have MyChart) OR A paper copy in the mail If you have any lab test that is abnormal or we need to change your treatment, we will call you to review the results.   Testing/Procedures: none   Follow-Up: At [facility], you and your health needs are our priority.  As part of our continuing mission to provide you with exceptional heart care, we have created designated Provider Care Teams.  These Care Teams include your primary Cardiologist (physician) and Advanced Practice Providers (APPs -  Physician Assistants and Nurse Practitioners) who all work together to provide you with the care you need, when you need it.  We recommend signing up for the patient portal called "MyChart".  Sign up information is provided on this After Visit Summary.  MyChart is used to connect with patients for Virtual Visits (Telemedicine).  Patients are able to view lab/test results, encounter notes, upcoming appointments, etc.  Non-urgent messages can be sent to your provider as well.   To learn more about what you can do with MyChart, go to [email or web address].    Your next appointment:   July 24, 2022  The format for your next appointment:   In Person  Provider:   Sinclair Grooms, MD    Other Instructions    Important Information About Sugar

## 2022-06-18 ENCOUNTER — Other Ambulatory Visit: Payer: Self-pay

## 2022-06-18 MED ORDER — EMPAGLIFLOZIN 10 MG PO TABS: 10.0000 mg | ORAL_TABLET | Freq: Every day | ORAL | 3 refills | Status: DC

## 2022-06-18 MED ORDER — ATORVASTATIN CALCIUM 40 MG PO TABS: ORAL_TABLET | ORAL | 3 refills | Status: DC

## 2022-07-23 NOTE — Progress Notes (Signed)
Cardiology Office Note:    Date:  07/24/2022   ID:  Omar Sandoval, DOB [birth year], MRN [id number]  PCP:  Bartholomew Boards, MD  Cardiologist:  Lesleigh Noe, MD   Referring MD: Bartholomew Boards, MD   Chief Complaint  Patient presents with   Congestive Heart Failure   Coronary Artery Disease   Hypertension    History of Present Illness:    Omar Sandoval is a 66 y.o. male with a hx of HIV, hypertension, HLD, PVCs, chronic combined systolic and diastolic CHF.  Also has CAD status post inferior STEMI 01/2017 treated with DES to the RCA with residual 70% mid LAD and 60 to 70% distal circumflex EF 40 to 50% at that time.  Echo after cath 04/2022 EF 35 to 40%.    Current guideline directed therapy for systolic and diastolic heart failure: Toprol-XL; Jardiance; Entresto.  Is seeing progressive dyspnea on exertion as well as a burning/tightness in the chest with walking and also with emotional distress.  He has not been using nitroglycerin.  The shortness of breath and discomfort is limiting physical activity.  Past Medical History:  Diagnosis Date   HIV (human immunodeficiency virus infection) (HCC)    Immune deficiency disorder (HCC)    Inguinal hernia    Left   Myocardial infarction [facility])    01-2017 angioplasty with stents    Past Surgical History:  Procedure Laterality Date   COLONOSCOPY     CORONARY ANGIOPLASTY     CORONARY/GRAFT ACUTE MI REVASCULARIZATION N/A 02/03/2017   Procedure: Coronary/Graft Acute MI Revascularization;  Surgeon: [name], MD;  Location: [facility] INVASIVE CV LAB;  Service: Cardiovascular;  Laterality: N/A;   FRACTURE SURGERY     left ankle and right foot   HERNIA REPAIR     INGUINAL HERNIA REPAIR Left 08/27/2017   Procedure: LAPAROSCOPIC LEFT INGUINAL HERNIA REPAIR WITH MESH;  Surgeon: Axel Filler, MD;  Location: [facility] OR;  Service: General;  Laterality: Left;   INSERTION OF MESH Left 08/27/2017   Procedure: INSERTION OF MESH;  Surgeon: Axel Filler, MD;   Location: [facility] OR;  Service: General;  Laterality: Left;   LEFT HEART CATH AND CORONARY ANGIOGRAPHY N/A 02/03/2017   Procedure: Left Heart Cath and Coronary Angiography;  Surgeon: [name], MD;  Location: [facility] INVASIVE CV LAB;  Service: Cardiovascular;  Laterality: N/A;   LEFT HEART CATH AND CORONARY ANGIOGRAPHY N/A 09/25/2018   Procedure: LEFT HEART CATH AND CORONARY ANGIOGRAPHY;  Surgeon: [name], MD;  Location: [facility] INVASIVE CV LAB;  Service: Cardiovascular;  Laterality: N/A;   OPEN REDUCTION INTERNAL FIXATION (ORIF) DISTAL RADIAL FRACTURE Right 07/20/2016   Procedure: OPEN REDUCTION INTERNAL FIXATION (ORIF) DISTAL RADIAL FRACTURE;  Surgeon: [name], MD;  Location: [facility] OR;  Service: Orthopedics;  Laterality: Right;   SURGERY SCROTAL / TESTICULAR     nondecended testes    Current Medications: Current Meds  Medication Sig   atorvastatin (LIPITOR) 40 MG tablet TAKE 1 TABLET BY MOUTH ONCE DAILY AT 6PM .   clopidogrel (PLAVIX) 75 MG tablet TAKE 1 TABLET BY MOUTH ONCE DAILY . APPOINTMENT REQUIRED FOR FUTURE REFILLS   Dolutegravir-lamiVUDine 50-300 MG TABS Take 1 tablet by mouth daily.    empagliflozin (JARDIANCE) 10 MG TABS tablet Take 1 tablet (10 mg total) by mouth daily before breakfast.   furosemide (LASIX) 20 MG tablet Take 1 tablet (20 mg total) by mouth daily. Take 2 tablets a day for 2 days then one tablet daily  metoprolol succinate (TOPROL-XL) 25 MG 24 hr tablet Take 1/2 (one-half) tablet by mouth twice daily   nitroGLYCERIN (NITROSTAT) 0.4 MG SL tablet Place 1 tablet (0.4 mg total) under the tongue every 5 (five) minutes x 3 doses as needed for chest pain.   oxyCODONE-acetaminophen (PERCOCET) 10-325 MG tablet Take 0.5-1 tablets by mouth every 4 (four) hours as needed for pain.   sacubitril-valsartan (ENTRESTO) 24-26 MG Take 1 tablet by mouth 2 (two) times daily.     Allergies:   Chocolate, Lactose intolerance (gi), Milk (cow), and Vicodin [hydrocodone-acetaminophen]    Social History   Socioeconomic History   Marital status: Single    Spouse name: Not on file   Number of children: Not on file   Years of education: Not on file   Highest education level: Not on file  Occupational History   Not on file  Tobacco Use   Smoking status: Former    Packs/day: 0.50    Years: 45.00    Total pack years: 22.50    Types: Cigarettes    Quit date: 2013    Years since quitting: 10.9   Smokeless tobacco: Never  Vaping Use   Vaping Use: Never used  Substance and Sexual Activity   Alcohol use: Yes    Alcohol/week: 3.0 standard drinks of alcohol    Types: 3 Cans of beer per week    Comment: beer daily   Drug use: Yes    Types: Marijuana    Comment: used  Marijuana   Sexual activity: Not on file  Other Topics Concern   Not on file  Social History Narrative   Not on file   Social Determinants of Health   Financial Resource Strain: Not on file  Food Insecurity: Not on file  Transportation Needs: Not on file  Physical Activity: Not on file  Stress: Not on file  Social Connections: Not on file     Family History: The patient's family history includes Cancer in his brother and mother.  ROS:   Please see the history of present illness.    Has not had syncope.  Not using nitroglycerin.  All other systems reviewed and are negative.  EKGs/Labs/Other Studies Reviewed:    The following studies were reviewed today: CORONARY angiography 2020: Severe LAD disease has been treated medically Borderline significant distal left main  Echocardiogram September 2023: IMPRESSIONS   1. Left ventricular ejection fraction, by estimation, is 35 to 40%. Left  ventricular ejection fraction by 3D volume is 32 %. The left ventricle has  moderately decreased function. The left ventricle demonstrates regional  wall motion abnormalities (see  scoring diagram/findings for description). The left ventricular internal  cavity size was mildly dilated. Left ventricular  diastolic parameters are  consistent with Grade I diastolic dysfunction (impaired relaxation). There  is akinesis of the left  ventricular, entire inferior wall. There is hypokinesis of the left  ventricular, basal-mid inferoseptal wall and inferolateral wall.   2. Right ventricular systolic function is normal. The right ventricular  size is normal. Tricuspid regurgitation signal is inadequate for assessing  PA pressure.   3. The mitral valve is normal in structure. No evidence of mitral valve  regurgitation.   4. The aortic valve is tricuspid. There is moderate calcification of the  aortic valve. There is moderate thickening of the aortic valve. Aortic  valve regurgitation is not visualized. Aortic valve  sclerosis/calcification is present, without any evidence  of aortic stenosis.   5. Aortic dilatation noted. There   is borderline dilatation of the aortic  root, measuring 39 mm. There is mild dilatation of the ascending aorta,  measuring 40 mm.   6. The inferior vena cava is normal in size with greater than 50%  respiratory variability, suggesting right atrial pressure of 3 mmHg.   Comparison(s): The left ventricular function is worsened. The left  ventricular wall motion abnormality is worse.    EKG:  EKG formed April 22, 2022 and demonstrated normal sinus rhythm, left axis deviation, right bundle branch block, inferior Q waves.  No changes noted when compared to a tracing done immediately prior in April 2022.  EKG performed today demonstrates right bundle, old inferior infarct, left axis deviation, and no significant change from the prior tracings.  Recent Labs: 05/17/2022: BUN 14; Creatinine, Ser 0.66; Potassium 4.2; Sodium 137  Recent Lipid Panel    Component Value Date/Time   CHOL 135 04/22/2022 0953   TRIG 42 04/22/2022 0953   HDL 71 04/22/2022 0953   CHOLHDL 1.9 04/22/2022 0953   CHOLHDL 3.3 02/03/2017 1323   VLDL 8 02/03/2017 1323   LDLCALC 54 04/22/2022 0953     Physical Exam:    VS:  BP 110/72   Pulse 83   Ht 5\' 7"  (1.702 m)   Wt 165 lb 3.2 oz (74.9 kg)   SpO2 99%   BMI 25.87 kg/m     Wt Readings from Last 3 Encounters:  07/24/22 165 lb 3.2 oz (74.9 kg)  05/31/22 170 lb (77.1 kg)  05/09/22 167 lb (75.8 kg)     GEN: Weight remains stable.. No acute distress HEENT: Normal NECK: No JVD. LYMPHATICS: No lymphadenopathy CARDIAC: No murmur.  Irregular RR and a trigeminal pattern no gallop, or edema. VASCULAR:  Normal Pulses. No bruits. RESPIRATORY:  Clear to auscultation without rales, wheezing or rhonchi  ABDOMEN: Soft, non-tender, non-distended, No pulsatile mass, MUSCULOSKELETAL: No deformity  SKIN: Warm and dry NEUROLOGIC:  Alert and oriented x 3 PSYCHIATRIC:  Normal affect   ASSESSMENT:    1. Coronary artery disease involving native coronary artery of native heart with angina pectoris (HCC)   2. Chronic combined systolic and diastolic heart failure (HCC)   3. Hyperlipidemia, unspecified hyperlipidemia type   4. Essential hypertension   5. Tobacco abuse   6. OSA on CPAP   7. Acquired dilation of ascending aorta and aortic root (HCC)    PLAN:    In order of problems listed above:  After doing his catheterization from 3 years ago, he needs repeat coronary angiography to rule out progression of left main and LAD disease.  He may even have surgical anatomy.  The procedure and risks were discussed in detail.  He is willing to proceed. Needs left and right heart catheterization. Continue aggressive statin therapy Blood pressure is under good control on the current heart failure regimen. Strongly encouraged smoking cessation Encouraged CPAP compliance   The patient was counseled to undergo left and right heart catheterization, coronary angiography, and possible percutaneous coronary intervention with stent implantation. The procedural risks and benefits were discussed in detail. The risks discussed included death, stroke,  myocardial infarction, life-threatening bleeding, limb ischemia, kidney injury, allergy, and possible emergency cardiac surgery. The risk of these significant complications were estimated to occur less than 1% of the time. After discussion, the patient has agreed to proceed.    Medication Adjustments/Labs and Tests Ordered: Current medicines are reviewed at length with the patient today.  Concerns regarding medicines are outlined above.  Orders Placed  This Encounter  Procedures   CBC   Basic metabolic panel   EKG 12-Lead   No orders of the defined types were placed in this encounter.   Patient Instructions  Medication Instructions:  Your physician recommends that you continue on your current medications as directed. Please refer to the Current Medication list given to you today.  *If you need a refill on your cardiac medications before your next appointment, please call your pharmacy*  Lab Work: TODAY: CBC, BMET If you have labs (blood work) drawn today and your tests are completely normal, you will receive your results only by: MyChart Message (if you have MyChart) OR A paper copy in the mail If you have any lab test that is abnormal or we need to change your treatment, we will call you to review the results.  Follow-Up: Will be scheduled after your heart catheterization  Other Instructions       Cardiac/Peripheral Catheterization   You are scheduled for a Cardiac Catheterization on Friday, December 8 with Dr. Verdis Prime.  1. Please arrive at the Main Entrance A at [facility]: [address] [place], [place] [address] on December 8 at 11:00 AM (This time is two hours before your procedure to ensure your preparation). Free valet parking service is available. You will check in at ADMITTING. The support person will be asked to wait in the waiting room.  It is OK to have someone drop you off and come back when you are ready to be discharged.        Special note:  Every effort is made to have your procedure done on time. Please understand that emergencies sometimes delay scheduled procedures.   . 2. Diet: Do not eat solid foods after midnight.  You may have clear liquids until 5 AM the day of the procedure.  3. Labs: You will need to have blood drawn today, 07/24/2022.  4. Medication instructions in preparation for your procedure:   Contrast Allergy: No  Stop taking, Lasix (Furosemide)  Friday, December 8,  On the morning of your procedure, take Aspirin 81 mg and Plavix/Clopidogrel and any morning medicines NOT listed above.  You may use sips of water.  5. Plan to go home the same day, you will only stay overnight if medically necessary. 6. You MUST have a responsible adult to drive you home. 7. An adult MUST be with you the first 24 hours after you arrive home. 8. Bring a current list of your medications, and the last time and date medication taken. 9. Bring ID and current insurance cards. 10.Please wear clothes that are easy to get on and off and wear slip-on shoes.  Thank you for allowing [place] to care for you!   -- [facility]   Important Information About Sugar         Signed, Lesleigh Noe, MD  07/24/2022 11:16 AM    [facility]

## 2022-07-23 NOTE — H&P (View-Only) (Signed)
Cardiology Office Note:    Date:  07/24/2022   ID:  Omar Sandoval, DOB [birth year], MRN [id number]  PCP:  Bartholomew Boards, MD  Cardiologist:  Lesleigh Noe, MD   Referring MD: Bartholomew Boards, MD   Chief Complaint  Patient presents with   Congestive Heart Failure   Coronary Artery Disease   Hypertension    History of Present Illness:    Omar Sandoval is a 66 y.o. male with a hx of HIV, hypertension, HLD, PVCs, chronic combined systolic and diastolic CHF.  Also has CAD status post inferior STEMI 01/2017 treated with DES to the RCA with residual 70% mid LAD and 60 to 70% distal circumflex EF 40 to 50% at that time.  Echo after cath 04/2022 EF 35 to 40%.    Current guideline directed therapy for systolic and diastolic heart failure: Toprol-XL; Jardiance; Entresto.  Is seeing progressive dyspnea on exertion as well as a burning/tightness in the chest with walking and also with emotional distress.  He has not been using nitroglycerin.  The shortness of breath and discomfort is limiting physical activity.  Past Medical History:  Diagnosis Date   HIV (human immunodeficiency virus infection) (HCC)    Immune deficiency disorder (HCC)    Inguinal hernia    Left   Myocardial infarction [facility])    01-2017 angioplasty with stents    Past Surgical History:  Procedure Laterality Date   COLONOSCOPY     CORONARY ANGIOPLASTY     CORONARY/GRAFT ACUTE MI REVASCULARIZATION N/A 02/03/2017   Procedure: Coronary/Graft Acute MI Revascularization;  Surgeon: [name], MD;  Location: [facility] INVASIVE CV LAB;  Service: Cardiovascular;  Laterality: N/A;   FRACTURE SURGERY     left ankle and right foot   HERNIA REPAIR     INGUINAL HERNIA REPAIR Left 08/27/2017   Procedure: LAPAROSCOPIC LEFT INGUINAL HERNIA REPAIR WITH MESH;  Surgeon: Axel Filler, MD;  Location: [facility] OR;  Service: General;  Laterality: Left;   INSERTION OF MESH Left 08/27/2017   Procedure: INSERTION OF MESH;  Surgeon: Axel Filler, MD;   Location: [facility] OR;  Service: General;  Laterality: Left;   LEFT HEART CATH AND CORONARY ANGIOGRAPHY N/A 02/03/2017   Procedure: Left Heart Cath and Coronary Angiography;  Surgeon: [name], MD;  Location: [facility] INVASIVE CV LAB;  Service: Cardiovascular;  Laterality: N/A;   LEFT HEART CATH AND CORONARY ANGIOGRAPHY N/A 09/25/2018   Procedure: LEFT HEART CATH AND CORONARY ANGIOGRAPHY;  Surgeon: [name], MD;  Location: [facility] INVASIVE CV LAB;  Service: Cardiovascular;  Laterality: N/A;   OPEN REDUCTION INTERNAL FIXATION (ORIF) DISTAL RADIAL FRACTURE Right 07/20/2016   Procedure: OPEN REDUCTION INTERNAL FIXATION (ORIF) DISTAL RADIAL FRACTURE;  Surgeon: [name], MD;  Location: [facility] OR;  Service: Orthopedics;  Laterality: Right;   SURGERY SCROTAL / TESTICULAR     nondecended testes    Current Medications: Current Meds  Medication Sig   atorvastatin (LIPITOR) 40 MG tablet TAKE 1 TABLET BY MOUTH ONCE DAILY AT 6PM .   clopidogrel (PLAVIX) 75 MG tablet TAKE 1 TABLET BY MOUTH ONCE DAILY . APPOINTMENT REQUIRED FOR FUTURE REFILLS   Dolutegravir-lamiVUDine 50-300 MG TABS Take 1 tablet by mouth daily.    empagliflozin (JARDIANCE) 10 MG TABS tablet Take 1 tablet (10 mg total) by mouth daily before breakfast.   furosemide (LASIX) 20 MG tablet Take 1 tablet (20 mg total) by mouth daily. Take 2 tablets a day for 2 days then one tablet daily  metoprolol succinate (TOPROL-XL) 25 MG 24 hr tablet Take 1/2 (one-half) tablet by mouth twice daily   nitroGLYCERIN (NITROSTAT) 0.4 MG SL tablet Place 1 tablet (0.4 mg total) under the tongue every 5 (five) minutes x 3 doses as needed for chest pain.   oxyCODONE-acetaminophen (PERCOCET) 10-325 MG tablet Take 0.5-1 tablets by mouth every 4 (four) hours as needed for pain.   sacubitril-valsartan (ENTRESTO) 24-26 MG Take 1 tablet by mouth 2 (two) times daily.     Allergies:   Chocolate, Lactose intolerance (gi), Milk (cow), and Vicodin [hydrocodone-acetaminophen]    Social History   Socioeconomic History   Marital status: Single    Spouse name: Not on file   Number of children: Not on file   Years of education: Not on file   Highest education level: Not on file  Occupational History   Not on file  Tobacco Use   Smoking status: Former    Packs/day: 0.50    Years: 45.00    Total pack years: 22.50    Types: Cigarettes    Quit date: 2013    Years since quitting: 10.9   Smokeless tobacco: Never  Vaping Use   Vaping Use: Never used  Substance and Sexual Activity   Alcohol use: Yes    Alcohol/week: 3.0 standard drinks of alcohol    Types: 3 Cans of beer per week    Comment: beer daily   Drug use: Yes    Types: Marijuana    Comment: used  Marijuana   Sexual activity: Not on file  Other Topics Concern   Not on file  Social History Narrative   Not on file   Social Determinants of Health   Financial Resource Strain: Not on file  Food Insecurity: Not on file  Transportation Needs: Not on file  Physical Activity: Not on file  Stress: Not on file  Social Connections: Not on file     Family History: The patient's family history includes Cancer in his brother and mother.  ROS:   Please see the history of present illness.    Has not had syncope.  Not using nitroglycerin.  All other systems reviewed and are negative.  EKGs/Labs/Other Studies Reviewed:    The following studies were reviewed today: CORONARY angiography 2020: Severe LAD disease has been treated medically Borderline significant distal left main  Echocardiogram September 2023: IMPRESSIONS   1. Left ventricular ejection fraction, by estimation, is 35 to 40%. Left  ventricular ejection fraction by 3D volume is 32 %. The left ventricle has  moderately decreased function. The left ventricle demonstrates regional  wall motion abnormalities (see  scoring diagram/findings for description). The left ventricular internal  cavity size was mildly dilated. Left ventricular  diastolic parameters are  consistent with Grade I diastolic dysfunction (impaired relaxation). There  is akinesis of the left  ventricular, entire inferior wall. There is hypokinesis of the left  ventricular, basal-mid inferoseptal wall and inferolateral wall.   2. Right ventricular systolic function is normal. The right ventricular  size is normal. Tricuspid regurgitation signal is inadequate for assessing  PA pressure.   3. The mitral valve is normal in structure. No evidence of mitral valve  regurgitation.   4. The aortic valve is tricuspid. There is moderate calcification of the  aortic valve. There is moderate thickening of the aortic valve. Aortic  valve regurgitation is not visualized. Aortic valve  sclerosis/calcification is present, without any evidence  of aortic stenosis.   5. Aortic dilatation noted. There  is borderline dilatation of the aortic  root, measuring 39 mm. There is mild dilatation of the ascending aorta,  measuring 40 mm.   6. The inferior vena cava is normal in size with greater than 50%  respiratory variability, suggesting right atrial pressure of 3 mmHg.   Comparison(s): The left ventricular function is worsened. The left  ventricular wall motion abnormality is worse.    EKG:  EKG formed April 22, 2022 and demonstrated normal sinus rhythm, left axis deviation, right bundle branch block, inferior Q waves.  No changes noted when compared to a tracing done immediately prior in April 2022.  EKG performed today demonstrates right bundle, old inferior infarct, left axis deviation, and no significant change from the prior tracings.  Recent Labs: 05/17/2022: BUN 14; Creatinine, Ser 0.66; Potassium 4.2; Sodium 137  Recent Lipid Panel    Component Value Date/Time   CHOL 135 04/22/2022 0953   TRIG 42 04/22/2022 0953   HDL 71 04/22/2022 0953   CHOLHDL 1.9 04/22/2022 0953   CHOLHDL 3.3 02/03/2017 1323   VLDL 8 02/03/2017 1323   LDLCALC 54 04/22/2022 0953     Physical Exam:    VS:  BP 110/72   Pulse 83   Ht 5\' 7"  (1.702 m)   Wt 165 lb 3.2 oz (74.9 kg)   SpO2 99%   BMI 25.87 kg/m     Wt Readings from Last 3 Encounters:  07/24/22 165 lb 3.2 oz (74.9 kg)  05/31/22 170 lb (77.1 kg)  05/09/22 167 lb (75.8 kg)     GEN: Weight remains stable.. No acute distress HEENT: Normal NECK: No JVD. LYMPHATICS: No lymphadenopathy CARDIAC: No murmur.  Irregular RR and a trigeminal pattern no gallop, or edema. VASCULAR:  Normal Pulses. No bruits. RESPIRATORY:  Clear to auscultation without rales, wheezing or rhonchi  ABDOMEN: Soft, non-tender, non-distended, No pulsatile mass, MUSCULOSKELETAL: No deformity  SKIN: Warm and dry NEUROLOGIC:  Alert and oriented x 3 PSYCHIATRIC:  Normal affect   ASSESSMENT:    1. Coronary artery disease involving native coronary artery of native heart with angina pectoris (HCC)   2. Chronic combined systolic and diastolic heart failure (HCC)   3. Hyperlipidemia, unspecified hyperlipidemia type   4. Essential hypertension   5. Tobacco abuse   6. OSA on CPAP   7. Acquired dilation of ascending aorta and aortic root (HCC)    PLAN:    In order of problems listed above:  After doing his catheterization from 3 years ago, he needs repeat coronary angiography to rule out progression of left main and LAD disease.  He may even have surgical anatomy.  The procedure and risks were discussed in detail.  He is willing to proceed. Needs left and right heart catheterization. Continue aggressive statin therapy Blood pressure is under good control on the current heart failure regimen. Strongly encouraged smoking cessation Encouraged CPAP compliance   The patient was counseled to undergo left and right heart catheterization, coronary angiography, and possible percutaneous coronary intervention with stent implantation. The procedural risks and benefits were discussed in detail. The risks discussed included death, stroke,  myocardial infarction, life-threatening bleeding, limb ischemia, kidney injury, allergy, and possible emergency cardiac surgery. The risk of these significant complications were estimated to occur less than 1% of the time. After discussion, the patient has agreed to proceed.    Medication Adjustments/Labs and Tests Ordered: Current medicines are reviewed at length with the patient today.  Concerns regarding medicines are outlined above.  Orders Placed  This Encounter  Procedures   CBC   Basic metabolic panel   EKG 12-Lead   No orders of the defined types were placed in this encounter.   Patient Instructions  Medication Instructions:  Your physician recommends that you continue on your current medications as directed. Please refer to the Current Medication list given to you today.  *If you need a refill on your cardiac medications before your next appointment, please call your pharmacy*  Lab Work: TODAY: CBC, BMET If you have labs (blood work) drawn today and your tests are completely normal, you will receive your results only by: MyChart Message (if you have MyChart) OR A paper copy in the mail If you have any lab test that is abnormal or we need to change your treatment, we will call you to review the results.  Follow-Up: Will be scheduled after your heart catheterization  Other Instructions       Cardiac/Peripheral Catheterization   You are scheduled for a Cardiac Catheterization on Friday, December 8 with Dr. Verdis Prime.  1. Please arrive at the Main Entrance A at [facility]: [address] [place], [place] [address] on December 8 at 11:00 AM (This time is two hours before your procedure to ensure your preparation). Free valet parking service is available. You will check in at ADMITTING. The support person will be asked to wait in the waiting room.  It is OK to have someone drop you off and come back when you are ready to be discharged.        Special note:  Every effort is made to have your procedure done on time. Please understand that emergencies sometimes delay scheduled procedures.   . 2. Diet: Do not eat solid foods after midnight.  You may have clear liquids until 5 AM the day of the procedure.  3. Labs: You will need to have blood drawn today, 07/24/2022.  4. Medication instructions in preparation for your procedure:   Contrast Allergy: No  Stop taking, Lasix (Furosemide)  Friday, December 8,  On the morning of your procedure, take Aspirin 81 mg and Plavix/Clopidogrel and any morning medicines NOT listed above.  You may use sips of water.  5. Plan to go home the same day, you will only stay overnight if medically necessary. 6. You MUST have a responsible adult to drive you home. 7. An adult MUST be with you the first 24 hours after you arrive home. 8. Bring a current list of your medications, and the last time and date medication taken. 9. Bring ID and current insurance cards. 10.Please wear clothes that are easy to get on and off and wear slip-on shoes.  Thank you for allowing [place] to care for you!   -- [place] [facility]   Important Information About Sugar         Signed, Lesleigh Noe, MD  07/24/2022 11:16 AM    [facility]

## 2022-07-24 ENCOUNTER — Ambulatory Visit: Payer: Medicare Other | Attending: Interventional Cardiology | Admitting: Interventional Cardiology

## 2022-07-24 ENCOUNTER — Encounter: Payer: Self-pay | Admitting: Interventional Cardiology

## 2022-07-24 VITALS — BP 110/72 | HR 83 | Ht 67.0 in | Wt 165.2 lb

## 2022-07-24 DIAGNOSIS — I10 Essential (primary) hypertension: Secondary | ICD-10-CM

## 2022-07-24 DIAGNOSIS — G4733 Obstructive sleep apnea (adult) (pediatric): Secondary | ICD-10-CM

## 2022-07-24 DIAGNOSIS — Z720 Tobacco use: Secondary | ICD-10-CM

## 2022-07-24 DIAGNOSIS — I25119 Atherosclerotic heart disease of native coronary artery with unspecified angina pectoris: Secondary | ICD-10-CM | POA: Diagnosis not present

## 2022-07-24 DIAGNOSIS — I5042 Chronic combined systolic (congestive) and diastolic (congestive) heart failure: Secondary | ICD-10-CM

## 2022-07-24 DIAGNOSIS — I77819 Aortic ectasia, unspecified site: Secondary | ICD-10-CM

## 2022-07-24 DIAGNOSIS — E785 Hyperlipidemia, unspecified: Secondary | ICD-10-CM

## 2022-07-24 LAB — BASIC METABOLIC PANEL
BUN/Creatinine Ratio: 15 (ref 10–24)
BUN: 10 mg/dL (ref 8–27)
CO2: 24 mmol/L (ref 20–29)
Calcium: 9.6 mg/dL (ref 8.6–10.2)
Chloride: 101 mmol/L (ref 96–106)
Creatinine, Ser: 0.65 mg/dL — ABNORMAL LOW (ref 0.76–1.27)
Glucose: 76 mg/dL (ref 70–99)
Potassium: 3.9 mmol/L (ref 3.5–5.2)
Sodium: 137 mmol/L (ref 134–144)
eGFR: 105 mL/min/{1.73_m2} (ref >59)

## 2022-07-24 LAB — CBC
Hematocrit: 38.7 % (ref 37.5–51.0)
Hemoglobin: 13.5 g/dL (ref 13.0–17.7)
MCH: 35.4 pg — ABNORMAL HIGH (ref 26.6–33.0)
MCHC: 34.9 g/dL (ref 31.5–35.7)
MCV: 102 fL — ABNORMAL HIGH (ref 79–97)
Platelets: 301 10*3/uL (ref 150–450)
RBC: 3.81 x10E6/uL — ABNORMAL LOW (ref 4.14–5.80)
RDW: 14.6 % (ref 11.6–15.4)
WBC: 4.8 10*3/uL (ref 3.4–10.8)

## 2022-07-24 NOTE — Patient Instructions (Signed)
Medication Instructions:  Your physician recommends that you continue on your current medications as directed. Please refer to the Current Medication list given to you today.  *If you need a refill on your cardiac medications before your next appointment, please call your pharmacy*  Lab Work: TODAY: CBC, BMET If you have labs (blood work) drawn today and your tests are completely normal, you will receive your results only by: MyChart Message (if you have MyChart) OR A paper copy in the mail If you have any lab test that is abnormal or we need to change your treatment, we will call you to review the results.  Follow-Up: Will be scheduled after your heart catheterization  Other Instructions       Cardiac/Peripheral Catheterization   You are scheduled for a Cardiac Catheterization on Friday, December 8 with Dr. Verdis Prime.  1. Please arrive at the Main Entrance A at [facility]: [address] [place], [place] [address] on December 8 at 11:00 AM (This time is two hours before your procedure to ensure your preparation). Free valet parking service is available. You will check in at ADMITTING. The support person will be asked to wait in the waiting room.  It is OK to have someone drop you off and come back when you are ready to be discharged.        Special note: Every effort is made to have your procedure done on time. Please understand that emergencies sometimes delay scheduled procedures.   . 2. Diet: Do not eat solid foods after midnight.  You may have clear liquids until 5 AM the day of the procedure.  3. Labs: You will need to have blood drawn today, 07/24/2022.  4. Medication instructions in preparation for your procedure:   Contrast Allergy: No  Stop taking, Lasix (Furosemide)  Friday, December 8,  On the morning of your procedure, take Aspirin 81 mg and Plavix/Clopidogrel and any morning medicines NOT listed above.  You may use sips of water.  5. Plan to go home  the same day, you will only stay overnight if medically necessary. 6. You MUST have a responsible adult to drive you home. 7. An adult MUST be with you the first 24 hours after you arrive home. 8. Bring a current list of your medications, and the last time and date medication taken. 9. Bring ID and current insurance cards. 10.Please wear clothes that are easy to get on and off and wear slip-on shoes.  Thank you for allowing [place] to care for you!   -- [place] [facility]   Important Information About Sugar

## 2022-07-29 ENCOUNTER — Telehealth: Payer: Self-pay | Admitting: Interventional Cardiology

## 2022-07-29 NOTE — Telephone Encounter (Signed)
Returned call to patient, reviewed heart cath instructions with patient and informed him he will need to arrive at [facility] at 11:00am. Discussed lab results, OK to proceed with heart cath.  Patient verbalized understanding and expressed appreciation for call.

## 2022-07-29 NOTE — Telephone Encounter (Signed)
Patient calling to find out what time he needs to be at the hospital 12/8 for his procedure.

## 2022-08-01 ENCOUNTER — Telehealth: Payer: Self-pay | Admitting: *Deleted

## 2022-08-01 NOTE — Telephone Encounter (Signed)
Cardiac Catheterization scheduled at [facility] for: Friday August 02, 2022 1 PM Arrival time and place: [facility] Main Entrance A at: 11 AM  Nothing to eat after midnight prior to procedure, clear liquids until 5 AM day of procedure.  Medication instructions: -Hold:  Jardiance-pt reports he is going to hold day before and day of procedure  Lasix-pt reports he is not taking -Except hold medications usual morning medications can be taken with sips of water including aspirin 81 mg and Plavix 75 mg.  Confirmed patient has responsible adult to drive home post procedure and be with patient first 24 hours after arriving home.  Patient reports no new symptoms concerning for COVID-19 in the past 10 days.  Reviewed procedure instructions with patient.

## 2022-08-01 NOTE — H&P (Signed)
Prior RCA mid stent Mod LM, LAD Decreased LV function

## 2022-08-02 ENCOUNTER — Other Ambulatory Visit: Payer: Self-pay

## 2022-08-02 ENCOUNTER — Ambulatory Visit (HOSPITAL_COMMUNITY)
Admission: RE | Admit: 2022-08-02 | Discharge: 2022-08-02 | Disposition: A | Discharge status: Home / Self Care | Payer: Medicare Other | Source: Ambulatory Visit | Attending: Interventional Cardiology | Admitting: Interventional Cardiology

## 2022-08-02 ENCOUNTER — Encounter (HOSPITAL_COMMUNITY)
Admission: RE | Disposition: A | Discharge status: Home / Self Care | Payer: Self-pay | Source: Ambulatory Visit | Attending: Interventional Cardiology

## 2022-08-02 DIAGNOSIS — Z21 Asymptomatic human immunodeficiency virus [HIV] infection status: Secondary | ICD-10-CM | POA: Insufficient documentation

## 2022-08-02 DIAGNOSIS — I110 Hypertensive heart disease with heart failure: Secondary | ICD-10-CM | POA: Diagnosis not present

## 2022-08-02 DIAGNOSIS — I5042 Chronic combined systolic (congestive) and diastolic (congestive) heart failure: Secondary | ICD-10-CM | POA: Diagnosis not present

## 2022-08-02 DIAGNOSIS — Z7902 Long term (current) use of antithrombotics/antiplatelets: Secondary | ICD-10-CM | POA: Insufficient documentation

## 2022-08-02 DIAGNOSIS — T82855A Stenosis of coronary artery stent, initial encounter: Secondary | ICD-10-CM | POA: Diagnosis not present

## 2022-08-02 DIAGNOSIS — I252 Old myocardial infarction: Secondary | ICD-10-CM | POA: Insufficient documentation

## 2022-08-02 DIAGNOSIS — G4733 Obstructive sleep apnea (adult) (pediatric): Secondary | ICD-10-CM | POA: Diagnosis not present

## 2022-08-02 DIAGNOSIS — Z7982 Long term (current) use of aspirin: Secondary | ICD-10-CM | POA: Diagnosis not present

## 2022-08-02 DIAGNOSIS — Z955 Presence of coronary angioplasty implant and graft: Secondary | ICD-10-CM | POA: Diagnosis not present

## 2022-08-02 DIAGNOSIS — Z79899 Other long term (current) drug therapy: Secondary | ICD-10-CM | POA: Diagnosis not present

## 2022-08-02 DIAGNOSIS — I77819 Aortic ectasia, unspecified site: Secondary | ICD-10-CM | POA: Diagnosis not present

## 2022-08-02 DIAGNOSIS — Z87891 Personal history of nicotine dependence: Secondary | ICD-10-CM | POA: Diagnosis not present

## 2022-08-02 DIAGNOSIS — E785 Hyperlipidemia, unspecified: Secondary | ICD-10-CM | POA: Insufficient documentation

## 2022-08-02 DIAGNOSIS — I25119 Atherosclerotic heart disease of native coronary artery with unspecified angina pectoris: Secondary | ICD-10-CM | POA: Diagnosis not present

## 2022-08-02 DIAGNOSIS — Y832 Surgical operation with anastomosis, bypass or graft as the cause of abnormal reaction of the patient, or of later complication, without mention of misadventure at the time of the procedure: Secondary | ICD-10-CM | POA: Diagnosis not present

## 2022-08-02 DIAGNOSIS — I255 Ischemic cardiomyopathy: Secondary | ICD-10-CM | POA: Diagnosis present

## 2022-08-02 DIAGNOSIS — I2510 Atherosclerotic heart disease of native coronary artery without angina pectoris: Secondary | ICD-10-CM | POA: Diagnosis present

## 2022-08-02 HISTORY — PX: RIGHT/LEFT HEART CATH AND CORONARY ANGIOGRAPHY: CATH118266

## 2022-08-02 LAB — POCT I-STAT EG7
Acid-base deficit: 2 mmol/L (ref 0.0–2.0)
Acid-base deficit: 2 mmol/L (ref 0.0–2.0)
Bicarbonate: 24.2 mmol/L (ref 20.0–28.0)
Bicarbonate: 24.5 mmol/L (ref 20.0–28.0)
Calcium, Ion: 1.25 mmol/L (ref 1.15–1.40)
Calcium, Ion: 1.25 mmol/L (ref 1.15–1.40)
HCT: 33 % — ABNORMAL LOW (ref 39.0–52.0)
HCT: 33 % — ABNORMAL LOW (ref 39.0–52.0)
Hemoglobin: 11.2 g/dL — ABNORMAL LOW (ref 13.0–17.0)
Hemoglobin: 11.2 g/dL — ABNORMAL LOW (ref 13.0–17.0)
O2 Saturation: 64 %
O2 Saturation: 66 %
Potassium: 3.6 mmol/L (ref 3.5–5.1)
Potassium: 3.6 mmol/L (ref 3.5–5.1)
Sodium: 137 mmol/L (ref 135–145)
Sodium: 137 mmol/L (ref 135–145)
TCO2: 26 mmol/L (ref 22–32)
TCO2: 26 mmol/L (ref 22–32)
pCO2, Ven: 45.2 mmHg (ref 44–60)
pCO2, Ven: 45.9 mmHg (ref 44–60)
pH, Ven: 7.334 (ref 7.25–7.43)
pH, Ven: 7.337 (ref 7.25–7.43)
pO2, Ven: 36 mmHg (ref 32–45)
pO2, Ven: 37 mmHg (ref 32–45)

## 2022-08-02 LAB — POCT I-STAT 7, (LYTES, BLD GAS, ICA,H+H)
Acid-base deficit: 3 mmol/L — ABNORMAL HIGH (ref 0.0–2.0)
Acid-base deficit: 4 mmol/L — ABNORMAL HIGH (ref 0.0–2.0)
Bicarbonate: 22.4 mmol/L (ref 20.0–28.0)
Bicarbonate: 22.9 mmol/L (ref 20.0–28.0)
Calcium, Ion: 1.22 mmol/L (ref 1.15–1.40)
Calcium, Ion: 1.23 mmol/L (ref 1.15–1.40)
HCT: 31 % — ABNORMAL LOW (ref 39.0–52.0)
HCT: 32 % — ABNORMAL LOW (ref 39.0–52.0)
Hemoglobin: 10.5 g/dL — ABNORMAL LOW (ref 13.0–17.0)
Hemoglobin: 10.9 g/dL — ABNORMAL LOW (ref 13.0–17.0)
O2 Saturation: 85 %
O2 Saturation: 90 %
Potassium: 3.5 mmol/L (ref 3.5–5.1)
Potassium: 3.5 mmol/L (ref 3.5–5.1)
Sodium: 133 mmol/L — ABNORMAL LOW (ref 135–145)
Sodium: 134 mmol/L — ABNORMAL LOW (ref 135–145)
TCO2: 24 mmol/L (ref 22–32)
TCO2: 24 mmol/L (ref 22–32)
pCO2 arterial: 43.1 mmHg (ref 32–48)
pCO2 arterial: 43.9 mmHg (ref 32–48)
pH, Arterial: 7.316 — ABNORMAL LOW (ref 7.35–7.45)
pH, Arterial: 7.334 — ABNORMAL LOW (ref 7.35–7.45)
pO2, Arterial: 55 mmHg — ABNORMAL LOW (ref 83–108)
pO2, Arterial: 64 mmHg — ABNORMAL LOW (ref 83–108)

## 2022-08-02 SURGERY — RIGHT/LEFT HEART CATH AND CORONARY ANGIOGRAPHY
Anesthesia: LOCAL

## 2022-08-02 MED ORDER — HYDRALAZINE HCL 20 MG/ML IJ SOLN: 10.0000 mg | INTRAMUSCULAR | Status: DC | PRN

## 2022-08-02 MED ORDER — SODIUM CHLORIDE 0.9% FLUSH: 3.0000 mL | Freq: Two times a day (BID) | INTRAVENOUS | Status: DC

## 2022-08-02 MED ORDER — CLOPIDOGREL BISULFATE 75 MG PO TABS: 75.0000 mg | ORAL_TABLET | ORAL | Status: DC

## 2022-08-02 MED ORDER — FENTANYL CITRATE (PF) 100 MCG/2ML IJ SOLN
INTRAMUSCULAR | Status: AC
  Filled 2022-08-02: qty 2

## 2022-08-02 MED ORDER — HEPARIN SODIUM (PORCINE) 1000 UNIT/ML IJ SOLN
INTRAMUSCULAR | Status: AC
  Filled 2022-08-02: qty 10

## 2022-08-02 MED ORDER — SODIUM CHLORIDE 0.9% FLUSH: 3.0000 mL | INTRAVENOUS | Status: DC | PRN

## 2022-08-02 MED ORDER — MIDAZOLAM HCL 2 MG/2ML IJ SOLN
INTRAMUSCULAR | Status: AC
  Filled 2022-08-02: qty 2

## 2022-08-02 MED ORDER — HEPARIN SODIUM (PORCINE) 1000 UNIT/ML IJ SOLN
INTRAMUSCULAR | Status: DC | PRN
  Administered 2022-08-02: 4000 [IU] via INTRAVENOUS

## 2022-08-02 MED ORDER — HEPARIN (PORCINE) IN NACL 1000-0.9 UT/500ML-% IV SOLN
INTRAVENOUS | Status: DC | PRN
  Administered 2022-08-02 (×2): 500 mL

## 2022-08-02 MED ORDER — MIDAZOLAM HCL 2 MG/2ML IJ SOLN
INTRAMUSCULAR | Status: DC | PRN
  Administered 2022-08-02 (×2): 1 mg via INTRAVENOUS

## 2022-08-02 MED ORDER — VERAPAMIL HCL 2.5 MG/ML IV SOLN
INTRAVENOUS | Status: DC | PRN
  Administered 2022-08-02: 10 mL via INTRA_ARTERIAL

## 2022-08-02 MED ORDER — HEPARIN (PORCINE) IN NACL 1000-0.9 UT/500ML-% IV SOLN
INTRAVENOUS | Status: AC
  Filled 2022-08-02: qty 1000

## 2022-08-02 MED ORDER — IOHEXOL 350 MG/ML SOLN
INTRAVENOUS | Status: DC | PRN
  Administered 2022-08-02: 80 mL

## 2022-08-02 MED ORDER — SODIUM CHLORIDE 0.9 % IV SOLN: INTRAVENOUS | Status: DC

## 2022-08-02 MED ORDER — LIDOCAINE HCL (PF) 1 % IJ SOLN
INTRAMUSCULAR | Status: DC | PRN
  Administered 2022-08-02 (×2): 2 mL

## 2022-08-02 MED ORDER — LABETALOL HCL 5 MG/ML IV SOLN: 10.0000 mg | INTRAVENOUS | Status: DC | PRN

## 2022-08-02 MED ORDER — OXYCODONE HCL 5 MG PO TABS: 5.0000 mg | ORAL_TABLET | ORAL | Status: DC | PRN

## 2022-08-02 MED ORDER — ASPIRIN 81 MG PO CHEW: 81.0000 mg | CHEWABLE_TABLET | ORAL | Status: DC

## 2022-08-02 MED ORDER — SODIUM CHLORIDE 0.9 % IV SOLN: 250.0000 mL | INTRAVENOUS | Status: DC | PRN

## 2022-08-02 MED ORDER — VERAPAMIL HCL 2.5 MG/ML IV SOLN
INTRAVENOUS | Status: AC
  Filled 2022-08-02: qty 2

## 2022-08-02 MED ORDER — FENTANYL CITRATE (PF) 100 MCG/2ML IJ SOLN
INTRAMUSCULAR | Status: DC | PRN
  Administered 2022-08-02: 50 ug via INTRAVENOUS

## 2022-08-02 MED ORDER — ACETAMINOPHEN 325 MG PO TABS: 650.0000 mg | ORAL_TABLET | ORAL | Status: DC | PRN

## 2022-08-02 MED ORDER — ONDANSETRON HCL 4 MG/2ML IJ SOLN: 4.0000 mg | Freq: Four times a day (QID) | INTRAMUSCULAR | Status: DC | PRN

## 2022-08-02 MED ORDER — LIDOCAINE HCL (PF) 1 % IJ SOLN
INTRAMUSCULAR | Status: AC
  Filled 2022-08-02: qty 30

## 2022-08-02 SURGICAL SUPPLY — 12 items
CATH 5FR JL3.5 JR4 ANG PIG MP (CATHETERS) IMPLANT
CATH SWAN GANZ 7F STRAIGHT (CATHETERS) IMPLANT
DEVICE RAD COMP TR BAND LRG (VASCULAR PRODUCTS) IMPLANT
GLIDESHEATH SLEND A-KIT 6F 22G (SHEATH) IMPLANT
GLIDESHEATH SLENDER 7FR .021G (SHEATH) IMPLANT
GUIDEWIRE .025 260CM (WIRE) IMPLANT
GUIDEWIRE INQWIRE 1.5J.035X260 (WIRE) IMPLANT
INQWIRE 1.5J .035X260CM (WIRE) ×1
KIT HEART LEFT (KITS) ×1 IMPLANT
PACK CARDIAC CATHETERIZATION (CUSTOM PROCEDURE TRAY) ×1 IMPLANT
TRANSDUCER W/STOPCOCK (MISCELLANEOUS) ×1 IMPLANT
TUBING CIL FLEX 10 FLL-RA (TUBING) ×1 IMPLANT

## 2022-08-02 NOTE — CV Procedure (Signed)
Normal right heart pressures Normal LVEDP No significant change in anatomy with moderate to moderately severe LAD and mild to moderate RCA and distal circumflex disease. Plan continue guideline directed therapy for systolic heart failure.  Aggressive risk factor modification.

## 2022-08-02 NOTE — Interval H&P Note (Signed)
Cath Lab Visit (complete for each Cath Lab visit)  Clinical Evaluation Leading to the Procedure:   ACS: No.  Non-ACS:    Anginal Classification: CCS III  Anti-ischemic medical therapy: Minimal Therapy (1 class of medications)  Non-Invasive Test Results: No non-invasive testing performed  Prior CABG: No previous CABG      History and Physical Interval Note:  08/02/2022 10:45 AM  Omar Sandoval  has presented today for surgery, with the diagnosis of cad.  The various methods of treatment have been discussed with the patient and family. After consideration of risks, benefits and other options for treatment, the patient has consented to  Procedure(s): RIGHT/LEFT HEART CATH AND CORONARY ANGIOGRAPHY (N/A) as a surgical intervention.  The patient's history has been reviewed, patient examined, no change in status, stable for surgery.  I have reviewed the patient's chart and labs.  Questions were answered to the patient's satisfaction.     [name] III

## 2022-08-02 NOTE — Progress Notes (Signed)
TR BAND REMOVAL  LOCATION:    right radial  DEFLATED PER PROTOCOL:    Yes.    TIME BAND OFF / DRESSING APPLIED:    1350 gauze dressing applied   SITE UPON ARRIVAL:    Level 0  SITE AFTER BAND REMOVAL:    Level 0  CIRCULATION SENSATION AND MOVEMENT:    Within Normal Limits   Yes.    COMMENTS:   no issues noted

## 2022-08-02 NOTE — Discharge Instructions (Signed)

## 2022-08-05 ENCOUNTER — Encounter (HOSPITAL_COMMUNITY): Payer: Self-pay | Admitting: Interventional Cardiology

## 2022-08-06 ENCOUNTER — Ambulatory Visit (HOSPITAL_COMMUNITY)
Admission: EM | Admit: 2022-08-06 | Discharge: 2022-08-06 | Disposition: A | Discharge status: Home / Self Care | Payer: Medicare Other | Attending: Family Medicine | Admitting: Family Medicine

## 2022-08-06 ENCOUNTER — Encounter (HOSPITAL_COMMUNITY): Payer: Self-pay | Admitting: Emergency Medicine

## 2022-08-06 DIAGNOSIS — W540XXA Bitten by dog, initial encounter: Secondary | ICD-10-CM

## 2022-08-06 DIAGNOSIS — L03114 Cellulitis of left upper limb: Secondary | ICD-10-CM

## 2022-08-06 MED ORDER — AMOXICILLIN-POT CLAVULANATE 875-125 MG PO TABS: 1.0000 | ORAL_TABLET | Freq: Two times a day (BID) | ORAL | 0 refills | Status: DC

## 2022-08-06 NOTE — ED Provider Notes (Signed)
[place]    CSN: [id number] Arrival date & time: 08/06/22  0820      History   Chief Complaint Chief Complaint  Patient presents with   Animal Bite    HPI Omar Sandoval is a 66 y.o. male.   Patient is here for animal bite.  He was bit on her left wrist 4 days ago by another person's dog while walking.   He was told the dog was UTD with all his shots, including rabies.  He just went home, cleaned it, covered it, has been changing the dressing.  He is now noticing an odor.  It is still sore from the bite but not too bad.        Past Medical History:  Diagnosis Date   HIV (human immunodeficiency virus infection) ([place])    Immune deficiency disorder ([place])    Inguinal hernia    Left   Myocardial infarction [facility])    01-2017 angioplasty with stents    Patient Active Problem List   Diagnosis Date Noted   Essential hypertension 02/08/2019   OSA on CPAP 02/08/2019   Cardiomyopathy, ischemic 02/08/2019   Left inguinal hernia 07/14/2017   Chronic combined systolic and diastolic heart failure ([place]) 02/28/2017   Hyperlipidemia 02/06/2017   Coronary artery disease involving native coronary artery of native heart with angina pectoris ([place]) 02/06/2017   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 02/2017 02/03/2017   HIV disease ([place]) 01/06/2013   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    Past Surgical History:  Procedure Laterality Date   COLONOSCOPY     CORONARY ANGIOPLASTY     CORONARY/GRAFT ACUTE MI REVASCULARIZATION N/A 02/03/2017   Procedure: Coronary/Graft Acute MI Revascularization;  Surgeon: Belva Crome, MD;  Location: [place] [facility];  Service: Cardiovascular;  Laterality: N/A;   FRACTURE SURGERY     left ankle and right foot   HERNIA REPAIR     INGUINAL HERNIA REPAIR Left 08/27/2017   Procedure: LAPAROSCOPIC LEFT INGUINAL HERNIA REPAIR WITH MESH;  Surgeon: Ralene Ok, MD;  Location: [place];  Service: General;  Laterality: Left;   INSERTION OF MESH Left 08/27/2017    Procedure: INSERTION OF MESH;  Surgeon: Ralene Ok, MD;  Location: [place];  Service: General;  Laterality: Left;   LEFT HEART CATH AND CORONARY ANGIOGRAPHY N/A 02/03/2017   Procedure: Left Heart Cath and Coronary Angiography;  Surgeon: Belva Crome, MD;  Location: [place] [facility];  Service: Cardiovascular;  Laterality: N/A;   LEFT HEART CATH AND CORONARY ANGIOGRAPHY N/A 09/25/2018   Procedure: LEFT HEART CATH AND CORONARY ANGIOGRAPHY;  Surgeon: Belva Crome, MD;  Location: [place] [facility];  Service: Cardiovascular;  Laterality: N/A;   OPEN REDUCTION INTERNAL FIXATION (ORIF) DISTAL RADIAL FRACTURE Right 07/20/2016   Procedure: OPEN REDUCTION INTERNAL FIXATION (ORIF) DISTAL RADIAL FRACTURE;  Surgeon: Iran Planas, MD;  Location: [place];  Service: Orthopedics;  Laterality: Right;   RIGHT/LEFT HEART CATH AND CORONARY ANGIOGRAPHY N/A 08/02/2022   Procedure: RIGHT/LEFT HEART CATH AND CORONARY ANGIOGRAPHY;  Surgeon: Belva Crome, MD;  Location: [place] [facility];  Service: Cardiovascular;  Laterality: N/A;   SURGERY SCROTAL / TESTICULAR     nondecended testes       Home Medications    Prior to Admission medications   Medication Sig Start Date End Date Taking? Authorizing Provider  atorvastatin (LIPITOR) 40 MG tablet TAKE 1 TABLET BY MOUTH ONCE DAILY AT 6PM . 06/18/22   Belva Crome,  MD  clopidogrel (PLAVIX) 75 MG tablet TAKE 1 TABLET BY MOUTH ONCE DAILY . APPOINTMENT REQUIRED FOR FUTURE REFILLS 05/13/22   Belva Crome, MD  Dolutegravir-lamiVUDine 50-300 MG TABS Take 1 tablet by mouth daily.  06/03/18   [provider]  empagliflozin (JARDIANCE) 10 MG TABS tablet Take 1 tablet (10 mg total) by mouth daily before breakfast. 06/18/22   Belva Crome, MD  metoprolol succinate (TOPROL-XL) 25 MG 24 hr tablet Take 1/2 (one-half) tablet by mouth twice daily 05/13/22   Belva Crome, MD  nitroGLYCERIN (NITROSTAT) 0.4 MG SL tablet Place 1 tablet (0.4 mg total) under the tongue every  5 (five) minutes x 3 doses as needed for chest pain. 03/19/18   Belva Crome, MD  oxyCODONE-acetaminophen (PERCOCET) 10-325 MG tablet Take 0.5-1 tablets by mouth every 4 (four) hours as needed for pain.    [provider]  sacubitril-valsartan (ENTRESTO) 24-26 MG Take 1 tablet by mouth 2 (two) times daily. 05/03/22   Elgie Collard, PA-C    Family History Family History  Problem Relation Age of Onset   Cancer Mother        lung   Cancer Brother        lung, liver, and colon    Social History Social History   Tobacco Use   Smoking status: Former    Packs/day: 0.50    Years: 45.00    Total pack years: 22.50    Types: Cigarettes    Quit date: 2013    Years since quitting: 10.9   Smokeless tobacco: Never  Vaping Use   Vaping Use: Never used  Substance Use Topics   Alcohol use: Yes    Alcohol/week: 3.0 standard drinks of alcohol    Types: 3 Cans of beer per week    Comment: beer daily   Drug use: Yes    Types: Marijuana    Comment: used  Marijuana     Allergies   Chocolate, Lactose intolerance (gi), Milk (cow), and Vicodin [hydrocodone-acetaminophen]   Review of Systems Review of Systems  Constitutional: Negative.   HENT: Negative.    Respiratory: Negative.    Cardiovascular: Negative.   Gastrointestinal: Negative.   Skin:  Positive for wound.     Physical Exam Triage Vital Signs ED Triage Vitals  Enc Vitals Group     BP 08/06/22 0955 (!) 129/97     Pulse Rate 08/06/22 0955 92     Resp 08/06/22 0955 18     Temp 08/06/22 0955 99.1 F (37.3 C)     Temp Source 08/06/22 0955 Oral     SpO2 08/06/22 0955 100 %     Weight --      Height --      Head Circumference --      Peak Flow --      Pain Score 08/06/22 0954 0     Pain Loc --      Pain Edu? --      Excl. in [place]? --    No data found.  Updated Vital Signs BP (!) 129/97 (BP Location: Right Arm)   Pulse 92   Temp 99.1 F (37.3 C) (Oral)   Resp 18   SpO2 100%   Visual Acuity Right Eye  Distance:   Left Eye Distance:   Bilateral Distance:    Right Eye Near:   Left Eye Near:    Bilateral Near:     Physical Exam Constitutional:  Appearance: Normal appearance.  Skin:    General: Skin is warm.     Comments: 3 larger puncture wounds to the left anterior wrist;  2 are scabbed, 1 is still open without drainage.  3 smaller wounds scattered;  there is slight redness/erythema to the wrist;  no TTP noted;  full rom to the wrist without pain or limitation.   Neurological:     Mental Status: He is alert.      UC Treatments / Results  Labs (all labs ordered are listed, but only abnormal results are displayed) Labs Reviewed - No data to display  EKG   Radiology No results found.  Procedures Procedures (including critical care time)  Medications Ordered in UC Medications - No data to display  Initial Impression / Assessment and Plan / UC Course  I have reviewed the triage vital signs and the nursing notes.  Pertinent labs & imaging results that were available during my care of the patient were reviewed by me and considered in my medical decision making (see chart for details).   Final Clinical Impressions(s) / UC Diagnoses   Final diagnoses:  Dog bite, initial encounter  Cellulitis of left upper extremity     Discharge Instructions      You were seen today for infection after a dog bite.  I have sent out an antibiotic to your pharmacy.  Please take twice/day.  Keep the area covered while it is still open.   If the infection is not improving or worsening then please return or go to the ER for further evaluation.     ED Prescriptions     Medication Sig Dispense Auth. Provider   amoxicillin-clavulanate (AUGMENTIN) 875-125 MG tablet Take 1 tablet by mouth every 12 (twelve) hours. 20 tablet Jannifer Franklin, MD      PDMP not reviewed this encounter.   Jannifer Franklin, MD 08/06/22 1022

## 2022-08-06 NOTE — ED Triage Notes (Signed)
Pt bit on left wrist 4 days ago bu a dog. Reports shots were up to date. Pt reports area starting to smell and has drainage as to why he came in to be see.

## 2022-08-06 NOTE — Discharge Instructions (Signed)
You were seen today for infection after a dog bite.  I have sent out an antibiotic to your pharmacy.  Please take twice/day.  Keep the area covered while it is still open.   If the infection is not improving or worsening then please return or go to the ER for further evaluation.

## 2022-09-03 ENCOUNTER — Telehealth: Payer: Self-pay | Admitting: Interventional Cardiology

## 2022-09-03 NOTE — Telephone Encounter (Signed)
Pt calling because she is unsure when she should be f/u, I looked at last OV notes but it did not specify 6 mon or 1 yr.

## 2022-09-03 NOTE — Telephone Encounter (Signed)
Returned call to patient and scheduled appt with Dr. Gasper Sells on 09/06/2022 to f/u after heart cath and establish with provider to continue cardiology follow-up upon Dr. Thompson Caul retirement.

## 2022-09-06 ENCOUNTER — Ambulatory Visit: Payer: Medicare Other | Admitting: Internal Medicine

## 2022-09-17 ENCOUNTER — Ambulatory Visit (HOSPITAL_COMMUNITY)
Admission: RE | Admit: 2022-09-17 | Discharge: 2022-09-17 | Disposition: A | Discharge status: Home / Self Care | Payer: Medicare HMO | Source: Ambulatory Visit | Attending: Emergency Medicine | Admitting: Emergency Medicine

## 2022-09-17 ENCOUNTER — Ambulatory Visit (HOSPITAL_COMMUNITY): Payer: Medicare HMO

## 2022-09-17 VITALS — BP 112/79 | HR 75 | Temp 98.8°F | Resp 18

## 2022-09-17 DIAGNOSIS — A630 Anogenital (venereal) warts: Secondary | ICD-10-CM | POA: Diagnosis present

## 2022-09-17 MED ORDER — IMIQUIMOD 5 % EX CREA: TOPICAL_CREAM | CUTANEOUS | 0 refills | Status: DC

## 2022-09-17 NOTE — Discharge Instructions (Addendum)
Please use prescribed cream 3 times a week at bedtime.   Please schedule follow-up with your infectious disease doctor if symptoms are not improving.

## 2022-09-17 NOTE — ED Provider Notes (Signed)
Omar Sandoval    CSN: [id number] Arrival date & time: 09/17/22  1655      History   Chief Complaint Chief Complaint  Patient presents with   Rash    HPI Omar Sandoval is a 67 y.o. male.  Patient presents due to rash on the tip of penis that started 2 weeks ago.  He denies any trauma or injury to the penis. Patient reports that the area started out on painful but has recently become " itchy and irritated".  Patient denies any previous history of similar symptoms.  Patient denies any previous history of HSV.  Patient denies any recent sexual activity, patient states " I have not had any sexual intercourse in the past 10 years".  He denies any dysuria, penile discharge, testicular pain, fever, chills, abdominal pain, nausea, vomiting, rectal pain, or any systemic symptoms. Patient has a history of HIV, he reports that he adheres to the antiviral treatment regiment, he states that he does follow-up with infectious disease.    Rash   Past Medical History:  Diagnosis Date   HIV (human immunodeficiency virus infection) ([place])    Immune deficiency disorder ([place])    Inguinal hernia    Left   Myocardial infarction [facility])    01-2017 angioplasty with stents    Patient Active Problem List   Diagnosis Date Noted   Essential hypertension 02/08/2019   OSA on CPAP 02/08/2019   Cardiomyopathy, ischemic 02/08/2019   Left inguinal hernia 07/14/2017   Chronic combined systolic and diastolic heart failure ([place]) 02/28/2017   Hyperlipidemia 02/06/2017   Coronary artery disease involving native coronary artery of native heart with angina pectoris ([place]) 02/06/2017   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 02/2017 02/03/2017   HIV disease ([place]) 01/06/2013   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    Past Surgical History:  Procedure Laterality Date   COLONOSCOPY     CORONARY ANGIOPLASTY     CORONARY/GRAFT ACUTE MI REVASCULARIZATION N/A 02/03/2017   Procedure: Coronary/Graft Acute MI Revascularization;  Surgeon:  Belva Crome, MD;  Location: [place] [facility];  Service: Cardiovascular;  Laterality: N/A;   FRACTURE SURGERY     left ankle and right foot   HERNIA REPAIR     INGUINAL HERNIA REPAIR Left 08/27/2017   Procedure: LAPAROSCOPIC LEFT INGUINAL HERNIA REPAIR WITH MESH;  Surgeon: Ralene Ok, MD;  Location: [place];  Service: General;  Laterality: Left;   INSERTION OF MESH Left 08/27/2017   Procedure: INSERTION OF MESH;  Surgeon: Ralene Ok, MD;  Location: [place];  Service: General;  Laterality: Left;   LEFT HEART CATH AND CORONARY ANGIOGRAPHY N/A 02/03/2017   Procedure: Left Heart Cath and Coronary Angiography;  Surgeon: Belva Crome, MD;  Location: [place] [facility];  Service: Cardiovascular;  Laterality: N/A;   LEFT HEART CATH AND CORONARY ANGIOGRAPHY N/A 09/25/2018   Procedure: LEFT HEART CATH AND CORONARY ANGIOGRAPHY;  Surgeon: Belva Crome, MD;  Location: [place] [facility];  Service: Cardiovascular;  Laterality: N/A;   OPEN REDUCTION INTERNAL FIXATION (ORIF) DISTAL RADIAL FRACTURE Right 07/20/2016   Procedure: OPEN REDUCTION INTERNAL FIXATION (ORIF) DISTAL RADIAL FRACTURE;  Surgeon: Iran Planas, MD;  Location: [place];  Service: Orthopedics;  Laterality: Right;   RIGHT/LEFT HEART CATH AND CORONARY ANGIOGRAPHY N/A 08/02/2022   Procedure: RIGHT/LEFT HEART CATH AND CORONARY ANGIOGRAPHY;  Surgeon: Belva Crome, MD;  Location: [place] [facility];  Service: Cardiovascular;  Laterality: N/A;   SURGERY SCROTAL / TESTICULAR  nondecended testes       Home Medications    Prior to Admission medications   Medication Sig Start Date End Date Taking? Authorizing Provider  imiquimod (ALDARA) 5 % cream Use 3 times a week at bedtime. 09/18/22  Yes Debby Freiberg, NP  atorvastatin (LIPITOR) 40 MG tablet TAKE 1 TABLET BY MOUTH ONCE DAILY AT 6PM . 06/18/22   [name], MD  clopidogrel (PLAVIX) 75 MG tablet TAKE 1 TABLET BY MOUTH ONCE DAILY . APPOINTMENT REQUIRED FOR FUTURE REFILLS  05/13/22   [name], MD  Dolutegravir-lamiVUDine 50-300 MG TABS Take 1 tablet by mouth daily.  06/03/18   [provider]  empagliflozin (JARDIANCE) 10 MG TABS tablet Take 1 tablet (10 mg total) by mouth daily before breakfast. 06/18/22   [name], MD  metoprolol succinate (TOPROL-XL) 25 MG 24 hr tablet Take 1/2 (one-half) tablet by mouth twice daily 05/13/22   [name], MD  nitroGLYCERIN (NITROSTAT) 0.4 MG SL tablet Place 1 tablet (0.4 mg total) under the tongue every 5 (five) minutes x 3 doses as needed for chest pain. 03/19/18   [name], MD  oxyCODONE-acetaminophen (PERCOCET) 10-325 MG tablet Take 0.5-1 tablets by mouth every 4 (four) hours as needed for pain.    [provider]  sacubitril-valsartan (ENTRESTO) 24-26 MG Take 1 tablet by mouth 2 (two) times daily. 05/03/22   Sharlene Dory, PA-C    Family History Family History  Problem Relation Age of Onset   Cancer Mother        lung   Cancer Brother        lung, liver, and colon    Social History Social History   Tobacco Use   Smoking status: Former    Packs/day: 0.50    Years: 45.00    Total pack years: 22.50    Types: Cigarettes    Quit date: 2013    Years since quitting: 11.0   Smokeless tobacco: Never  Vaping Use   Vaping Use: Never used  Substance Use Topics   Alcohol use: Yes    Alcohol/week: 3.0 standard drinks of alcohol    Types: 3 Cans of beer per week    Comment: beer daily   Drug use: Yes    Types: Marijuana    Comment: used  Marijuana     Allergies   Chocolate, Lactose intolerance (gi), Milk (cow), and Vicodin [hydrocodone-acetaminophen]   Review of Systems Review of Systems  Skin:  Positive for rash.   Per HPI  Physical Exam Triage Vital Signs ED Triage Vitals [09/17/22 1736]  Enc Vitals Group     BP 112/79     Pulse Rate 75     Resp 18     Temp 98.8 F (37.1 C)     Temp Source Oral     SpO2 98 %     Weight      Height      Head Circumference       Peak Flow      Pain Score 0     Pain Loc      Pain Edu?      Excl. in [place]?    No data found.  Updated Vital Signs BP 112/79 (BP Location: Left Arm)   Pulse 75   Temp 98.8 F (37.1 C) (Oral)   Resp 18   SpO2 98%   Visual Acuity Right Eye Distance:   Left Eye Distance:   Bilateral Distance:  Right Eye Near:   Left Eye Near:    Bilateral Near:     Physical Exam Vitals and nursing note reviewed.  Constitutional:      Appearance: Normal appearance.  Genitourinary:    Pubic Area: No rash or pubic lice.      Penis: Lesions present. No erythema, tenderness, discharge or swelling.      Testes: Normal.        Right: Mass, tenderness or swelling not present. Right testis is descended. Cremasteric reflex is present.         Left: Mass, tenderness or swelling not present. Left testis is descended. Cremasteric reflex is present.      Epididymis:     Right: Normal. Not inflamed or enlarged. No mass or tenderness.     Left: Normal. Not inflamed or enlarged. No mass or tenderness.       Comments: 2 flesh-colored lesions, approximately 1 cm in diameter, present at the tip of penis, rubbery texture, no pain upon palpation.  Lymphadenopathy:     Lower Body: No right inguinal adenopathy. No left inguinal adenopathy.  Neurological:     Mental Status: He is alert.      UC Treatments / Results  Labs (all labs ordered are listed, but only abnormal results are displayed) Labs Reviewed  HSV CULTURE AND TYPING    EKG   Radiology No results found.  Procedures Procedures (including critical care time)  Medications Ordered in UC Medications - No data to display  Initial Impression / Assessment and Plan / UC Course  I have reviewed the triage vital signs and the nursing notes.  Pertinent labs & imaging results that were available during my care of the patient were reviewed by me and considered in my medical decision making (see chart for details).     Patient was  evaluated for genital warts.  HSV culture pending.  Low suspicion for HSV.  Based on physical assessment, high suspicion for genital warts.  Aldara cream was prescribed and sent to the pharmacy, patient was made aware of prescribed regiment.  Patient was made aware to follow-up with infectious disease if his symptoms are not improving with prescribed treatment.  Patient verbalized understanding of instructions.   Charting was provided using a a verbal dictation system, charting was proofread for errors, errors may occur which could change the meaning of the information charted.   Final Clinical Impressions(s) / UC Diagnoses   Final diagnoses:  Genital warts     Discharge Instructions      Please use prescribed cream 3 times a week at bedtime.   Please schedule follow-up with your infectious disease doctor if symptoms are not improving.    ED Prescriptions     Medication Sig Dispense Auth. Provider   imiquimod (ALDARA) 5 % cream Use 3 times a week at bedtime. 12 each Flossie Dibble, NP      PDMP not reviewed this encounter.

## 2022-09-17 NOTE — ED Triage Notes (Signed)
Pt c/o rash to tip of penis for over 2 wks. States now its itchy and irritated. States he is not sexually active. States taking percocet's for pain

## 2022-09-20 LAB — HSV CULTURE AND TYPING

## 2022-11-02 ENCOUNTER — Encounter (HOSPITAL_COMMUNITY): Payer: Self-pay

## 2022-11-02 ENCOUNTER — Emergency Department (HOSPITAL_COMMUNITY): Payer: Medicare HMO

## 2022-11-02 ENCOUNTER — Emergency Department (HOSPITAL_COMMUNITY)
Admission: EM | Admit: 2022-11-02 | Discharge: 2022-11-02 | Disposition: A | Discharge status: Home / Self Care | Payer: Medicare HMO | Attending: Emergency Medicine | Admitting: Emergency Medicine

## 2022-11-02 DIAGNOSIS — Z7902 Long term (current) use of antithrombotics/antiplatelets: Secondary | ICD-10-CM | POA: Insufficient documentation

## 2022-11-02 DIAGNOSIS — I728 Aneurysm of other specified arteries: Secondary | ICD-10-CM | POA: Insufficient documentation

## 2022-11-02 DIAGNOSIS — R109 Unspecified abdominal pain: Secondary | ICD-10-CM

## 2022-11-02 LAB — URINALYSIS, ROUTINE W REFLEX MICROSCOPIC
Bilirubin Urine: NEGATIVE
Glucose, UA: NEGATIVE mg/dL
Ketones, ur: NEGATIVE mg/dL
Leukocytes,Ua: NEGATIVE
Nitrite: NEGATIVE
Protein, ur: NEGATIVE mg/dL
Specific Gravity, Urine: 1.003 — ABNORMAL LOW (ref 1.005–1.030)
pH: 5 (ref 5.0–8.0)

## 2022-11-02 LAB — BASIC METABOLIC PANEL
Anion gap: 12 (ref 5–15)
BUN: 7 mg/dL — ABNORMAL LOW (ref 8–23)
CO2: 24 mmol/L (ref 22–32)
Calcium: 9.2 mg/dL (ref 8.9–10.3)
Chloride: 99 mmol/L (ref 98–111)
Creatinine, Ser: 0.74 mg/dL (ref 0.61–1.24)
GFR, Estimated: >60 mL/min (ref >60)
Glucose, Bld: 96 mg/dL (ref 70–99)
Potassium: 3.6 mmol/L (ref 3.5–5.1)
Sodium: 135 mmol/L (ref 135–145)

## 2022-11-02 LAB — CBC
HCT: 39.9 % (ref 39.0–52.0)
Hemoglobin: 13.8 g/dL (ref 13.0–17.0)
MCH: 34.7 pg — ABNORMAL HIGH (ref 26.0–34.0)
MCHC: 34.6 g/dL (ref 30.0–36.0)
MCV: 100.3 fL — ABNORMAL HIGH (ref 80.0–100.0)
Platelets: 336 10*3/uL (ref 150–400)
RBC: 3.98 MIL/uL — ABNORMAL LOW (ref 4.22–5.81)
RDW: 12.9 % (ref 11.5–15.5)
WBC: 4 10*3/uL (ref 4.0–10.5)
nRBC: 0 % (ref 0.0–0.2)

## 2022-11-02 MED ORDER — LIDOCAINE 5 % EX PTCH
1.0000 | MEDICATED_PATCH | CUTANEOUS | Status: DC
  Administered 2022-11-02: 1 via TRANSDERMAL
  Filled 2022-11-02: qty 1

## 2022-11-02 MED ORDER — NAPROXEN 250 MG PO TABS: 500.0000 mg | ORAL_TABLET | Freq: Once | ORAL | Status: DC

## 2022-11-02 NOTE — ED Triage Notes (Signed)
Pt to ED c/o left flank pain x 2 days. Denies urinary symptoms.

## 2022-11-02 NOTE — Discharge Instructions (Addendum)
Today in the emergency department due to side pain.  Your workup today was overall reassuring, no signs of kidney stone, urinary tract infection, kidney injury or acute emergent process.  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  Please call schedule follow-up in the next week.  I think your pain today is secondary to muscle strain, you can take Tylenol as needed for pain, return to the ED for new or concerning symptoms.

## 2022-11-02 NOTE — ED Provider Notes (Signed)
[facility] Provider Note   CSN: [id number] Arrival date & time: 11/02/22  1231     History  Chief Complaint  Patient presents with   Flank Pain         Omar Sandoval is a 67 y.o. male.   Flank Pain     This is a very pleasant 79 male presented to the emergency room due to left flank pain.  Symptoms started 2 days ago, they are intermittent and worse with movement.  He denies any dysuria or hematuria, no history of kidney stones.  Denies any testicular pain or discharge.  Not having any nausea, vomiting, chest pain or shortness of breath.  Home Medications Prior to Admission medications   Medication Sig Start Date End Date Taking? Authorizing Provider  atorvastatin (LIPITOR) 40 MG tablet TAKE 1 TABLET BY MOUTH ONCE DAILY AT 6PM . 06/18/22   Belva Crome, MD  clopidogrel (PLAVIX) 75 MG tablet TAKE 1 TABLET BY MOUTH ONCE DAILY . APPOINTMENT REQUIRED FOR FUTURE REFILLS 05/13/22   Belva Crome, MD  Dolutegravir-lamiVUDine 50-300 MG TABS Take 1 tablet by mouth daily.  06/03/18   [provider]  empagliflozin (JARDIANCE) 10 MG TABS tablet Take 1 tablet (10 mg total) by mouth daily before breakfast. 06/18/22   Belva Crome, MD  imiquimod Leroy Sea) 5 % cream Use 3 times a week at bedtime. 09/18/22   Flossie Dibble, NP  metoprolol succinate (TOPROL-XL) 25 MG 24 hr tablet Take 1/2 (one-half) tablet by mouth twice daily 05/13/22   Belva Crome, MD  nitroGLYCERIN (NITROSTAT) 0.4 MG SL tablet Place 1 tablet (0.4 mg total) under the tongue every 5 (five) minutes x 3 doses as needed for chest pain. 03/19/18   Belva Crome, MD  oxyCODONE-acetaminophen (PERCOCET) 10-325 MG tablet Take 0.5-1 tablets by mouth every 4 (four) hours as needed for pain.    [provider]  sacubitril-valsartan (ENTRESTO) 24-26 MG Take 1 tablet by mouth 2 (two) times daily. 05/03/22   Elgie Collard, PA-C      Allergies    Chocolate, Lactose  intolerance (gi), Milk (cow), and Vicodin [hydrocodone-acetaminophen]    Review of Systems   Review of Systems  Genitourinary:  Positive for flank pain.    Physical Exam Updated Vital Signs BP 96/68 (BP Location: Right Arm)   Pulse 75   Temp 98 F (36.7 C)   Resp 16   Ht '5\' 6"'$  (1.676 m)   Wt 77.1 kg   SpO2 100%   BMI 27.44 kg/m  Physical Exam Vitals and nursing note reviewed. Exam conducted with a chaperone present.  Constitutional:      Appearance: Normal appearance.  HENT:     Head: Normocephalic and atraumatic.  Eyes:     General: No scleral icterus.       Right eye: No discharge.        Left eye: No discharge.     Extraocular Movements: Extraocular movements intact.     Pupils: Pupils are equal, round, and reactive to light.  Cardiovascular:     Rate and Rhythm: Normal rate and regular rhythm.     Pulses: Normal pulses.     Heart sounds: Normal heart sounds.     No friction rub. No gallop.     Comments: Upper and lower extremity pulses are symmetric bilaterally Pulmonary:     Effort: Pulmonary effort is normal. No respiratory distress.  Breath sounds: Normal breath sounds.  Abdominal:     General: Abdomen is flat. Bowel sounds are normal. There is no distension.     Palpations: Abdomen is soft.     Tenderness: There is no abdominal tenderness. There is left CVA tenderness.  Skin:    General: Skin is warm and dry.     Coloration: Skin is not jaundiced.     Comments: No shingles or vesicular lesions  Neurological:     Mental Status: He is alert. Mental status is at baseline.     Coordination: Coordination normal.     ED Results / Procedures / Treatments   Labs (all labs ordered are listed, but only abnormal results are displayed) Labs Reviewed  URINALYSIS, ROUTINE W REFLEX MICROSCOPIC - Abnormal; Notable for the following components:      Result Value   Color, Urine STRAW (*)    Specific Gravity, Urine 1.003 (*)    Hgb urine dipstick SMALL (*)     Bacteria, UA RARE (*)    All other components within normal limits  BASIC METABOLIC PANEL - Abnormal; Notable for the following components:   BUN 7 (*)    All other components within normal limits  CBC - Abnormal; Notable for the following components:   RBC 3.98 (*)    MCV 100.3 (*)    MCH 34.7 (*)    All other components within normal limits    EKG None  Radiology CT Renal Stone Study  Result Date: 11/02/2022 CLINICAL DATA:  Two day history of left flank pain EXAM: CT ABDOMEN AND PELVIS WITHOUT CONTRAST TECHNIQUE: Multidetector CT imaging of the abdomen and pelvis was performed following the standard protocol without IV contrast. RADIATION DOSE REDUCTION: This exam was performed according to the departmental dose-optimization program which includes automated exposure control, adjustment of the mA and/or kV according to patient size and/or use of iterative reconstruction technique. COMPARISON:  Remote prior CT scan of the abdomen and pelvis 03/28/2009 FINDINGS: Lower chest: No acute abnormality. Hepatobiliary: Normal hepatic contour and morphology. No discrete hepatic lesion. Gallbladder is unremarkable. No intra or extrahepatic biliary ductal dilatation. Pancreas: Unremarkable. No pancreatic ductal dilatation or surrounding inflammatory changes. Spleen: Normal in size without focal abnormality. Adrenals/Urinary Tract: Adrenal glands are unremarkable. Kidneys are normal, without renal calculi, focal lesion, or hydronephrosis. Bladder is unremarkable. Stomach/Bowel: No evidence of obstruction or focal bowel wall thickening. Normal appendix in the right lower quadrant. The terminal ileum is unremarkable. Vascular/Lymphatic: Limited evaluation in the absence of intravenous contrast. 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 The aneurysm is relatively long in length. This is new compared to prior imaging from 20/10. Evaluation is limited in the absence of  intravenous contrast. Scattered plaque throughout the abdominal aorta and branch vessels. No suspicious lymphadenopathy. Reproductive: Prostate is unremarkable. Other: Omental fat containing umbilical hernia. Musculoskeletal: Chronic T12 compression fracture with approximately 50% height loss. No acute fracture or malalignment. Bilateral lower lumbar facet arthropathy. IMPRESSION: 1. No evidence of nephrolithiasis or ureteral stones. 2. 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 Evaluation is limited in the absence of intravenous contrast. Consider further evaluation with CT arteriogram of the abdomen and pelvis. 3. Chronic T12 compression fracture with approximately 50% height loss. Electronically Signed   By: Jacqulynn Cadet M.D.   On: 11/02/2022 14:12    Procedures Procedures    Medications Ordered in ED Medications -  No data to display   ED Course/ Medical Decision Making/ A&P Clinical Course as of 11/02/22 2015  Sat Nov 02, 2022  1344 CBC(!) No leukocytosis or anemia [HS]  [id number] Basic metabolic panel(!) No gross electrolyte derangement or AKI [HS]  1430 Urinalysis, Routine w reflex microscopic -Urine, Clean Catch(!) Trace hematuria but no leukocyturia [HS]    Clinical Course User Index [HS] Sherrill Raring, PA-C                             Medical Decision Making Amount and/or Complexity of Data Reviewed Labs: ordered. Decision-making details documented in ED Course. Radiology: ordered.  Risk Prescription drug management.   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  Symptoms started 2 days ago, differential includes nephrolithiasis, UTI, pyelonephritis, musculoskeletal.  I considered dissection but there is no chest pain, no tearing pain to the back and he does not have any risk factors for it.  Additionally there is no pulse deficit.  I ordered, viewed  interpreted laboratory workup as documented ED course.  Workup overall reassuring, no signs of nephrolithiasis.  The pain is most likely musculoskeletal.  Of note the CT renal study did note a 2.3 aneurysmal dilation of the hepatic artery, this is on the opposite side of where his pain is, is not recent producible and I do not dissection.  Discussed the results with the patient gave him a copy of the CT scan and had him verbalized understanding to follow-up with his primary regarding this routine management.  He is stable for outpatient follow-up at this time.        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

## 2022-11-11 ENCOUNTER — Emergency Department (HOSPITAL_COMMUNITY): Payer: Medicare HMO

## 2022-11-11 ENCOUNTER — Emergency Department (HOSPITAL_COMMUNITY)
Admission: EM | Admit: 2022-11-11 | Discharge: 2022-11-11 | Disposition: A | Discharge status: Home / Self Care | Payer: Medicare HMO | Attending: Student | Admitting: Student

## 2022-11-11 DIAGNOSIS — Z23 Encounter for immunization: Secondary | ICD-10-CM | POA: Insufficient documentation

## 2022-11-11 DIAGNOSIS — I10 Essential (primary) hypertension: Secondary | ICD-10-CM | POA: Diagnosis not present

## 2022-11-11 DIAGNOSIS — S81851A Open bite, right lower leg, initial encounter: Secondary | ICD-10-CM | POA: Diagnosis present

## 2022-11-11 DIAGNOSIS — W540XXA Bitten by dog, initial encounter: Secondary | ICD-10-CM | POA: Insufficient documentation

## 2022-11-11 DIAGNOSIS — I2510 Atherosclerotic heart disease of native coronary artery without angina pectoris: Secondary | ICD-10-CM | POA: Diagnosis not present

## 2022-11-11 DIAGNOSIS — Z7902 Long term (current) use of antithrombotics/antiplatelets: Secondary | ICD-10-CM | POA: Insufficient documentation

## 2022-11-11 DIAGNOSIS — S81811A Laceration without foreign body, right lower leg, initial encounter: Secondary | ICD-10-CM | POA: Diagnosis not present

## 2022-11-11 MED ORDER — AMOXICILLIN-POT CLAVULANATE 875-125 MG PO TABS: 1.0000 | ORAL_TABLET | Freq: Two times a day (BID) | ORAL | 0 refills | Status: DC

## 2022-11-11 MED ORDER — TETANUS-DIPHTH-ACELL PERTUSSIS 5-2.5-18.5 LF-MCG/0.5 IM SUSY
0.5000 mL | PREFILLED_SYRINGE | Freq: Once | INTRAMUSCULAR | Status: AC
  Administered 2022-11-11: 0.5 mL via INTRAMUSCULAR
  Filled 2022-11-11: qty 0.5

## 2022-11-11 MED ORDER — LIDOCAINE-EPINEPHRINE (PF) 2 %-1:200000 IJ SOLN
20.0000 mL | Freq: Once | INTRAMUSCULAR | Status: AC
  Administered 2022-11-11: 20 mL
  Filled 2022-11-11: qty 20

## 2022-11-11 MED ORDER — AMOXICILLIN-POT CLAVULANATE 875-125 MG PO TABS
1.0000 | ORAL_TABLET | Freq: Once | ORAL | Status: AC
  Administered 2022-11-11: 1 via ORAL
  Filled 2022-11-11: qty 1

## 2022-11-11 MED ORDER — OXYCODONE-ACETAMINOPHEN 5-325 MG PO TABS
2.0000 | ORAL_TABLET | Freq: Once | ORAL | Status: AC
  Administered 2022-11-11: 2 via ORAL
  Filled 2022-11-11: qty 2

## 2022-11-11 NOTE — ED Notes (Signed)
Wet to dry dressing placed with adaptic on patient. Wound care education provided.

## 2022-11-11 NOTE — Discharge Instructions (Addendum)
As we discussed, leave the dressing that you were given today in the emergency department on for the next 24 hours.  You were given a replacement dressing that you can put on tomorrow and leave on for another 24 hours.  Please keep the wound clean with soap and water.  You can shower but do not soak the leg in water because of the stitches.  Please have the wound evaluated and seen in 7 to 10 days to have the stitches removed.  If you have any redness, warmth, red streaks coming from the wound or puslike drainage coming from the wound please return to the emergency department immediately otherwise you can be seen at your primary care physician, urgent care or here for the stitches to be removed.  You have been given an antibiotic to take.

## 2022-11-11 NOTE — ED Provider Notes (Signed)
[facility] Provider Note   CSN: [id number] Arrival date & time: 11/11/22  1327     History  Chief Complaint  Patient presents with   Animal Bite    Omar Sandoval is a 67 y.o. male with PMH HIV on antiretrovirals, CAD, HLD, HTN presenting after being chased and bitten multiple times by a pit bull last night. Patient states the owner told him the dog was up to date on vaccinations. Patient did not notify police or animal control.  He does not recall his lat tetanus vaccine.   Patient was ambulatory after the incident, did not fall or have any head injury.  No other bites. Patient has full sensation in his leg, but it is painful.  Animal Bite      Home Medications Prior to Admission medications   Medication Sig Start Date End Date Taking? Authorizing Provider  amoxicillin-clavulanate (AUGMENTIN) 875-125 MG tablet Take 1 tablet by mouth every 12 (twelve) hours for 10 days. 11/11/22 11/21/22 Yes Bradd Canary, MD  atorvastatin (LIPITOR) 40 MG tablet TAKE 1 TABLET BY MOUTH ONCE DAILY AT 6PM . 06/18/22   Belva Crome, MD  clopidogrel (PLAVIX) 75 MG tablet TAKE 1 TABLET BY MOUTH ONCE DAILY . APPOINTMENT REQUIRED FOR FUTURE REFILLS 05/13/22   Belva Crome, MD  Dolutegravir-lamiVUDine 50-300 MG TABS Take 1 tablet by mouth daily.  06/03/18   [provider]  empagliflozin (JARDIANCE) 10 MG TABS tablet Take 1 tablet (10 mg total) by mouth daily before breakfast. 06/18/22   Belva Crome, MD  imiquimod Leroy Sea) 5 % cream Use 3 times a week at bedtime. 09/18/22   Flossie Dibble, NP  metoprolol succinate (TOPROL-XL) 25 MG 24 hr tablet Take 1/2 (one-half) tablet by mouth twice daily 05/13/22   Belva Crome, MD  nitroGLYCERIN (NITROSTAT) 0.4 MG SL tablet Place 1 tablet (0.4 mg total) under the tongue every 5 (five) minutes x 3 doses as needed for chest pain. 03/19/18   Belva Crome, MD  oxyCODONE-acetaminophen (PERCOCET) 10-325 MG  tablet Take 0.5-1 tablets by mouth every 4 (four) hours as needed for pain.    [provider]  sacubitril-valsartan (ENTRESTO) 24-26 MG Take 1 tablet by mouth 2 (two) times daily. 05/03/22   Elgie Collard, PA-C      Allergies    Chocolate, Lactose intolerance (gi), Milk (cow), and Vicodin [hydrocodone-acetaminophen]    Review of Systems   See HPI  Physical Exam Updated Vital Signs BP 106/75   Pulse 72   Temp 100.1 F (37.8 C) (Oral)   Resp 16   SpO2 100%  Physical Exam Vitals and nursing note reviewed.  Constitutional:      General: He is not in acute distress.    Appearance: He is well-developed.  HENT:     Head: Normocephalic and atraumatic.     Mouth/Throat:     Mouth: Mucous membranes are moist.     Pharynx: Oropharynx is clear.  Eyes:     Conjunctiva/sclera: Conjunctivae normal.  Cardiovascular:     Rate and Rhythm: Normal rate and regular rhythm.     Heart sounds: No murmur heard. Pulmonary:     Effort: Pulmonary effort is normal. No respiratory distress.     Breath sounds: Normal breath sounds.  Abdominal:     Palpations: Abdomen is soft.     Tenderness: There is no abdominal tenderness.  Musculoskeletal:        General:  No swelling.     Cervical back: Normal range of motion and neck supple.     Comments: Patient has DP pulses present and equal on right foot, sensation intact and motor function intact.  Skin:    General: Skin is warm and dry.     Capillary Refill: Capillary refill takes less than 2 seconds.     Findings: Lesion (Large 7 cm gaping wound connecting to a large 6 cm flap wound that is hemostatic on right calf.  Muscle belly intact on exam.) present.  Neurological:     Mental Status: He is alert.  Psychiatric:        Mood and Affect: Mood normal.     ED Results / Procedures / Treatments   Labs (all labs ordered are listed, but only abnormal results are displayed) Labs Reviewed - No data to display  EKG None  Radiology DG  Tibia/Fibula Right  Result Date: 11/11/2022 CLINICAL DATA:  Dog bite.  Pain EXAM: RIGHT TIBIA AND FIBULA - 2 VIEW COMPARISON:  None Available. FINDINGS: Osteopenia. Healed fracture of the proximal fibular neck with mature callus. Is also deformity distally with 2 screws along the distal tibia. Please correlate with history. No underlying acute fracture or dislocation. Soft tissue swelling. No radiopaque foreign body. Bandage medially along the lower leg. IMPRESSION: Changes of remote injury with healed fractures and ORIF of the distal tibia. Osteopenia Electronically Signed   By: Jill Side M.D.   On: 11/11/2022 14:57    Procedures .Marland KitchenLaceration Repair  Date/Time: 11/11/2022 4:00 PM  Performed by: Bradd Canary, MD Authorized by: Teressa Lower, MD   Consent:    Consent obtained:  Verbal   Risks discussed:  Retained foreign body, tendon damage, infection, need for additional repair, poor cosmetic result, poor wound healing, nerve damage and pain   Alternatives discussed:  No treatment Universal protocol:    Imaging studies available: yes     Patient identity confirmed:  Verbally with patient Anesthesia:    Anesthesia method:  Local infiltration   Local anesthetic:  Lidocaine 2% WITH epi Laceration details:    Location:  Leg   Leg location:  R lower leg   Length (cm):  12 Pre-procedure details:    Preparation:  Imaging obtained to evaluate for foreign bodies Exploration:    Limited defect created (wound extended): no     Imaging obtained: x-ray     Imaging outcome: foreign body not noted     Wound exploration: entire depth of wound visualized     Contaminated: no   Treatment:    Area cleansed with:  Saline   Amount of cleaning:  Extensive   Irrigation solution:  Sterile saline   Irrigation method:  Pressure wash   Visualized foreign bodies/material removed: no     Debridement:  None   Undermining:  None Skin repair:    Repair method:  Sutures   Suture size:  3-0   Suture  material:  Prolene   Suture technique:  Horizontal mattress and vertical mattress   Number of sutures:  5 Approximation:    Approximation:  Loose Repair type:    Repair type:  Complex Post-procedure details:    Dressing:  Non-adherent dressing and bulky dressing   Procedure completion:  Tolerated     Medications Ordered in ED Medications  Tdap (BOOSTRIX) injection 0.5 mL (0.5 mLs Intramuscular Given 11/11/22 1540)  oxyCODONE-acetaminophen (PERCOCET/ROXICET) 5-325 MG per tablet 2 tablet (2 tablets Oral Given 11/11/22 1543)  lidocaine-EPINEPHrine (XYLOCAINE  W/EPI) 2 %-1:200000 (PF) injection 20 mL (20 mLs Infiltration Given by Other 11/11/22 1543)  amoxicillin-clavulanate (AUGMENTIN) 875-125 MG per tablet 1 tablet (1 tablet Oral Given 11/11/22 1740)    ED Course/ Medical Decision Making/ A&P                             Medical Decision Making Risk Prescription drug management.   Patient presented hemodynamically stable, x-ray of the right lower leg showed no retained foreign body or fracture underlying the wound.  Complex wound repair was performed bedside for details of procedure see note above.  Dressing was placed and dose of Augmentin was given while in the emergency department.  Patient was also given Tdap due to unknown last tetanus shot.  Patient was cleared for discharge with outpatient antibiotic given.        Final Clinical Impression(s) / ED Diagnoses Final diagnoses:  Dog bite, initial encounter    Rx / DC Orders ED Discharge Orders          Ordered    amoxicillin-clavulanate (AUGMENTIN) 875-125 MG tablet  Every 12 hours        11/11/22 1746              Bradd Canary, MD 11/11/22 2219    Teressa Lower, MD 11/12/22 1357

## 2022-11-11 NOTE — ED Triage Notes (Signed)
Patient here for evaluation after he was chased and bitten multiple times by a pit bull last night. Patient states the owner told him the dog was up to date on vaccinations. Patient did not notify police or animal control. Patient is alert, oriented, and in no apparent distress at this time, patient does not recall his lat tetanus vaccine.

## 2022-11-11 NOTE — ED Provider Triage Note (Addendum)
Emergency Medicine Provider Triage Evaluation Note  Omar Sandoval , a 67 y.o. male  was evaluated in triage.  Pt complains of dog bite on his right leg.  Patient has history of HIV.  Patient states patient states he was bitten by a pit last night and his right leg was where the dog bit him.  Patient states on her stated dog is up-to-date on vaccines and that this is a dog that is owned and can be monitored.  Patient stated he put gauze and duct taped the gauze around his leg to stop the bleeding.  Patient does not recall his last tetanus.  Patient had fevers, chills, nausea/vomiting, abdominal pain, change in sensation/motor skills, overlying skin color changes  Review of Systems  Positive: See HPI Negative: See HPI  Physical Exam  BP 115/70 (BP Location: Right Arm)   Pulse 90   Temp 100.1 F (37.8 C) (Oral)   Resp 14   SpO2 98%  Gen:   Awake, no distress   Resp:  Normal effort  MSK:   Moves extremities without difficulty  Other:  Unable to visualize dog bite in triage as patient has wound wrapped up and duct tape, patient has sensation/motor skills/2+ bilateral dorsalis pedis pulses with regular rate  Medical Decision Making  Medically screening exam initiated at 2:12 PM.  Appropriate orders placed.  DELLA SZAKACS was informed that the remainder of the evaluation will be completed by another provider, this initial triage assessment does not replace that evaluation, and the importance of remaining in the ED until their evaluation is complete.  Workup initiated, patient will be given tetanus here, wound is not visualized in his there is extent of duct tape around his leg and so the wound will be covered until it provide he can see the patient in the back, patient stable at this time.  Patient had a temperature of 100.1 F in triage but was unable to give Tylenol as patient has documented allergy to Tylenol.    Chuck Hint, PA-C 11/11/22 1416

## 2022-11-16 ENCOUNTER — Telehealth: Payer: Self-pay

## 2022-11-16 NOTE — Telephone Encounter (Signed)
Patient called in to state his medication is not at the pharmacy, he was seen on Monday. Looked up information, it was printed. Patient stated all he got was gauze and no paperwork. Called [facility] at [address] per patient request and called in Augmentin as written.

## 2022-11-18 ENCOUNTER — Other Ambulatory Visit: Payer: Self-pay

## 2022-11-18 ENCOUNTER — Emergency Department (HOSPITAL_COMMUNITY)
Admission: EM | Admit: 2022-11-18 | Discharge: 2022-11-18 | Discharge status: Against Medical Advice | Payer: Medicare HMO | Attending: Emergency Medicine | Admitting: Emergency Medicine

## 2022-11-18 DIAGNOSIS — I728 Aneurysm of other specified arteries: Secondary | ICD-10-CM | POA: Insufficient documentation

## 2022-11-18 DIAGNOSIS — T8140XA Infection following a procedure, unspecified, initial encounter: Secondary | ICD-10-CM | POA: Diagnosis not present

## 2022-11-18 DIAGNOSIS — Z5321 Procedure and treatment not carried out due to patient leaving prior to being seen by health care provider: Secondary | ICD-10-CM | POA: Insufficient documentation

## 2022-11-18 DIAGNOSIS — Y838 Other surgical procedures as the cause of abnormal reaction of the patient, or of later complication, without mention of misadventure at the time of the procedure: Secondary | ICD-10-CM | POA: Diagnosis not present

## 2022-11-18 LAB — LACTIC ACID, PLASMA: Lactic Acid, Venous: 1.8 mmol/L (ref 0.5–1.9)

## 2022-11-18 LAB — BASIC METABOLIC PANEL
Anion gap: 12 (ref 5–15)
BUN: <5 mg/dL — ABNORMAL LOW (ref 8–23)
CO2: 28 mmol/L (ref 22–32)
Calcium: 9.1 mg/dL (ref 8.9–10.3)
Chloride: 95 mmol/L — ABNORMAL LOW (ref 98–111)
Creatinine, Ser: 0.79 mg/dL (ref 0.61–1.24)
GFR, Estimated: >60 mL/min (ref >60)
Glucose, Bld: 162 mg/dL — ABNORMAL HIGH (ref 70–99)
Potassium: 3.1 mmol/L — ABNORMAL LOW (ref 3.5–5.1)
Sodium: 135 mmol/L (ref 135–145)

## 2022-11-18 LAB — CBC WITH DIFFERENTIAL/PLATELET
Abs Immature Granulocytes: 0.01 10*3/uL (ref 0.00–0.07)
Basophils Absolute: 0 10*3/uL (ref 0.0–0.1)
Basophils Relative: 1 %
Eosinophils Absolute: 0.1 10*3/uL (ref 0.0–0.5)
Eosinophils Relative: 1 %
HCT: 31.6 % — ABNORMAL LOW (ref 39.0–52.0)
Hemoglobin: 10.8 g/dL — ABNORMAL LOW (ref 13.0–17.0)
Immature Granulocytes: 0 %
Lymphocytes Relative: 29 %
Lymphs Abs: 1.5 10*3/uL (ref 0.7–4.0)
MCH: 34.2 pg — ABNORMAL HIGH (ref 26.0–34.0)
MCHC: 34.2 g/dL (ref 30.0–36.0)
MCV: 100 fL (ref 80.0–100.0)
Monocytes Absolute: 0.8 10*3/uL (ref 0.1–1.0)
Monocytes Relative: 16 %
Neutro Abs: 2.7 10*3/uL (ref 1.7–7.7)
Neutrophils Relative %: 53 %
Platelets: 385 10*3/uL (ref 150–400)
RBC: 3.16 MIL/uL — ABNORMAL LOW (ref 4.22–5.81)
RDW: 13.2 % (ref 11.5–15.5)
WBC: 5.1 10*3/uL (ref 4.0–10.5)
nRBC: 0 % (ref 0.0–0.2)

## 2022-11-18 NOTE — ED Triage Notes (Signed)
Pt presents requesting suture removal after dog bite on 3/17.  Wound has foul odor   Pt states he is compliant with anbx and dressing changes.

## 2022-11-18 NOTE — ED Notes (Signed)
Pt no longer in lobby. Called multiple times

## 2022-11-18 NOTE — ED Provider Triage Note (Signed)
Emergency Medicine Provider Triage Evaluation Note  Omar Sandoval , a 68 y.o. male  was evaluated in triage.  Pt complains of suture removal onset today. Had sutures placed on 11/11/22. Has been compliant with the Augmentin. Denies fever at home.   Review of Systems  Positive:  Negative:   Physical Exam  BP 91/65 (BP Location: Right Arm)   Pulse 94   Temp 98 F (36.7 C)   Resp 16   SpO2 100%  Gen:   Awake, no distress   Resp:  Normal effort  MSK:   Moves extremities without difficulty  Other:  Erythema, edema, increased warmth noted to dog bite on RLE. No appreciable drainage noted, however, malodorous smell noted.   Medical Decision Making  Medically screening exam initiated at 5:33 PM.  Appropriate orders placed.  TAISTO HSIAO was informed that the remainder of the evaluation will be completed by another provider, this initial triage assessment does not replace that evaluation, and the importance of remaining in the ED until their evaluation is complete.  5:33 PM - Discussed with RN that patient is in need of a room immediately. RN aware and working on room placement.    Jayvin Hurrell A, PA-C 11/18/22 1736

## 2022-11-19 ENCOUNTER — Emergency Department (HOSPITAL_COMMUNITY): Payer: Medicare HMO

## 2022-11-19 ENCOUNTER — Observation Stay (HOSPITAL_BASED_OUTPATIENT_CLINIC_OR_DEPARTMENT_OTHER): Payer: Medicare HMO | Admitting: Anesthesiology

## 2022-11-19 ENCOUNTER — Observation Stay (HOSPITAL_COMMUNITY): Payer: Medicare HMO | Admitting: Anesthesiology

## 2022-11-19 ENCOUNTER — Observation Stay (HOSPITAL_COMMUNITY)
Admission: EM | Admit: 2022-11-19 | Discharge: 2022-11-20 | Disposition: A | Discharge status: Home Health | Payer: Medicare HMO | Attending: Family Medicine | Admitting: Family Medicine

## 2022-11-19 ENCOUNTER — Encounter (HOSPITAL_COMMUNITY): Payer: Self-pay | Admitting: Emergency Medicine

## 2022-11-19 ENCOUNTER — Encounter (HOSPITAL_COMMUNITY)
Admission: EM | Disposition: A | Discharge status: Home Health | Payer: Self-pay | Source: Home / Self Care | Attending: Emergency Medicine

## 2022-11-19 ENCOUNTER — Other Ambulatory Visit: Payer: Self-pay

## 2022-11-19 DIAGNOSIS — Z87891 Personal history of nicotine dependence: Secondary | ICD-10-CM | POA: Insufficient documentation

## 2022-11-19 DIAGNOSIS — W540XXS Bitten by dog, sequela: Secondary | ICD-10-CM | POA: Diagnosis not present

## 2022-11-19 DIAGNOSIS — I5042 Chronic combined systolic (congestive) and diastolic (congestive) heart failure: Secondary | ICD-10-CM | POA: Diagnosis present

## 2022-11-19 DIAGNOSIS — Z7902 Long term (current) use of antithrombotics/antiplatelets: Secondary | ICD-10-CM | POA: Diagnosis not present

## 2022-11-19 DIAGNOSIS — I25119 Atherosclerotic heart disease of native coronary artery with unspecified angina pectoris: Secondary | ICD-10-CM | POA: Diagnosis not present

## 2022-11-19 DIAGNOSIS — L03115 Cellulitis of right lower limb: Secondary | ICD-10-CM

## 2022-11-19 DIAGNOSIS — I110 Hypertensive heart disease with heart failure: Secondary | ICD-10-CM

## 2022-11-19 DIAGNOSIS — I25719 Atherosclerosis of autologous vein coronary artery bypass graft(s) with unspecified angina pectoris: Secondary | ICD-10-CM | POA: Diagnosis not present

## 2022-11-19 DIAGNOSIS — Z79899 Other long term (current) drug therapy: Secondary | ICD-10-CM | POA: Diagnosis not present

## 2022-11-19 DIAGNOSIS — I509 Heart failure, unspecified: Secondary | ICD-10-CM

## 2022-11-19 DIAGNOSIS — W540XXA Bitten by dog, initial encounter: Secondary | ICD-10-CM | POA: Diagnosis not present

## 2022-11-19 DIAGNOSIS — E785 Hyperlipidemia, unspecified: Secondary | ICD-10-CM | POA: Diagnosis present

## 2022-11-19 DIAGNOSIS — I252 Old myocardial infarction: Secondary | ICD-10-CM

## 2022-11-19 DIAGNOSIS — S81851A Open bite, right lower leg, initial encounter: Principal | ICD-10-CM | POA: Insufficient documentation

## 2022-11-19 DIAGNOSIS — B20 Human immunodeficiency virus [HIV] disease: Secondary | ICD-10-CM

## 2022-11-19 DIAGNOSIS — Z23 Encounter for immunization: Secondary | ICD-10-CM | POA: Diagnosis not present

## 2022-11-19 DIAGNOSIS — G4733 Obstructive sleep apnea (adult) (pediatric): Secondary | ICD-10-CM

## 2022-11-19 DIAGNOSIS — L02419 Cutaneous abscess of limb, unspecified: Secondary | ICD-10-CM

## 2022-11-19 DIAGNOSIS — Z203 Contact with and (suspected) exposure to rabies: Secondary | ICD-10-CM | POA: Diagnosis not present

## 2022-11-19 DIAGNOSIS — Z21 Asymptomatic human immunodeficiency virus [HIV] infection status: Secondary | ICD-10-CM | POA: Diagnosis present

## 2022-11-19 DIAGNOSIS — L089 Local infection of the skin and subcutaneous tissue, unspecified: Secondary | ICD-10-CM

## 2022-11-19 HISTORY — PX: I & D EXTREMITY: SHX5045

## 2022-11-19 LAB — CBC WITH DIFFERENTIAL/PLATELET
Abs Immature Granulocytes: 0.01 10*3/uL (ref 0.00–0.07)
Basophils Absolute: 0 10*3/uL (ref 0.0–0.1)
Basophils Relative: 1 %
Eosinophils Absolute: 0.1 10*3/uL (ref 0.0–0.5)
Eosinophils Relative: 1 %
HCT: 32.1 % — ABNORMAL LOW (ref 39.0–52.0)
Hemoglobin: 10.7 g/dL — ABNORMAL LOW (ref 13.0–17.0)
Immature Granulocytes: 0 %
Lymphocytes Relative: 18 %
Lymphs Abs: 0.9 10*3/uL (ref 0.7–4.0)
MCH: 34.1 pg — ABNORMAL HIGH (ref 26.0–34.0)
MCHC: 33.3 g/dL (ref 30.0–36.0)
MCV: 102.2 fL — ABNORMAL HIGH (ref 80.0–100.0)
Monocytes Absolute: 0.8 10*3/uL (ref 0.1–1.0)
Monocytes Relative: 15 %
Neutro Abs: 3.3 10*3/uL (ref 1.7–7.7)
Neutrophils Relative %: 65 %
Platelets: 401 10*3/uL — ABNORMAL HIGH (ref 150–400)
RBC: 3.14 MIL/uL — ABNORMAL LOW (ref 4.22–5.81)
RDW: 13.2 % (ref 11.5–15.5)
WBC: 5.1 10*3/uL (ref 4.0–10.5)
nRBC: 0 % (ref 0.0–0.2)

## 2022-11-19 LAB — COMPREHENSIVE METABOLIC PANEL
ALT: 14 U/L (ref 0–44)
AST: 17 U/L (ref 15–41)
Albumin: 3 g/dL — ABNORMAL LOW (ref 3.5–5.0)
Alkaline Phosphatase: 71 U/L (ref 38–126)
Anion gap: 11 (ref 5–15)
BUN: <5 mg/dL — ABNORMAL LOW (ref 8–23)
CO2: 25 mmol/L (ref 22–32)
Calcium: 8.8 mg/dL — ABNORMAL LOW (ref 8.9–10.3)
Chloride: 101 mmol/L (ref 98–111)
Creatinine, Ser: 0.66 mg/dL (ref 0.61–1.24)
GFR, Estimated: >60 mL/min (ref >60)
Glucose, Bld: 91 mg/dL (ref 70–99)
Potassium: 3.2 mmol/L — ABNORMAL LOW (ref 3.5–5.1)
Sodium: 137 mmol/L (ref 135–145)
Total Bilirubin: 0.8 mg/dL (ref 0.3–1.2)
Total Protein: 6.4 g/dL — ABNORMAL LOW (ref 6.5–8.1)

## 2022-11-19 LAB — SURGICAL PCR SCREEN
MRSA, PCR: NEGATIVE
Staphylococcus aureus: NEGATIVE

## 2022-11-19 LAB — SEDIMENTATION RATE: Sed Rate: 59 mm/hr — ABNORMAL HIGH (ref 0–16)

## 2022-11-19 LAB — MAGNESIUM: Magnesium: 1.7 mg/dL (ref 1.7–2.4)

## 2022-11-19 LAB — C-REACTIVE PROTEIN: CRP: 5.6 mg/dL — ABNORMAL HIGH (ref <1.0)

## 2022-11-19 LAB — LACTIC ACID, PLASMA: Lactic Acid, Venous: 1.3 mmol/L (ref 0.5–1.9)

## 2022-11-19 SURGERY — IRRIGATION AND DEBRIDEMENT EXTREMITY
Anesthesia: General | Site: Leg Lower | Laterality: Right

## 2022-11-19 MED ORDER — POTASSIUM CHLORIDE 20 MEQ PO PACK
40.0000 meq | PACK | Freq: Once | ORAL | Status: AC
  Administered 2022-11-20: 40 meq via ORAL
  Filled 2022-11-19: qty 2

## 2022-11-19 MED ORDER — POVIDONE-IODINE 10 % EX SWAB
2.0000 | Freq: Once | CUTANEOUS | Status: AC
  Administered 2022-11-19: 2 via TOPICAL

## 2022-11-19 MED ORDER — OXYCODONE HCL 5 MG PO TABS: 5.0000 mg | ORAL_TABLET | Freq: Four times a day (QID) | ORAL | Status: DC | PRN

## 2022-11-19 MED ORDER — CEFAZOLIN SODIUM-DEXTROSE 2-4 GM/100ML-% IV SOLN
INTRAVENOUS | Status: AC
  Filled 2022-11-19: qty 100

## 2022-11-19 MED ORDER — LACTATED RINGERS IV SOLN: INTRAVENOUS | Status: DC

## 2022-11-19 MED ORDER — 0.9 % SODIUM CHLORIDE (POUR BTL) OPTIME
TOPICAL | Status: DC | PRN
  Administered 2022-11-19: 1000 mL

## 2022-11-19 MED ORDER — SACUBITRIL-VALSARTAN 24-26 MG PO TABS
1.0000 | ORAL_TABLET | Freq: Two times a day (BID) | ORAL | Status: DC
  Administered 2022-11-19 – 2022-11-20 (×2): 1 via ORAL
  Filled 2022-11-19 (×3): qty 1

## 2022-11-19 MED ORDER — DEXAMETHASONE SODIUM PHOSPHATE 10 MG/ML IJ SOLN
INTRAMUSCULAR | Status: AC
  Filled 2022-11-19: qty 3

## 2022-11-19 MED ORDER — RABIES VACCINE, PCEC IM SUSR
1.0000 mL | Freq: Once | INTRAMUSCULAR | Status: AC
  Administered 2022-11-19: 1 mL via INTRAMUSCULAR
  Filled 2022-11-19: qty 1

## 2022-11-19 MED ORDER — PHENYLEPHRINE 80 MCG/ML (10ML) SYRINGE FOR IV PUSH (FOR BLOOD PRESSURE SUPPORT)
PREFILLED_SYRINGE | INTRAVENOUS | Status: AC
  Filled 2022-11-19: qty 10

## 2022-11-19 MED ORDER — PROMETHAZINE HCL 25 MG/ML IJ SOLN: 6.2500 mg | INTRAMUSCULAR | Status: DC | PRN

## 2022-11-19 MED ORDER — DOXYCYCLINE HYCLATE 100 MG PO CAPS: 100.0000 mg | ORAL_CAPSULE | Freq: Two times a day (BID) | ORAL | 0 refills | Status: DC

## 2022-11-19 MED ORDER — ORAL CARE MOUTH RINSE: 15.0000 mL | Freq: Once | OROMUCOSAL | Status: AC

## 2022-11-19 MED ORDER — FENTANYL CITRATE (PF) 250 MCG/5ML IJ SOLN
INTRAMUSCULAR | Status: DC | PRN
  Administered 2022-11-19: 75 ug via INTRAVENOUS
  Administered 2022-11-19: 25 ug via INTRAVENOUS
  Administered 2022-11-19: 50 ug via INTRAVENOUS

## 2022-11-19 MED ORDER — ACETAMINOPHEN 500 MG PO TABS
ORAL_TABLET | ORAL | Status: AC
  Filled 2022-11-19: qty 2

## 2022-11-19 MED ORDER — OXYCODONE HCL 5 MG PO TABS: 5.0000 mg | ORAL_TABLET | Freq: Once | ORAL | Status: DC | PRN

## 2022-11-19 MED ORDER — MIDAZOLAM HCL 2 MG/2ML IJ SOLN
INTRAMUSCULAR | Status: DC | PRN
  Administered 2022-11-19: 2 mg via INTRAVENOUS

## 2022-11-19 MED ORDER — ACETAMINOPHEN 650 MG RE SUPP: 650.0000 mg | Freq: Four times a day (QID) | RECTAL | Status: DC | PRN

## 2022-11-19 MED ORDER — NITROGLYCERIN 0.4 MG SL SUBL: 0.4000 mg | SUBLINGUAL_TABLET | SUBLINGUAL | Status: DC | PRN

## 2022-11-19 MED ORDER — VANCOMYCIN HCL IN DEXTROSE 1-5 GM/200ML-% IV SOLN
1000.0000 mg | Freq: Two times a day (BID) | INTRAVENOUS | Status: DC
  Administered 2022-11-20: 1000 mg via INTRAVENOUS
  Filled 2022-11-19: qty 200

## 2022-11-19 MED ORDER — OXYCODONE HCL 5 MG PO TABS
5.0000 mg | ORAL_TABLET | Freq: Four times a day (QID) | ORAL | Status: DC | PRN
  Administered 2022-11-19: 10 mg via ORAL
  Filled 2022-11-19 (×2): qty 2

## 2022-11-19 MED ORDER — ONDANSETRON HCL 4 MG/2ML IJ SOLN
INTRAMUSCULAR | Status: AC
  Filled 2022-11-19: qty 6

## 2022-11-19 MED ORDER — VANCOMYCIN HCL 1750 MG/350ML IV SOLN
1750.0000 mg | Freq: Once | INTRAVENOUS | Status: AC
  Administered 2022-11-19: 1750 mg via INTRAVENOUS
  Filled 2022-11-19 (×2): qty 350

## 2022-11-19 MED ORDER — MEPERIDINE HCL 25 MG/ML IJ SOLN: 6.2500 mg | INTRAMUSCULAR | Status: DC | PRN

## 2022-11-19 MED ORDER — CHLORHEXIDINE GLUCONATE 0.12 % MT SOLN
15.0000 mL | Freq: Once | OROMUCOSAL | Status: AC
  Administered 2022-11-19: 15 mL via OROMUCOSAL

## 2022-11-19 MED ORDER — EMPAGLIFLOZIN 10 MG PO TABS
10.0000 mg | ORAL_TABLET | Freq: Every day | ORAL | Status: DC
  Administered 2022-11-20: 10 mg via ORAL
  Filled 2022-11-19: qty 1

## 2022-11-19 MED ORDER — CIPROFLOXACIN HCL 500 MG PO TABS: 500.0000 mg | ORAL_TABLET | Freq: Two times a day (BID) | ORAL | 0 refills | Status: DC

## 2022-11-19 MED ORDER — SODIUM CHLORIDE 0.9 % IV SOLN
3.0000 g | Freq: Four times a day (QID) | INTRAVENOUS | Status: DC
  Administered 2022-11-20 (×2): 3 g via INTRAVENOUS
  Filled 2022-11-19 (×2): qty 8

## 2022-11-19 MED ORDER — ATORVASTATIN CALCIUM 40 MG PO TABS: 40.0000 mg | ORAL_TABLET | Freq: Every day | ORAL | Status: DC

## 2022-11-19 MED ORDER — PROPOFOL 10 MG/ML IV BOLUS
INTRAVENOUS | Status: AC
  Filled 2022-11-19: qty 20

## 2022-11-19 MED ORDER — PHENYLEPHRINE 80 MCG/ML (10ML) SYRINGE FOR IV PUSH (FOR BLOOD PRESSURE SUPPORT)
PREFILLED_SYRINGE | INTRAVENOUS | Status: DC | PRN
  Administered 2022-11-19 (×3): 80 ug via INTRAVENOUS
  Administered 2022-11-19: 160 ug via INTRAVENOUS
  Administered 2022-11-19: 80 ug via INTRAVENOUS

## 2022-11-19 MED ORDER — PIPERACILLIN-TAZOBACTAM 3.375 G IVPB 30 MIN
3.3750 g | Freq: Once | INTRAVENOUS | Status: AC
  Administered 2022-11-19: 3.375 g via INTRAVENOUS
  Filled 2022-11-19: qty 50

## 2022-11-19 MED ORDER — PHENYLEPHRINE HCL-NACL 20-0.9 MG/250ML-% IV SOLN
INTRAVENOUS | Status: DC | PRN
  Administered 2022-11-19: 25 ug/min via INTRAVENOUS

## 2022-11-19 MED ORDER — CLOPIDOGREL BISULFATE 75 MG PO TABS: 75.0000 mg | ORAL_TABLET | Freq: Every day | ORAL | Status: DC

## 2022-11-19 MED ORDER — PROPOFOL 10 MG/ML IV BOLUS
INTRAVENOUS | Status: DC | PRN
  Administered 2022-11-19: 110 mg via INTRAVENOUS

## 2022-11-19 MED ORDER — DEXAMETHASONE SODIUM PHOSPHATE 10 MG/ML IJ SOLN
INTRAMUSCULAR | Status: DC | PRN
  Administered 2022-11-19: 10 mg via INTRAVENOUS

## 2022-11-19 MED ORDER — OXYCODONE HCL 5 MG/5ML PO SOLN: 5.0000 mg | Freq: Once | ORAL | Status: DC | PRN

## 2022-11-19 MED ORDER — IOHEXOL 350 MG/ML SOLN
75.0000 mL | Freq: Once | INTRAVENOUS | Status: AC | PRN
  Administered 2022-11-19: 75 mL via INTRAVENOUS

## 2022-11-19 MED ORDER — EPHEDRINE SULFATE-NACL 50-0.9 MG/10ML-% IV SOSY
PREFILLED_SYRINGE | INTRAVENOUS | Status: DC | PRN
  Administered 2022-11-19 (×2): 10 mg via INTRAVENOUS

## 2022-11-19 MED ORDER — LIDOCAINE 2% (20 MG/ML) 5 ML SYRINGE
INTRAMUSCULAR | Status: AC
  Filled 2022-11-19: qty 15

## 2022-11-19 MED ORDER — LIDOCAINE 2% (20 MG/ML) 5 ML SYRINGE
INTRAMUSCULAR | Status: DC | PRN
  Administered 2022-11-19: 80 mg via INTRAVENOUS

## 2022-11-19 MED ORDER — FENTANYL CITRATE (PF) 250 MCG/5ML IJ SOLN
INTRAMUSCULAR | Status: AC
  Filled 2022-11-19: qty 5

## 2022-11-19 MED ORDER — RABIES IMMUNE GLOBULIN 150 UNIT/ML IM INJ
1500.0000 [IU] | INJECTION | Freq: Once | INTRAMUSCULAR | Status: AC
  Administered 2022-11-19: 1500 [IU] via INTRAMUSCULAR
  Filled 2022-11-19: qty 10

## 2022-11-19 MED ORDER — ACETAMINOPHEN 500 MG PO TABS: 1000.0000 mg | ORAL_TABLET | Freq: Once | ORAL | Status: DC

## 2022-11-19 MED ORDER — POLYETHYLENE GLYCOL 3350 17 G PO PACK: 17.0000 g | PACK | Freq: Every day | ORAL | Status: DC | PRN

## 2022-11-19 MED ORDER — SODIUM CHLORIDE 0.9 % IR SOLN
Status: DC | PRN
  Administered 2022-11-19: 3000 mL

## 2022-11-19 MED ORDER — ONDANSETRON HCL 4 MG/2ML IJ SOLN
INTRAMUSCULAR | Status: DC | PRN
  Administered 2022-11-19: 4 mg via INTRAVENOUS

## 2022-11-19 MED ORDER — ACETAMINOPHEN 325 MG PO TABS
650.0000 mg | ORAL_TABLET | Freq: Four times a day (QID) | ORAL | Status: DC | PRN
  Administered 2022-11-19: 650 mg via ORAL
  Filled 2022-11-19: qty 2

## 2022-11-19 MED ORDER — HYDROMORPHONE HCL 1 MG/ML IJ SOLN: 0.2500 mg | INTRAMUSCULAR | Status: DC | PRN

## 2022-11-19 MED ORDER — CEFAZOLIN SODIUM-DEXTROSE 2-4 GM/100ML-% IV SOLN
2.0000 g | INTRAVENOUS | Status: AC
  Administered 2022-11-19: 2 g via INTRAVENOUS

## 2022-11-19 MED ORDER — CHLORHEXIDINE GLUCONATE 4 % EX LIQD: 60.0000 mL | Freq: Once | CUTANEOUS | Status: DC

## 2022-11-19 MED ORDER — METOPROLOL SUCCINATE ER 25 MG PO TB24
12.5000 mg | ORAL_TABLET | Freq: Two times a day (BID) | ORAL | Status: DC
  Administered 2022-11-19 – 2022-11-20 (×2): 12.5 mg via ORAL
  Filled 2022-11-19 (×2): qty 1

## 2022-11-19 MED ORDER — DOLUTEGRAVIR-LAMIVUDINE 50-300 MG PO TABS
1.0000 | ORAL_TABLET | Freq: Every day | ORAL | Status: DC
  Administered 2022-11-20: 1 via ORAL
  Filled 2022-11-19: qty 1

## 2022-11-19 MED ORDER — MIDAZOLAM HCL 2 MG/2ML IJ SOLN
INTRAMUSCULAR | Status: AC
  Filled 2022-11-19: qty 2

## 2022-11-19 MED ORDER — AMISULPRIDE (ANTIEMETIC) 5 MG/2ML IV SOLN: 10.0000 mg | Freq: Once | INTRAVENOUS | Status: DC | PRN

## 2022-11-19 MED ORDER — AMOXICILLIN-POT CLAVULANATE 875-125 MG PO TABS: 1.0000 | ORAL_TABLET | Freq: Two times a day (BID) | ORAL | 0 refills | Status: DC

## 2022-11-19 MED ORDER — SODIUM CHLORIDE 0.9% FLUSH
3.0000 mL | Freq: Two times a day (BID) | INTRAVENOUS | Status: DC
  Administered 2022-11-19 – 2022-11-20 (×2): 3 mL via INTRAVENOUS

## 2022-11-19 SURGICAL SUPPLY — 57 items
BAG COUNTER SPONGE SURGICOUNT (BAG) IMPLANT
BNDG COHESIVE 4X5 TAN STRL (GAUZE/BANDAGES/DRESSINGS) ×1 IMPLANT
BNDG ELASTIC 4X5.8 VLCR STR LF (GAUZE/BANDAGES/DRESSINGS) IMPLANT
BNDG GAUZE DERMACEA FLUFF 4 (GAUZE/BANDAGES/DRESSINGS) ×2 IMPLANT
BRUSH SCRUB EZ PLAIN DRY (MISCELLANEOUS) ×2 IMPLANT
CANISTER PREVENA PLUS 150 (CANNISTER) IMPLANT
CANISTER WOUNDNEG PRESSURE 500 (CANNISTER) IMPLANT
COVER SURGICAL LIGHT HANDLE (MISCELLANEOUS) ×2 IMPLANT
DRAPE DERMATAC (DRAPES) IMPLANT
DRAPE INCISE IOBAN 66X45 STRL (DRAPES) IMPLANT
DRAPE U-SHAPE 47X51 STRL (DRAPES) ×1 IMPLANT
DRESSING VERAFLO CLEANS CC MED (GAUZE/BANDAGES/DRESSINGS) IMPLANT
DRSG ADAPTIC 3X8 NADH LF (GAUZE/BANDAGES/DRESSINGS) ×1 IMPLANT
DRSG MEPITEL 4X7.2 (GAUZE/BANDAGES/DRESSINGS) IMPLANT
DRSG VERAFLO CLEANSE CC MED (GAUZE/BANDAGES/DRESSINGS) ×1
ELECT REM PT RETURN 9FT ADLT (ELECTROSURGICAL)
ELECTRODE REM PT RTRN 9FT ADLT (ELECTROSURGICAL) IMPLANT
GAUZE PAD ABD 8X10 STRL (GAUZE/BANDAGES/DRESSINGS) IMPLANT
GAUZE SPONGE 4X4 12PLY STRL (GAUZE/BANDAGES/DRESSINGS) ×1 IMPLANT
GLOVE BIO SURGEON STRL SZ7.5 (GLOVE) ×1 IMPLANT
GLOVE BIO SURGEON STRL SZ8 (GLOVE) ×1 IMPLANT
GLOVE BIOGEL PI IND STRL 7.5 (GLOVE) ×1 IMPLANT
GLOVE BIOGEL PI IND STRL 8 (GLOVE) ×1 IMPLANT
GLOVE BIOGEL PI IND STRL 9 (GLOVE) ×1 IMPLANT
GLOVE SURG ORTHO LTX SZ7.5 (GLOVE) ×2 IMPLANT
GOWN STRL REUS W/ TWL LRG LVL3 (GOWN DISPOSABLE) ×2 IMPLANT
GOWN STRL REUS W/ TWL XL LVL3 (GOWN DISPOSABLE) ×1 IMPLANT
GOWN STRL REUS W/TWL LRG LVL3 (GOWN DISPOSABLE) ×2
GOWN STRL REUS W/TWL XL LVL3 (GOWN DISPOSABLE) ×1
GRAFT MYRIAD 3 LAYER 7X10 (Graft) IMPLANT
HANDPIECE INTERPULSE COAX TIP (DISPOSABLE)
HIBICLENS CHG 4% 4OZ BTL (MISCELLANEOUS) IMPLANT
KIT BASIN OR (CUSTOM PROCEDURE TRAY) ×1 IMPLANT
KIT DRSG PREVENA PLUS 7DAY 125 (MISCELLANEOUS) IMPLANT
KIT TURNOVER KIT B (KITS) ×1 IMPLANT
MANIFOLD NEPTUNE II (INSTRUMENTS) ×1 IMPLANT
NS IRRIG 1000ML POUR BTL (IV SOLUTION) ×1 IMPLANT
PACK ORTHO EXTREMITY (CUSTOM PROCEDURE TRAY) ×1 IMPLANT
PAD ARMBOARD 7.5X6 YLW CONV (MISCELLANEOUS) ×2 IMPLANT
PAD CAST 4YDX4 CTTN HI CHSV (CAST SUPPLIES) IMPLANT
PAD NEG PRESSURE SENSATRAC (MISCELLANEOUS) IMPLANT
PADDING CAST COTTON 4X4 STRL (CAST SUPPLIES) ×1
PADDING CAST COTTON 6X4 STRL (CAST SUPPLIES) ×1 IMPLANT
POWDER MYRIAD MORCELLS 1000MG (Miscellaneous) IMPLANT
SET HNDPC FAN SPRY TIP SCT (DISPOSABLE) IMPLANT
SOL PREP POV-IOD 4OZ 10% (MISCELLANEOUS) ×1 IMPLANT
SOL SCRUB PVP POV-IOD 4OZ 7.5% (MISCELLANEOUS) ×1
SOLUTION SCRB POV-IOD 4OZ 7.5% (MISCELLANEOUS) ×1 IMPLANT
SPONGE T-LAP 18X18 ~~LOC~~+RFID (SPONGE) ×1 IMPLANT
STOCKINETTE IMPERVIOUS 9X36 MD (GAUZE/BANDAGES/DRESSINGS) IMPLANT
SUT PDS AB 2-0 CT1 27 (SUTURE) IMPLANT
TOWEL GREEN STERILE (TOWEL DISPOSABLE) ×2 IMPLANT
TOWEL GREEN STERILE FF (TOWEL DISPOSABLE) ×1 IMPLANT
TUBE CONNECTING 12X1/4 (SUCTIONS) ×1 IMPLANT
UNDERPAD 30X36 HEAVY ABSORB (UNDERPADS AND DIAPERS) ×1 IMPLANT
WATER STERILE IRR 1000ML POUR (IV SOLUTION) ×1 IMPLANT
YANKAUER SUCT BULB TIP NO VENT (SUCTIONS) ×1 IMPLANT

## 2022-11-19 NOTE — Brief Op Note (Signed)
11/19/2022  5:41 PM  PATIENT:  Omar Sandoval  67 y.o. male  PRE-OPERATIVE DIAGNOSIS:  Dog Bite Right Lower Leg  [id number]

## 2022-11-19 NOTE — Consult Note (Signed)
Reason for Consult:Right calf wound Referring Physician: Tretha Sciara Time called: [id number] Time at bedside: [place] is an 67 y.o. male.  HPI: Omar Sandoval was bitten by an unknown dog about 10d ago. The wound was repaired and he was discharged. He returns today for suture removal. He's noted increased swelling and pain in the last few days. Due to the wound appearance orthopedic surgery was consulted.   Past Medical History:  Diagnosis Date   HIV (human immunodeficiency virus infection) ([place])    Immune deficiency disorder ([place])    Inguinal hernia    Left   Myocardial infarction [facility])    01-2017 angioplasty with stents    Past Surgical History:  Procedure Laterality Date   COLONOSCOPY     CORONARY ANGIOPLASTY     CORONARY/GRAFT ACUTE MI REVASCULARIZATION N/A 02/03/2017   Procedure: Coronary/Graft Acute MI Revascularization;  Surgeon: Belva Crome, MD;  Location: [place] [facility];  Service: Cardiovascular;  Laterality: N/A;   FRACTURE SURGERY     left ankle and right foot   HERNIA REPAIR     INGUINAL HERNIA REPAIR Left 08/27/2017   Procedure: LAPAROSCOPIC LEFT INGUINAL HERNIA REPAIR WITH MESH;  Surgeon: Ralene Ok, MD;  Location: [place];  Service: General;  Laterality: Left;   INSERTION OF MESH Left 08/27/2017   Procedure: INSERTION OF MESH;  Surgeon: Ralene Ok, MD;  Location: [place];  Service: General;  Laterality: Left;   LEFT HEART CATH AND CORONARY ANGIOGRAPHY N/A 02/03/2017   Procedure: Left Heart Cath and Coronary Angiography;  Surgeon: Belva Crome, MD;  Location: [place] [facility];  Service: Cardiovascular;  Laterality: N/A;   LEFT HEART CATH AND CORONARY ANGIOGRAPHY N/A 09/25/2018   Procedure: LEFT HEART CATH AND CORONARY ANGIOGRAPHY;  Surgeon: Belva Crome, MD;  Location: [place] [facility];  Service: Cardiovascular;  Laterality: N/A;   OPEN REDUCTION INTERNAL FIXATION (ORIF) DISTAL RADIAL FRACTURE Right 07/20/2016   Procedure: OPEN REDUCTION INTERNAL  FIXATION (ORIF) DISTAL RADIAL FRACTURE;  Surgeon: Iran Planas, MD;  Location: [place];  Service: Orthopedics;  Laterality: Right;   RIGHT/LEFT HEART CATH AND CORONARY ANGIOGRAPHY N/A 08/02/2022   Procedure: RIGHT/LEFT HEART CATH AND CORONARY ANGIOGRAPHY;  Surgeon: Belva Crome, MD;  Location: [place] [facility];  Service: Cardiovascular;  Laterality: N/A;   SURGERY SCROTAL / TESTICULAR     nondecended testes    Family History  Problem Relation Age of Onset   Cancer Mother        lung   Cancer Brother        lung, liver, and colon    Social History:  reports that he quit smoking about 11 years ago. His smoking use included cigarettes. He has a 22.50 pack-year smoking history. He has never used smokeless tobacco. He reports current alcohol use of about 3.0 standard drinks of alcohol per week. He reports current drug use. Drug: Marijuana.  Allergies:  Allergies  Allergen Reactions   Chocolate Diarrhea   Lactose Intolerance (Gi) Diarrhea and Nausea And Vomiting   Milk (Cow) Diarrhea   Vicodin [Hydrocodone-Acetaminophen] Nausea Only and Other (See Comments)    GI Upset    Medications: I have reviewed the patient's current medications.  Results for orders placed or performed during the hospital encounter of 11/19/22 (from the past 48 hour(s))  CBC with Differential     Status: Abnormal   Collection Time: 11/19/22  9:32 AM  Result Value Ref Range   WBC 5.1  4.0 - 10.5 K/uL   RBC 3.14 (L) 4.22 - 5.81 MIL/uL   Hemoglobin 10.7 (L) 13.0 - 17.0 g/dL   HCT 32.1 (L) 39.0 - 52.0 %   MCV 102.2 (H) 80.0 - 100.0 fL   MCH 34.1 (H) 26.0 - 34.0 pg   MCHC 33.3 30.0 - 36.0 g/dL   RDW 13.2 11.5 - 15.5 %   Platelets 401 (H) 150 - 400 K/uL   nRBC 0.0 0.0 - 0.2 %   Neutrophils Relative % 65 %   Neutro Abs 3.3 1.7 - 7.7 K/uL   Lymphocytes Relative 18 %   Lymphs Abs 0.9 0.7 - 4.0 K/uL   Monocytes Relative 15 %   Monocytes Absolute 0.8 0.1 - 1.0 K/uL   Eosinophils Relative 1 %   Eosinophils  Absolute 0.1 0.0 - 0.5 K/uL   Basophils Relative 1 %   Basophils Absolute 0.0 0.0 - 0.1 K/uL   Immature Granulocytes 0 %   Abs Immature Granulocytes 0.01 0.00 - 0.07 K/uL    Comment: Performed at [facility] [address] [place], [place] [address]    DG Tibia/Fibula Right  Result Date: 11/19/2022 CLINICAL DATA:  Dog bite EXAM: RIGHT TIBIA AND FIBULA - 2 VIEW COMPARISON:  11/11/2022 FINDINGS: No recent fracture or dislocation is seen. There are 2 surgical screws in the distal tibia. There is deformity in the proximal shaft of right fibula suggesting old healed fracture. Smooth marginated calcifications are noted adjacent to the tips of medial and lateral malleoli residual from previous injury. There is soft tissue swelling in the lower leg and around the ankle. There are scattered vascular calcifications. IMPRESSION: No fracture or dislocation is seen. There are no focal lytic lesions. No significant interval changes are noted. Electronically Signed   By: Elmer Picker M.D.   On: 11/19/2022 10:14    Review of Systems  Constitutional:  Negative for chills, diaphoresis and fever.  HENT:  Negative for ear discharge, ear pain, hearing loss and tinnitus.   Eyes:  Negative for photophobia and pain.  Respiratory:  Negative for cough and shortness of breath.   Cardiovascular:  Negative for chest pain.  Gastrointestinal:  Negative for abdominal pain, nausea and vomiting.  Genitourinary:  Negative for dysuria, flank pain, frequency and urgency.  Musculoskeletal:  Positive for arthralgias (Right lower leg). Negative for back pain, myalgias and neck pain.  Neurological:  Negative for dizziness and headaches.  Hematological:  Does not bruise/bleed easily.  Psychiatric/Behavioral:  The patient is not nervous/anxious.    Blood pressure 107/69, pulse 86, temperature 99.5 F (37.5 C), temperature source Oral, resp. rate 16, weight 77 kg, SpO2 95 %. Physical Exam Constitutional:       General: He is not in acute distress.    Appearance: He is well-developed. He is not diaphoretic.  HENT:     Head: Normocephalic and atraumatic.  Eyes:     General: No scleral icterus.       Right eye: No discharge.        Left eye: No discharge.     Conjunctiva/sclera: Conjunctivae normal.  Cardiovascular:     Rate and Rhythm: Normal rate and regular rhythm.  Pulmonary:     Effort: Pulmonary effort is normal. No respiratory distress.  Musculoskeletal:     Cervical back: Normal range of motion.     Comments: RLE Large irregular wound post calf with necrotic elements. Calf swollen and mod TTP from proximal calf to ankle. No odor  or purulent discharge. No fluctuance.  No knee or ankle effusion  Knee stable to varus/ valgus and anterior/posterior stress  Sens DPN, SPN, TN intact  Motor EHL, ext, flex, evers 5/5  DP 2+, PT 2+, No significant edema  Skin:    General: Skin is warm and dry.  Neurological:     Mental Status: He is alert.  Psychiatric:        Mood and Affect: Mood normal.        Behavior: Behavior normal.    Assessment/Plan: Right calf wound -- Recommend medical admission for IV abx and surgical debridement (currently scheduled for this afternoon). Pt may leave AMA to take care of his dogs and return tomorrow. I have advised against this but understand his predicament. He will let us know.    Lisette Abu, PA-C Orthopedic Surgery [phone] 11/19/2022, 10:49 AM

## 2022-11-19 NOTE — Discharge Instructions (Addendum)
                                   RABIES VACCINE FOLLOW UP  Patient's Name: Omar Sandoval                     Original Order Date:11/19/2022  Medical Record Number: [id number]  ED Physician: Tretha Sciara, MD Primary Diagnosis: Rabies Exposure       PCP: Eyvonne Mechanic, MD  Patient Phone Number: (home) [phone] (home)    (cell)  Telephone Information:  Mobile [phone]    (work) There is no work phone number on file. Species of Animal:     You have been seen in the Emergency Department for a possible rabies exposure. It's very important you return for the additional vaccine doses.  Please call the clinic listed below for hours of operation.   Clinic that will administer your rabies vaccines:    DAY 0:  11/19/2022      DAY 3:  11/22/2022       DAY 7:  11/26/2022     DAY 14:  12/03/2022         The 5th vaccine injection is considered for immune compromised patients only.  DAY 28:  12/17/2022

## 2022-11-19 NOTE — Progress Notes (Addendum)
Pt placed on hospital Resmed CPAP unit (5 cmH20). No distress noted. RT to follow.

## 2022-11-19 NOTE — Anesthesia Preprocedure Evaluation (Addendum)
Anesthesia Evaluation  Patient identified by MRN, date of birth, ID band Patient awake    Reviewed: Allergy & Precautions, NPO status , Patient's Chart, lab work & pertinent test results  Airway Mallampati: II  TM Distance: >3 FB Neck ROM: Full    Dental  (+) Teeth Intact, Dental Advisory Given, Edentulous Upper, Edentulous Lower   Pulmonary sleep apnea , Patient abstained from smoking., former smoker   breath sounds clear to auscultation       Cardiovascular hypertension, Pt. on home beta blockers and Pt. on medications + angina  + CAD, + Past MI and +CHF   Rhythm:Regular Rate:Normal  LHC 07/2022 CONCLUSIONS: Normal right heart pressures.  In fact the patient seems somewhat volume depleted.  Mean capillary wedge pressure 2 mmHg LV gram reveals dyskinesis of inferobasal and mid inferior wall.  Normal anterior wall motion.  EF 40%.  LVEDP 6 mmHg. Coronary anatomy is static compared to the most recent angiogram from 2020. There is 40% in-stent restenosis in the right coronary. There is eccentric 70 % stenosis in the LAD crossing over a moderate first diagonal.  Ostial LAD is less than 50%. Distal left main is 40%. Distal circumflex disease is 50 to 60%, showing some improvement compared to prior.  RECOMMENDATIONS: Continue aggressive risk factor modification. Guideline directed medical therapy of systolic heart failure.   Echo 04/2022  1. Left ventricular ejection fraction, by estimation, is 35 to 40%. Left ventricular ejection fraction by 3D volume is 32 %. The left ventricle has moderately decreased function. The left ventricle demonstrates regional wall motion abnormalities (see scoring diagram/findings for description). The left ventricular internal cavity size was mildly dilated. Left ventricular diastolic parameters are consistent with Grade I diastolic dysfunction (impaired relaxation). There is akinesis of the left  ventricular, entire inferior wall. There is hypokinesis of the left ventricular, basal-mid inferoseptal wall and inferolateral wall.   2. Right ventricular systolic function is normal. The right ventricular size is normal. Tricuspid regurgitation signal is inadequate for assessing PA pressure.   3. The mitral valve is normal in structure. No evidence of mitral valve regurgitation.   4. The aortic valve is tricuspid. There is moderate calcification of the aortic valve. There is moderate thickening of the aortic valve. Aortic valve regurgitation is not visualized. Aortic valve sclerosis/calcification is present, without any evidence of aortic stenosis.   5. Aortic dilatation noted. There is borderline dilatation of the aortic root, measuring 39 mm. There is mild dilatation of the ascending aorta, measuring 40 mm.   6. The inferior vena cava is normal in size with greater than 50% respiratory variability, suggesting right atrial pressure of 3 mmHg.   Comparison(s): The left ventricular function is worsened. The left ventricular wall motion abnormality is worse.    NM Stress 04/2022   Findings are consistent with prior myocardial infarction. The study is intermediate risk.   No ST deviation was noted.   LV perfusion is abnormal. Defect 1: There is a large defect with severe reduction in uptake present in the apical to basal inferior location(s) that is fixed. There is abnormal wall motion in the defect area. Consistent with infarction.   Left ventricular function is abnormal. There was a single regional abnormality. Nuclear stress EF: 46 %. The left ventricular ejection fraction is mildly decreased (45-54%). End diastolic cavity size is mildly enlarged. End systolic cavity size is mildly enlarged.   Prior study available for comparison from 09/21/2018.   Large inferior infarct from apex to base  with no ischemia Estimated EF 46%    Neuro/Psych    GI/Hepatic   Endo/Other    Renal/GU       Musculoskeletal   Abdominal   Peds  Hematology  (+) HIV  Anesthesia Other Findings   Reproductive/Obstetrics                             Anesthesia Physical Anesthesia Plan  ASA: 4  Anesthesia Plan: General   Post-op Pain Management: Tylenol PO (pre-op)*   Induction: Intravenous  PONV Risk Score and Plan: 3 and Ondansetron, Dexamethasone, Treatment may vary due to age or medical condition and Midazolam  Airway Management Planned: LMA  Additional Equipment:   Intra-op Plan:   Post-operative Plan: Extubation in OR  Informed Consent: I have reviewed the patients History and Physical, chart, labs and discussed the procedure including the risks, benefits and alternatives for the proposed anesthesia with the patient or authorized representative who has indicated his/her understanding and acceptance.     Dental advisory given  Plan Discussed with: CRNA  Anesthesia Plan Comments:         Anesthesia Quick Evaluation

## 2022-11-19 NOTE — ED Provider Notes (Signed)
[facility] Provider Note   CSN: [id number] Arrival date & time: 11/19/22  0901     History  Chief Complaint  Patient presents with   Wound Check    Omar Sandoval is a 67 y.o. male.   Wound Check     This is a very pleasant 67 year old male with history of HIV presenting to the emergency department due to wound.  Patient was bit by a dog on 11/11/2022, he has been taking Augmentin as prescribed twice daily.  Despite that the lower extremity has been erythematous, leaking pus and very painful.  He has not noticed any fevers at home, he is able to ambulate.  Home Medications Prior to Admission medications   Medication Sig Start Date End Date Taking? Authorizing Provider  amoxicillin-clavulanate (AUGMENTIN) 875-125 MG tablet Take 1 tablet by mouth every 12 (twelve) hours. 11/19/22  Yes Sherrill Raring, PA-C  ciprofloxacin (CIPRO) 500 MG tablet Take 1 tablet (500 mg total) by mouth every 12 (twelve) hours. 11/19/22  Yes Sherrill Raring, PA-C  clopidogrel (PLAVIX) 75 MG tablet TAKE 1 TABLET BY MOUTH ONCE DAILY . APPOINTMENT REQUIRED FOR FUTURE REFILLS 05/13/22  Yes Belva Crome, MD  doxycycline (VIBRAMYCIN) 100 MG capsule Take 1 capsule (100 mg total) by mouth 2 (two) times daily. 11/19/22  Yes Sherrill Raring, PA-C  atorvastatin (LIPITOR) 40 MG tablet TAKE 1 TABLET BY MOUTH ONCE DAILY AT 6PM . 06/18/22   Belva Crome, MD  Dolutegravir-lamiVUDine 50-300 MG TABS Take 1 tablet by mouth daily.  06/03/18   [provider]  empagliflozin (JARDIANCE) 10 MG TABS tablet Take 1 tablet (10 mg total) by mouth daily before breakfast. 06/18/22   Belva Crome, MD  imiquimod Leroy Sea) 5 % cream Use 3 times a week at bedtime. 09/18/22   Flossie Dibble, NP  metoprolol succinate (TOPROL-XL) 25 MG 24 hr tablet Take 1/2 (one-half) tablet by mouth twice daily 05/13/22   Belva Crome, MD  nitroGLYCERIN (NITROSTAT) 0.4 MG SL tablet Place 1 tablet (0.4 mg total)  under the tongue every 5 (five) minutes x 3 doses as needed for chest pain. 03/19/18   Belva Crome, MD  oxyCODONE-acetaminophen (PERCOCET) 10-325 MG tablet Take 0.5-1 tablets by mouth every 4 (four) hours as needed for pain.    [provider]  sacubitril-valsartan (ENTRESTO) 24-26 MG Take 1 tablet by mouth 2 (two) times daily. 05/03/22   Elgie Collard, PA-C      Allergies    Chocolate, Lactose intolerance (gi), Milk (cow), and Vicodin [hydrocodone-acetaminophen]    Review of Systems   Review of Systems  Physical Exam Updated Vital Signs BP (!) 121/58   Pulse 71   Temp 99.1 F (37.3 C)   Resp 17   Ht 5\' 6"  (1.676 m)   Wt 77 kg   SpO2 93%   BMI 27.40 kg/m  Physical Exam Vitals and nursing note reviewed. Exam conducted with a chaperone present.  Constitutional:      General: He is not in acute distress.    Appearance: Normal appearance.  HENT:     Head: Normocephalic and atraumatic.  Eyes:     General: No scleral icterus.    Extraocular Movements: Extraocular movements intact.     Pupils: Pupils are equal, round, and reactive to light.  Cardiovascular:     Pulses: Normal pulses.  Musculoskeletal:     Right lower leg: Edema present.  Skin:  Coloration: Skin is not jaundiced.     Findings: Erythema present.     Comments: Large gaping malodorous wound right calf with surrounding erythema and warmth.-see photo attached  Neurological:     Mental Status: He is alert. Mental status is at baseline.     Coordination: Coordination normal.        ED Results / Procedures / Treatments   Labs (all labs ordered are listed, but only abnormal results are displayed) Labs Reviewed  CBC WITH DIFFERENTIAL/PLATELET - Abnormal; Notable for the following components:      Result Value   RBC 3.14 (*)    Hemoglobin 10.7 (*)    HCT 32.1 (*)    MCV 102.2 (*)    MCH 34.1 (*)    Platelets 401 (*)    All other components within normal limits  SEDIMENTATION RATE - Abnormal;  Notable for the following components:   Sed Rate 59 (*)    All other components within normal limits  C-REACTIVE PROTEIN - Abnormal; Notable for the following components:   CRP 5.6 (*)    All other components within normal limits  COMPREHENSIVE METABOLIC PANEL - Abnormal; Notable for the following components:   Potassium 3.2 (*)    BUN <5 (*)    Calcium 8.8 (*)    Total Protein 6.4 (*)    Albumin 3.0 (*)    All other components within normal limits  CULTURE, BLOOD (ROUTINE X 2)  CULTURE, BLOOD (ROUTINE X 2)  SURGICAL PCR SCREEN  LACTIC ACID, PLASMA  MAGNESIUM    EKG None  Radiology CT TIBIA FIBULA RIGHT W CONTRAST  Result Date: 11/19/2022 CLINICAL DATA:  Patient status post dog bite 10 days ago. Right lower leg pain and swelling. EXAM: CT OF THE LOWER RIGHT EXTREMITY WITH CONTRAST TECHNIQUE: Multidetector CT imaging of the lower right extremity was performed according to the standard protocol following intravenous contrast administration. RADIATION DOSE REDUCTION: This exam was performed according to the departmental dose-optimization program which includes automated exposure control, adjustment of the mA and/or kV according to patient size and/or use of iterative reconstruction technique. CONTRAST:  75 mL OMNIPAQUE IOHEXOL 350 MG/ML SOLN COMPARISON:  Plain films right tib fib today. FINDINGS: Bones/Joint/Cartilage No acute bony or joint abnormality is seen. No bony destructive change or periosteal reaction. Screw fragments in the distal tibia are noted. There is some ossification of the syndesmosis of the distal tibia and fibula. Ligaments Suboptimally assessed by CT. Muscles and Tendons No intramuscular fluid collection or gas.  Intact. Soft tissues There is infiltration of subcutaneous fat about the lower leg. A focus of gas along the superficial fascia of the medial gastrocnemius is subjacent to a skin wound. Fluid deep to the wound measures approximately 6.6 cm craniocaudal by 2.5 cm  AP by 1.4 cm transverse. It has Hounsfield units of approximately 65 consistent with hemorrhage. No rim enhancing fluid collection is identified. IMPRESSION: 1. Skin wound with underlying hematoma in the medial lower leg. Negative for rim enhancing fluid collection to suggest abscess. 2. Infiltration of subcutaneous fat of the lower leg could be due to dependent change or cellulitis. 3. Negative for osteomyelitis or other acute bony or joint abnormality. Electronically Signed   By: Inge Rise M.D.   On: 11/19/2022 14:14   DG Tibia/Fibula Right  Result Date: 11/19/2022 CLINICAL DATA:  Dog bite EXAM: RIGHT TIBIA AND FIBULA - 2 VIEW COMPARISON:  11/11/2022 FINDINGS: No recent fracture or dislocation is seen. There are 2 surgical  screws in the distal tibia. There is deformity in the proximal shaft of right fibula suggesting old healed fracture. Smooth marginated calcifications are noted adjacent to the tips of medial and lateral malleoli residual from previous injury. There is soft tissue swelling in the lower leg and around the ankle. There are scattered vascular calcifications. IMPRESSION: No fracture or dislocation is seen. There are no focal lytic lesions. No significant interval changes are noted. Electronically Signed   By: Elmer Picker M.D.   On: 11/19/2022 10:14    Procedures .Suture Removal  Date/Time: 11/19/2022 11:31 AM  Performed by: Melvyn Novas Authorized by: Sherrill Raring, PA-C   Consent:    Consent obtained:  Verbal   Consent given by:  Patient   Risks, benefits, and alternatives were discussed: yes     Risks discussed:  Bleeding and pain   Alternatives discussed:  No treatment Universal protocol:    Procedure explained and questions answered to patient or proxy's satisfaction: yes     Relevant documents present and verified: yes     Test results available: yes     Imaging studies available: yes     Required blood products, implants, devices, and special  equipment available: yes     Site/side marked: yes     Immediately prior to procedure, a time out was called: yes     Patient identity confirmed:  Verbally with patient Location:    Location:  Lower extremity   Lower extremity location:  Leg   Leg location:  R lower leg Procedure details:    Wound appearance:  Purulent, tender, red and warm   Number of sutures removed:  5   Number of staples removed:  0 Post-procedure details:    Post-removal:  No dressing applied   Procedure completion:  Tolerated     Medications Ordered in ED Medications  chlorhexidine (HIBICLENS) 4 % liquid 4 Application (has no administration in time range)  ceFAZolin (ANCEF) IVPB 2g/100 mL premix (has no administration in time range)  dolutegravir-lamiVUDine (DOVATO) 50-300 MG per tablet 1 tablet ( Oral Automatically Held 11/28/22 1000)  sacubitril-valsartan (ENTRESTO) 24-26 mg per tablet ( Oral Automatically Held 11/27/22 2200)  metoprolol succinate (TOPROL-XL) 24 hr tablet 12.5 mg ( Oral Automatically Held 11/27/22 2200)  atorvastatin (LIPITOR) tablet 40 mg ( Oral Automatically Held 11/28/22 1800)  empagliflozin (JARDIANCE) tablet 10 mg ( Oral Automatically Held 11/28/22 0800)  sodium chloride flush (NS) 0.9 % injection 3 mL ( Intravenous Automatically Held 11/27/22 2200)  acetaminophen (TYLENOL) tablet 650 mg ( Oral MAR Hold 11/19/22 1505)    Or  acetaminophen (TYLENOL) suppository 650 mg ( Rectal MAR Hold 11/19/22 1505)  oxyCODONE (Oxy IR/ROXICODONE) immediate release tablet 5 mg ( Oral MAR Hold 11/19/22 1505)  polyethylene glycol (MIRALAX / GLYCOLAX) packet 17 g ( Oral MAR Hold 11/19/22 1505)  vancomycin (VANCOREADY) IVPB 1750 mg/350 mL (1,750 mg Intravenous New Bag/Given 11/19/22 1542)    Followed by  vancomycin (VANCOCIN) IVPB 1000 mg/200 mL premix ( Intravenous Automatically Held 11/28/22 1500)  Ampicillin-Sulbactam (UNASYN) 3 g in sodium chloride 0.9 % 100 mL IVPB ( Intravenous Automatically Held 11/27/22 1800)   potassium chloride (KLOR-CON) packet 40 mEq ( Oral Automatically Held 11/19/22 2000)  acetaminophen (TYLENOL) tablet 1,000 mg (1,000 mg Oral Patient Refused/Not Given 11/19/22 1516)  ceFAZolin (ANCEF) 2-4 GM/100ML-% IVPB (has no administration in time range)  lactated ringers infusion ( Intravenous New Bag/Given 11/19/22 1524)  rabies immune globulin (HYPERRAB/KEDRAB) injection 1,500 Units (1,500 Units  Intramuscular Given 11/19/22 1014)  rabies vaccine (RABAVERT) injection 1 mL (1 mL Intramuscular Given 11/19/22 1010)  piperacillin-tazobactam (ZOSYN) IVPB 3.375 g (0 g Intravenous Stopped 11/19/22 1039)  iohexol (OMNIPAQUE) 350 MG/ML injection 75 mL (75 mLs Intravenous Contrast Given 11/19/22 1402)  povidone-iodine 10 % swab 2 Application (2 Applications Topical Given 11/19/22 1517)  chlorhexidine (PERIDEX) 0.12 % solution 15 mL (15 mLs Mouth/Throat Given 11/19/22 1525)    Or  Oral care mouth rinse ( Mouth Rinse See Alternative 11/19/22 1525)    ED Course/ Medical Decision Making/ A&P Clinical Course as of 11/19/22 1548  Tue Nov 19, 2022  0925 I was consulted regarding this patient's care. I evaluated personally at bedside.  67 year old male presenting after a dog bite last week.  Wound was irrigated, animal control was involved.  Concerning rabies: The dogs were not able to be found by animal control.  Patient needs to be started on rabies series. Concerning the dog bite itself, patient has been compliant with Augmentin therapy but wound is warm indurated edematous erythematous and quite tender to the touch.  Likely developing cellulitic changes around.  Plan for labs, x-ray likely admission on IV antibiotics due to failure of outpatient antibiotics. [CC]  [id number] I consulted pharmacy regarding antibiotic choice, given failed outpatient Augmentin would recommend broadening with Zosyn at this time  [HS]  0944 I consulted orthopedic due to concern the patient may need debridement, PA Hilbert Odor will  evaluate the patient. [HS]  [id number] Patient was seen by orthopedics, they are requesting CT to evaluate for likely deeper soft tissue abscess.  This has been ordered.  Patient states he has 5 dogs at home, unable to arrange care for him.  Patient will not be willing to stay for admission.  Orthopedic surgery is aware of this, plan is to obtain the CT, follow-up closely with orthopedics.  Patient will also need to return to the ED in 2 days for rabies vaccination series. [HS]  [id number] I confirm the again regarding outpatient biotics, they recommend adding on doxycycline for MRSA coverage.  We can extend the Augmentin. [HS]  [id number] After CT result and conversation with orthopedics patient is agreeable to stay for admission.  He is able to arrange dog care at home.  I will consult hospitalist for admission. [HS]  [id number] I discussed case with hospitalist.  They agreed with admission. [CC]    Clinical Course User Index [CC] Tretha Sciara, MD [HS] Sherrill Raring, [place]                             Medical Decision Making Amount and/or Complexity of Data Reviewed Labs: ordered. Radiology: ordered.  Risk Prescription drug management. Decision regarding hospitalization.   Patient presents due to dog bite.  Differential includes abscess, cellulitis, necrotizing fasciitis, sepsis.  On exam patient temperature is elevated although not technically febrile, concern for infected dog bite.  Will check labs, plain film and consult orthopedics.  Will check lactic and blood cultures and proceed with empiric antibiotics.  I consulted pharmacy, orthopedics as documented in hospital course.  No leukocytosis, not anemic.  No AKI.  CRP and ESR elevated, lactate within normal limits.  Blood cultures obtained and pending.  Pain film negative for any bony abnormality.  CT tib/fib with contrast R is pending.  Patient has an abscess, scheduled for surgical debridement today.  He has had 1 dose of Zosyn so far.  Orthopedic  surgery requesting  hospitalist admit for continued IV antibiotics.  I will make the consult.  Patient will be admitted for infected dog bite.        Final Clinical Impression(s) / ED Diagnoses Final diagnoses:  Wound infection  Rabies exposure    Rx / DC Orders ED Discharge Orders          Ordered    doxycycline (VIBRAMYCIN) 100 MG capsule  2 times daily        11/19/22 1137    amoxicillin-clavulanate (AUGMENTIN) 875-125 MG tablet  Every 12 hours        11/19/22 1137    ciprofloxacin (CIPRO) 500 MG tablet  Every 12 hours        11/19/22 1256              Sherrill Raring, PA-C 11/19/22 1548    Tretha Sciara, MD 11/21/22 1606

## 2022-11-19 NOTE — Op Note (Signed)
Omar Sandoval, [place] MEDICAL RECORD NO: [id number] ACCOUNT NO: [id number] DATE OF BIRTH: Sep 16, 1955 FACILITY: [facility] LOCATION: [facility] PHYSICIAN: Doralee Albino. Carola Frost, MD  Operative Report   DATE OF PROCEDURE: 11/19/2022  PREOPERATIVE DIAGNOSIS:  Right leg dog bite complicated by infection.  POSTOPERATIVE DIAGNOSIS:  Right leg dog bite complicated by infection.  PROCEDURES: 1.  Debridement of skin, subcutaneous tissue and fascia, sharp excision 10 x 7 cm. 2.  Application of biologic graft using myriad 7 x 10 cm sheet and 2000 mg of morsels. 3.  Application of wound VAC, again, 7 x 10 cm, right leg.  SURGEON:  Myrene Galas, MD  ASSISTANT:  Montez Morita, PA-C.  ANESTHESIA:  General.  COMPLICATIONS:  None.  TOURNIQUET:  None.  SPECIMENS:  None.  DISPOSITION:  To PACU.  CONDITION:  Stable.  BRIEF SUMMARY OF INDICATIONS FOR PROCEDURE:  The patient is a 67 year old male who was bitten several days ago by a dog, which he states was now one of his own.  He was seen over a day later in the Emergency Department where they performed suture closure.  The  patient returned for followup and was noted to have purulent drainage from the wound.  I discussed with him the risks and benefits of debridement including the need for serial procedures, persistent infection, recurrent infection, need for skin grafting or other interventions in addition to anesthetic risk.  He acknowledged these risks and did provide consent to proceed.  BRIEF SUMMARY OF PROCEDURE:  The patient was taken to the operating room where general anesthesia was induced.  He did receive broad-spectrum antibiotics preoperatively with Zosyn.  The right lower extremity was prepped and draped in the usual sterile  fashion after induction of general anesthesia using chlorhexidine wash and then Betadine scrub and paint.  Timeout was held after draping. I began with the 10 blade scalpel sharply excising devitalized skin, subcutaneous tissue and  fascia.  There was some  tunneling distally along the lateral edge of the wound.  This debridement resulted in a full thickness defect all the way down to the soleus and including some of the muscle, which was a 10 x 7 cm x 2-3 cm deep.  Chlorhexidine soap along with a 3000 mL of saline were washed through the area.  We then  applied fresh drape gloves and used fresh instruments.  I obtained a 7 x 10 cm Myriad sheet, sewed this into place with PDS and then followed with application of 2000 mg of Myriad morsels including placement into the tunneled area distally.  We then closed  the sheet over top and applied a wound VAC after placing a Mepitel layer between the wound VAC and the myriad.  Montez Morita, PA-C was present and assisting throughout.  The patient was taken to the PACU in stable condition.  PROGNOSIS:  The patient is at elevated risk of persistent infection because of the magnitude and degree of injury as well as his immunocompromise with a HIV diagnosis.  This is mitigated by the fact that it actually looked quite good and the surrounding  tissues intraoperatively in spite of the foci of necrosis centrally and that portion was excised.  His CD4 count is reportedly at a healthy level.  He will receive antibiotics overnight and then be discharged on oral antibiotics tomorrow with Augmentin  and doxycycline.  We will plan to see him back in the office in 8 days for wound check.   SHY D: 11/19/2022 5:50:50 pm T: 11/19/2022 9:03:00  pm  JOB: [id number]

## 2022-11-19 NOTE — ED Triage Notes (Signed)
Pt states he is here to have his stitches removed from his right leg. Pt was here yesterday for the same, but left early due to long wait. Pt is worried it may be infected.States site is swelling. Site is covered with dressing.

## 2022-11-19 NOTE — Anesthesia Procedure Notes (Signed)
Procedure Name: LMA Insertion Date/Time: 11/19/2022 4:14 PM  Performed by: Carolan Clines, CRNAPre-anesthesia Checklist: Patient identified, Emergency Drugs available, Suction available and Patient being monitored Patient Re-evaluated:Patient Re-evaluated prior to induction Oxygen Delivery Method: Circle System Utilized Preoxygenation: Pre-oxygenation with 100% oxygen Induction Type: IV induction LMA: LMA inserted LMA Size: 4.0 Number of attempts: 1 Placement Confirmation: positive ETCO2 Tube secured with: Tape Dental Injury: Teeth and Oropharynx as per pre-operative assessment

## 2022-11-19 NOTE — Progress Notes (Signed)
Pharmacy Antibiotic Note  Omar Sandoval is a 67 y.o. male admitted on 11/19/2022 with  right calf wound .  Pharmacy has been consulted for vancomycin and Unasyn dosing. Patient received one dose of Zosyn in ED.  Plan: Unasyn 3 grams IV Q6H Vancomycin 1750 mg IV once followed by 1000 mg Q12H (eAUC 498, Vd 0.72, Scr 0.8) Follow signs/symptoms of infection Obtain vancomycin levels as indicated  Height: 5\' 6"  (167.6 cm) Weight: 77 kg (169 lb 12.1 oz) IBW/kg (Calculated) : 63.8  Temp (24hrs), Avg:98.9 F (37.2 C), Min:98 F (36.7 C), Max:99.5 F (37.5 C)  Recent Labs  Lab 11/18/22 1740 11/19/22 0917 11/19/22 0932 11/19/22 1110  WBC 5.1  --  5.1  --   CREATININE 0.79  --   --  0.66  LATICACIDVEN 1.8 1.3  --   --     Estimated Creatinine Clearance: 88.8 mL/min (by C-G formula based on SCr of 0.66 mg/dL).    Allergies  Allergen Reactions   Chocolate Diarrhea   Lactose Intolerance (Gi) Diarrhea and Nausea And Vomiting   Milk (Cow) Diarrhea   Vicodin [Hydrocodone-Acetaminophen] Nausea Only and Other (See Comments)    GI Upset    Antimicrobials this admission: Zosyn 3/26 x1 Vancomycin 3/26 >>  Unasyn 3/26 >>  Dose adjustments this admission:  Microbiology results:  Thank you for allowing pharmacy to be a part of this patient's care.  Jeneen Rinks [id number] PM

## 2022-11-19 NOTE — H&P (Addendum)
History and Physical   Omar Sandoval [id number] DOB: 23-Apr-1956 DOA: 11/19/2022  PCP: Eyvonne Mechanic, MD   Patient coming from: Home  Chief Complaint: Leg wound  HPI: Omar Sandoval is a 67 y.o. male with medical history significant of OSA, CAD status post stent, hyperlipidemia, HIV, hypertension, chronic combined systolic and diastolic CHF, alcohol use, marijuana use, LPR presenting with ongoing leg wound.  Patient reported was bitten by dog on 3/18 and was evaluated in the ED the next day.  Had some sutures placed and was prescribed Augmentin which she has been taking as prescribed twice daily.  Reports worsening erythema, edema, warmth of the wound despite taking medication.  Also has noticed some purulent drainage.  Does deny any fevers and is able to ambulate.  Denies chills, chest pain, shortness of breath, abdominal pain, constipation, diarrhea, nausea, vomiting.  ED Course: Signs in the ED notable for blood pressure in the [id number] to [id number] systolic.  Lab workup included CMP with potassium 3.2, calcium 8.8, protein 614, albumin 3.0.  CBC was notable for normal WBC, hemoglobin stable at 10.7, platelets 401.  Lactic acid normal.  AST 59 and CRP 5.6.  Blood cultures pending.  X-ray of the right tib-fib showed no acute bony abnormality.  CT of the right tib-fib showed skin wound with underlying hematoma versus abscess though hematoma favored as well as evidence of cellulitis but no evidence of osteomyelitis.  Orthopedics was consulted and recommended above CT scan and given findings of possible abscess recommended patient be admitted for observation for debridement.  Patient received Zosyn, rabies immunoglobulin, rabies vaccine in the ED.  Review of Systems: As per HPI otherwise all other systems reviewed and are negative.  Past Medical History:  Diagnosis Date   HIV (human immunodeficiency virus infection) ([place])    Immune deficiency disorder ([place])    Inguinal hernia    Left   Myocardial  infarction [facility])    01-2017 angioplasty with stents    Past Surgical History:  Procedure Laterality Date   COLONOSCOPY     CORONARY ANGIOPLASTY     CORONARY/GRAFT ACUTE MI REVASCULARIZATION N/A 02/03/2017   Procedure: Coronary/Graft Acute MI Revascularization;  Surgeon: Belva Crome, MD;  Location: [place] [facility];  Service: Cardiovascular;  Laterality: N/A;   FRACTURE SURGERY     left ankle and right foot   HERNIA REPAIR     INGUINAL HERNIA REPAIR Left 08/27/2017   Procedure: LAPAROSCOPIC LEFT INGUINAL HERNIA REPAIR WITH MESH;  Surgeon: Ralene Ok, MD;  Location: [place];  Service: General;  Laterality: Left;   INSERTION OF MESH Left 08/27/2017   Procedure: INSERTION OF MESH;  Surgeon: Ralene Ok, MD;  Location: [place];  Service: General;  Laterality: Left;   LEFT HEART CATH AND CORONARY ANGIOGRAPHY N/A 02/03/2017   Procedure: Left Heart Cath and Coronary Angiography;  Surgeon: Belva Crome, MD;  Location: [place] [facility];  Service: Cardiovascular;  Laterality: N/A;   LEFT HEART CATH AND CORONARY ANGIOGRAPHY N/A 09/25/2018   Procedure: LEFT HEART CATH AND CORONARY ANGIOGRAPHY;  Surgeon: Belva Crome, MD;  Location: [place] [facility];  Service: Cardiovascular;  Laterality: N/A;   OPEN REDUCTION INTERNAL FIXATION (ORIF) DISTAL RADIAL FRACTURE Right 07/20/2016   Procedure: OPEN REDUCTION INTERNAL FIXATION (ORIF) DISTAL RADIAL FRACTURE;  Surgeon: Iran Planas, MD;  Location: [place];  Service: Orthopedics;  Laterality: Right;   RIGHT/LEFT HEART CATH AND CORONARY ANGIOGRAPHY N/A 08/02/2022   Procedure: RIGHT/LEFT HEART CATH AND CORONARY  ANGIOGRAPHY;  Surgeon: Belva Crome, MD;  Location: [place] [facility];  Service: Cardiovascular;  Laterality: N/A;   SURGERY SCROTAL / TESTICULAR     nondecended testes    Social History  reports that he quit smoking about 11 years ago. His smoking use included cigarettes. He has a 22.50 pack-year smoking history. He has never used smokeless  tobacco. He reports current alcohol use of about 3.0 standard drinks of alcohol per week. He reports current drug use. Drug: Marijuana.  Allergies  Allergen Reactions   Chocolate Diarrhea   Lactose Intolerance (Gi) Diarrhea and Nausea And Vomiting   Milk (Cow) Diarrhea   Vicodin [Hydrocodone-Acetaminophen] Nausea Only and Other (See Comments)    GI Upset    Family History  Problem Relation Age of Onset   Cancer Mother        lung   Cancer Brother        lung, liver, and colon  Reviewed on admission reviewed on admission  Prior to Admission medications   Medication Sig Start Date End Date Taking? Authorizing Provider  amoxicillin-clavulanate (AUGMENTIN) 875-125 MG tablet Take 1 tablet by mouth every 12 (twelve) hours. 11/19/22  Yes Sherrill Raring, PA-C  ciprofloxacin (CIPRO) 500 MG tablet Take 1 tablet (500 mg total) by mouth every 12 (twelve) hours. 11/19/22  Yes Sherrill Raring, PA-C  doxycycline (VIBRAMYCIN) 100 MG capsule Take 1 capsule (100 mg total) by mouth 2 (two) times daily. 11/19/22  Yes Sherrill Raring, PA-C  atorvastatin (LIPITOR) 40 MG tablet TAKE 1 TABLET BY MOUTH ONCE DAILY AT 6PM . 06/18/22   Belva Crome, MD  clopidogrel (PLAVIX) 75 MG tablet TAKE 1 TABLET BY MOUTH ONCE DAILY . APPOINTMENT REQUIRED FOR FUTURE REFILLS 05/13/22   Belva Crome, MD  Dolutegravir-lamiVUDine 50-300 MG TABS Take 1 tablet by mouth daily.  06/03/18   [provider]  empagliflozin (JARDIANCE) 10 MG TABS tablet Take 1 tablet (10 mg total) by mouth daily before breakfast. 06/18/22   Belva Crome, MD  imiquimod [place]) 5 % cream Use 3 times a week at bedtime. 09/18/22   Flossie Dibble, NP  metoprolol succinate (TOPROL-XL) 25 MG 24 hr tablet Take 1/2 (one-half) tablet by mouth twice daily 05/13/22   Belva Crome, MD  nitroGLYCERIN (NITROSTAT) 0.4 MG SL tablet Place 1 tablet (0.4 mg total) under the tongue every 5 (five) minutes x 3 doses as needed for chest pain. 03/19/18   Belva Crome, MD   oxyCODONE-acetaminophen (PERCOCET) 10-325 MG tablet Take 0.5-1 tablets by mouth every 4 (four) hours as needed for pain.    [provider]  sacubitril-valsartan (ENTRESTO) 24-26 MG Take 1 tablet by mouth 2 (two) times daily. 05/03/22   Elgie Collard, PA-C    Physical Exam: Vitals:   11/19/22 0906 11/19/22 0910 11/19/22 1104 11/19/22 1205  BP: 107/69   (!) 121/58  Pulse: 86   80  Resp: 16   16  Temp:  99.5 F (37.5 C)  99.1 F (37.3 C)  TempSrc:  Oral    SpO2: 95%   100%  Weight:  77 kg 77 kg   Height:   5\' 6"  (1.676 m)     Physical Exam Constitutional:      General: He is not in acute distress.    Appearance: Normal appearance.  HENT:     Head: Normocephalic and atraumatic.     Mouth/Throat:     Mouth: Mucous membranes are moist.  Pharynx: Oropharynx is clear.  Eyes:     Extraocular Movements: Extraocular movements intact.     Pupils: Pupils are equal, round, and reactive to light.  Cardiovascular:     Rate and Rhythm: Normal rate and regular rhythm.     Pulses: Normal pulses.     Heart sounds: Normal heart sounds.  Pulmonary:     Effort: Pulmonary effort is normal. No respiratory distress.     Breath sounds: Normal breath sounds.  Abdominal:     General: Bowel sounds are normal. There is no distension.     Palpations: Abdomen is soft.     Tenderness: There is no abdominal tenderness.  Musculoskeletal:        General: No swelling or deformity.     Comments: Lower extremity bite wound status post several sutures with surrounding erythema, edema, warmth.  Neurovascularly intact bilateral lower extremity.  Skin:    General: Skin is warm and dry.  Neurological:     General: No focal deficit present.     Mental Status: Mental status is at baseline.    Labs on Admission: I have personally reviewed following labs and imaging studies  CBC: Recent Labs  Lab 11/18/22 1740 11/19/22 0932  WBC 5.1 5.1  NEUTROABS 2.7 3.3  HGB 10.8* 10.7*  HCT 31.6* 32.1*   MCV 100.0 102.2*  PLT 385 401*    Basic Metabolic Panel: Recent Labs  Lab 11/18/22 1740 11/19/22 1110  NA 135 137  K 3.1* 3.2*  CL 95* 101  CO2 28 25  GLUCOSE 162* 91  BUN <5* <5*  CREATININE 0.79 0.66  CALCIUM 9.1 8.8*    GFR: Estimated Creatinine Clearance: 88.8 mL/min (by C-G formula based on SCr of 0.66 mg/dL).  Liver Function Tests: Recent Labs  Lab 11/19/22 1110  AST 17  ALT 14  ALKPHOS 71  BILITOT 0.8  PROT 6.4*  ALBUMIN 3.0*    Urine analysis:    Component Value Date/Time   COLORURINE STRAW (A) 11/02/2022 1239   APPEARANCEUR CLEAR 11/02/2022 1239   LABSPEC 1.003 (L) 11/02/2022 1239   PHURINE 5.0 11/02/2022 1239   GLUCOSEU NEGATIVE 11/02/2022 1239   HGBUR SMALL (A) 11/02/2022 1239   BILIRUBINUR NEGATIVE 11/02/2022 1239   KETONESUR NEGATIVE 11/02/2022 1239   PROTEINUR NEGATIVE 11/02/2022 1239   UROBILINOGEN 0.2 12/11/2013 1129   NITRITE NEGATIVE 11/02/2022 1239   LEUKOCYTESUR NEGATIVE 11/02/2022 1239    Radiological Exams on Admission: CT TIBIA FIBULA RIGHT W CONTRAST  Result Date: 11/19/2022 CLINICAL DATA:  Patient status post dog bite 10 days ago. Right lower leg pain and swelling. EXAM: CT OF THE LOWER RIGHT EXTREMITY WITH CONTRAST TECHNIQUE: Multidetector CT imaging of the lower right extremity was performed according to the standard protocol following intravenous contrast administration. RADIATION DOSE REDUCTION: This exam was performed according to the departmental dose-optimization program which includes automated exposure control, adjustment of the mA and/or kV according to patient size and/or use of iterative reconstruction technique. CONTRAST:  75 mL OMNIPAQUE IOHEXOL 350 MG/ML SOLN COMPARISON:  Plain films right tib fib today. FINDINGS: Bones/Joint/Cartilage No acute bony or joint abnormality is seen. No bony destructive change or periosteal reaction. Screw fragments in the distal tibia are noted. There is some ossification of the syndesmosis  of the distal tibia and fibula. Ligaments Suboptimally assessed by CT. Muscles and Tendons No intramuscular fluid collection or gas.  Intact. Soft tissues There is infiltration of subcutaneous fat about the lower leg. A focus of gas along the  superficial fascia of the medial gastrocnemius is subjacent to a skin wound. Fluid deep to the wound measures approximately 6.6 cm craniocaudal by 2.5 cm AP by 1.4 cm transverse. It has Hounsfield units of approximately 65 consistent with hemorrhage. No rim enhancing fluid collection is identified. IMPRESSION: 1. Skin wound with underlying hematoma in the medial lower leg. Negative for rim enhancing fluid collection to suggest abscess. 2. Infiltration of subcutaneous fat of the lower leg could be due to dependent change or cellulitis. 3. Negative for osteomyelitis or other acute bony or joint abnormality. Electronically Signed   By: Inge Rise M.D.   On: 11/19/2022 14:14   DG Tibia/Fibula Right  Result Date: 11/19/2022 CLINICAL DATA:  Dog bite EXAM: RIGHT TIBIA AND FIBULA - 2 VIEW COMPARISON:  11/11/2022 FINDINGS: No recent fracture or dislocation is seen. There are 2 surgical screws in the distal tibia. There is deformity in the proximal shaft of right fibula suggesting old healed fracture. Smooth marginated calcifications are noted adjacent to the tips of medial and lateral malleoli residual from previous injury. There is soft tissue swelling in the lower leg and around the ankle. There are scattered vascular calcifications. IMPRESSION: No fracture or dislocation is seen. There are no focal lytic lesions. No significant interval changes are noted. Electronically Signed   By: Elmer Picker M.D.   On: 11/19/2022 10:14    EKG: Not performed in the emergency department  Assessment/Plan Principal Problem:   Leg abscess Active Problems:   HIV disease ([place])   Hyperlipidemia   Coronary artery disease involving native coronary artery of native heart with  angina pectoris (HCC)   Chronic combined systolic and diastolic heart failure (HCC)   OSA on CPAP   Dog bite   Leg abscess Dog bite > Patient had a dog bite about a week ago and has worsened despite outpatient Augmentin.  > No fevers nor leukocytosis thus far.  CRP and ESR are mildly elevated. > Orthopedic consulted recommended CT scan showed evidence of abscess.  Plan for I&D. > Received Zosyn in the ED. - Monitor on surgical telemetry floor overnight - Appreciate orthopedics recommendations and assistance with this patient - Appears that I&D will either be this afternoon or tomorrow - Switch antibiotics to vancomycin and Unasyn - Trend fever curve and WBC - Follow-up cultures  HIV > Last labs show undetectable HIV and normal CD4. - Continue home Dovato  Chronic combined systolic and diastolic CHF > Echo in [id number] with EF 35-40%, G1 DD, normal RV function. > Potassium 3.2 in ED, will replete. - Strict I's and O's, daily weights - Replete electrolytes as needed, check magnesium - Trend renal function and electrolytes - Continue home Entresto, metoprolol, Jardiance  CAD > Status post stenting - Continue home Entresto, metoprolol, atorvastatin - Will hold Plavix for now, periprocedurally  Hypertension - Continue home Entresto, metoprolol  Hyperlipidemia - Continue home atorvastatin  OSA - Continue home CPAP  DVT prophylaxis: SCDs for now Code Status:   Full Family Communication:  None on admission  Disposition Plan:   Patient is from:  Home  Anticipated DC to:  Home  Anticipated DC date:  1 to 2 days  Anticipated DC barriers: None  Consults called:  Orthopedic surgery Admission status:  Observation, telemetry  Severity of Illness: The appropriate patient status for this patient is OBSERVATION. Observation status is judged to be reasonable and necessary in order to provide the required intensity of service to ensure the patient's safety. The patient's  presenting  symptoms, physical exam findings, and initial radiographic and laboratory data in the context of their medical condition is felt to place them at decreased risk for further clinical deterioration. Furthermore, it is anticipated that the patient will be medically stable for discharge from the hospital within 2 midnights of admission.    Marcelyn Bruins MD [facility]  How to contact the [facility] Attending or Consulting provider [place] or covering provider during after hours [place], for this patient?   Check the care team in [facility] and look for a) attending/consulting [facility] provider listed and b) the [facility] team listed Log into [email or web address] and use [place]'s universal password to access. If you do not have the password, please contact the hospital operator. Locate the [facility] provider you are looking for under [facility] and page to a number that you can be directly reached. If you still have difficulty reaching the provider, please page the [facility] (Director on Call) for the Hospitalists listed on amion for assistance.  11/19/2022, 2:29 PM

## 2022-11-19 NOTE — Transfer of Care (Signed)
Immediate Anesthesia Transfer of Care Note  Patient: Omar Sandoval  Procedure(s) Performed: IRRIGATION AND DEBRIDEMENT RIGHT LOWER LEG (Right: Leg Lower)  Patient Location: PACU  Anesthesia Type:General  Level of Consciousness: awake  Airway & Oxygen Therapy: Patient Spontanous Breathing and Patient connected to nasal cannula oxygen  Post-op Assessment: Report given to RN and Post -op Vital signs reviewed and stable  Post vital signs: Reviewed and stable  Last Vitals:  Vitals Value Taken Time  BP 95/50 11/19/22 1734  Temp 36.6 C 11/19/22 1734  Pulse 76 11/19/22 1738  Resp 14 11/19/22 1738  SpO2 98 % 11/19/22 1738  Vitals shown include unvalidated device data.  Last Pain:  Vitals:   11/19/22 1513  TempSrc:   PainSc: 0-No pain         Complications: No notable events documented.

## 2022-11-20 ENCOUNTER — Encounter (HOSPITAL_COMMUNITY): Payer: Self-pay | Admitting: Orthopedic Surgery

## 2022-11-20 ENCOUNTER — Other Ambulatory Visit (HOSPITAL_COMMUNITY): Payer: Self-pay

## 2022-11-20 ENCOUNTER — Ambulatory Visit: Payer: Medicare HMO | Admitting: Internal Medicine

## 2022-11-20 DIAGNOSIS — B20 Human immunodeficiency virus [HIV] disease: Secondary | ICD-10-CM | POA: Diagnosis not present

## 2022-11-20 DIAGNOSIS — S81851A Open bite, right lower leg, initial encounter: Secondary | ICD-10-CM | POA: Diagnosis not present

## 2022-11-20 DIAGNOSIS — T148XXA Other injury of unspecified body region, initial encounter: Secondary | ICD-10-CM | POA: Diagnosis not present

## 2022-11-20 DIAGNOSIS — W540XXS Bitten by dog, sequela: Secondary | ICD-10-CM | POA: Diagnosis not present

## 2022-11-20 DIAGNOSIS — L089 Local infection of the skin and subcutaneous tissue, unspecified: Secondary | ICD-10-CM

## 2022-11-20 DIAGNOSIS — I5042 Chronic combined systolic (congestive) and diastolic (congestive) heart failure: Secondary | ICD-10-CM | POA: Diagnosis not present

## 2022-11-20 LAB — CBC
HCT: 30.2 % — ABNORMAL LOW (ref 39.0–52.0)
Hemoglobin: 10.3 g/dL — ABNORMAL LOW (ref 13.0–17.0)
MCH: 34 pg (ref 26.0–34.0)
MCHC: 34.1 g/dL (ref 30.0–36.0)
MCV: 99.7 fL (ref 80.0–100.0)
Platelets: 386 10*3/uL (ref 150–400)
RBC: 3.03 MIL/uL — ABNORMAL LOW (ref 4.22–5.81)
RDW: 13 % (ref 11.5–15.5)
WBC: 3.4 10*3/uL — ABNORMAL LOW (ref 4.0–10.5)
nRBC: 0 % (ref 0.0–0.2)

## 2022-11-20 LAB — COMPREHENSIVE METABOLIC PANEL
ALT: 13 U/L (ref 0–44)
AST: 17 U/L (ref 15–41)
Albumin: 2.6 g/dL — ABNORMAL LOW (ref 3.5–5.0)
Alkaline Phosphatase: 68 U/L (ref 38–126)
Anion gap: 10 (ref 5–15)
BUN: <5 mg/dL — ABNORMAL LOW (ref 8–23)
CO2: 27 mmol/L (ref 22–32)
Calcium: 8.9 mg/dL (ref 8.9–10.3)
Chloride: 97 mmol/L — ABNORMAL LOW (ref 98–111)
Creatinine, Ser: 0.64 mg/dL (ref 0.61–1.24)
GFR, Estimated: >60 mL/min (ref >60)
Glucose, Bld: 178 mg/dL — ABNORMAL HIGH (ref 70–99)
Potassium: 4 mmol/L (ref 3.5–5.1)
Sodium: 134 mmol/L — ABNORMAL LOW (ref 135–145)
Total Bilirubin: 0.3 mg/dL (ref 0.3–1.2)
Total Protein: 5.6 g/dL — ABNORMAL LOW (ref 6.5–8.1)

## 2022-11-20 MED ORDER — DOXYCYCLINE HYCLATE 100 MG PO TABS
100.0000 mg | ORAL_TABLET | Freq: Two times a day (BID) | ORAL | 0 refills | Status: AC
  Filled 2022-11-20: qty 28, 14d supply, fill #0

## 2022-11-20 MED ORDER — AMOXICILLIN-POT CLAVULANATE 875-125 MG PO TABS
1.0000 | ORAL_TABLET | Freq: Two times a day (BID) | ORAL | 0 refills | Status: AC
  Filled 2022-11-20 (×2): qty 28, 14d supply, fill #0

## 2022-11-20 MED ORDER — ATROPINE SULFATE 0.4 MG/ML IV SOLN
INTRAVENOUS | Status: AC
  Filled 2022-11-20: qty 1

## 2022-11-20 MED ORDER — OXYCODONE-ACETAMINOPHEN 5-325 MG PO TABS
1.0000 | ORAL_TABLET | Freq: Two times a day (BID) | ORAL | 0 refills | Status: DC | PRN
  Filled 2022-11-20: qty 30, 8d supply, fill #0

## 2022-11-20 NOTE — Progress Notes (Signed)
Pt tolerating regular diet. Whitman Hero RN,CM

## 2022-11-20 NOTE — Progress Notes (Signed)
Discharge instructions given. Patient verbalized understanding and all questions were answered.  ?

## 2022-11-20 NOTE — Progress Notes (Addendum)
Orthopaedic Trauma Service Progress Note  Patient ID: Omar Sandoval MRN: [id number] DOB/AGE: 09-04-1955 67 y.o.  Subjective:  Doing great No new issues  Wants to go home  Trying to arrange for home vac as we suspect he will need vac to closure Currently has prevena in place which should cover him until time home vac arrives   Labs look good  Afebrile   ROS As above  Objective:   VITALS:   Vitals:   11/19/22 2201 11/19/22 2220 11/20/22 0029 11/20/22 0817  BP: 105/65  90/65 99/72  Pulse: 83 85  86  Resp: 17 18 13 15   Temp:    97.9 F (36.6 C)  TempSrc:    Oral  SpO2:  95%  100%  Weight:      Height:        Estimated body mass index is 27.4 kg/m as calculated from the following:   Height as of this encounter: 5\' 6"  (1.676 m).   Weight as of this encounter: 77 kg.   Intake/Output      03/26 0701 03/27 0700 03/27 0701 03/28 0700   P.O. 200    I.V. (mL/kg) 500 (6.5)    Other 0    IV Piggyback 350    Total Intake(mL/kg) 1050 (13.6)    Urine (mL/kg/hr) 550    Drains 0    Total Output 550    Net +500         Urine Occurrence 1 x      LABS  Results for orders placed or performed during the hospital encounter of 11/19/22 (from the past 24 hour(s))  Comprehensive metabolic panel     Status: Abnormal   Collection Time: 11/19/22 11:10 AM  Result Value Ref Range   Sodium 137 135 - 145 mmol/L   Potassium 3.2 (L) 3.5 - 5.1 mmol/L   Chloride 101 98 - 111 mmol/L   CO2 25 22 - 32 mmol/L   Glucose, Bld 91 70 - 99 mg/dL   BUN <5 (L) 8 - 23 mg/dL   Creatinine, Ser 0.66 0.61 - 1.24 mg/dL   Calcium 8.8 (L) 8.9 - 10.3 mg/dL   Total Protein 6.4 (L) 6.5 - 8.1 g/dL   Albumin 3.0 (L) 3.5 - 5.0 g/dL   AST 17 15 - 41 U/L   ALT 14 0 - 44 U/L   Alkaline Phosphatase 71 38 - 126 U/L   Total Bilirubin 0.8 0.3 - 1.2 mg/dL   GFR, Estimated >60 >60 mL/min   Anion gap 11 5 - 15  Magnesium     Status:  None   Collection Time: 11/19/22 11:17 AM  Result Value Ref Range   Magnesium 1.7 1.7 - 2.4 mg/dL  Surgical pcr screen     Status: None   Collection Time: 11/19/22  3:22 PM   Specimen: Nasal Mucosa; Nasal Swab  Result Value Ref Range   MRSA, PCR NEGATIVE NEGATIVE   Staphylococcus aureus NEGATIVE NEGATIVE  Comprehensive metabolic panel     Status: Abnormal   Collection Time: 11/20/22  2:10 AM  Result Value Ref Range   Sodium 134 (L) 135 - 145 mmol/L   Potassium 4.0 3.5 - 5.1 mmol/L   Chloride 97 (L) 98 - 111 mmol/L   CO2 27 22 - 32 mmol/L   Glucose,  Bld 178 (H) 70 - 99 mg/dL   BUN <5 (L) 8 - 23 mg/dL   Creatinine, Ser 0.64 0.61 - 1.24 mg/dL   Calcium 8.9 8.9 - 10.3 mg/dL   Total Protein 5.6 (L) 6.5 - 8.1 g/dL   Albumin 2.6 (L) 3.5 - 5.0 g/dL   AST 17 15 - 41 U/L   ALT 13 0 - 44 U/L   Alkaline Phosphatase 68 38 - 126 U/L   Total Bilirubin 0.3 0.3 - 1.2 mg/dL   GFR, Estimated >60 >60 mL/min   Anion gap 10 5 - 15  CBC     Status: Abnormal   Collection Time: 11/20/22  2:10 AM  Result Value Ref Range   WBC 3.4 (L) 4.0 - 10.5 K/uL   RBC 3.03 (L) 4.22 - 5.81 MIL/uL   Hemoglobin 10.3 (L) 13.0 - 17.0 g/dL   HCT 30.2 (L) 39.0 - 52.0 %   MCV 99.7 80.0 - 100.0 fL   MCH 34.0 26.0 - 34.0 pg   MCHC 34.1 30.0 - 36.0 g/dL   RDW 13.0 11.5 - 15.5 %   Platelets 386 150 - 400 K/uL   nRBC 0.0 0.0 - 0.2 %     PHYSICAL EXAM:   Gen: resting comfortably in bed, NAD, looks good Ext:       Right Lower Extremity   Prevena working well on hospital unit   Good seal   Scant serosanguinous drainage   Ext warm   Minimal swelling  Motor and sensory functions intact  + DP pulse    I changed hospital unit over to prevena and reviewed how to work the unit  Good seal noted on prevena, no leaks or blocks      Assessment/Plan: 1 Day Post-Op   Principal Problem:   Wound infection, dog bite Active Problems:   HIV disease ([place])   Hyperlipidemia   Coronary artery disease involving native  coronary artery of native heart with angina pectoris ([place])   Chronic combined systolic and diastolic heart failure (HCC)   OSA on CPAP   Dog bite   Anti-infectives (From admission, onward)    Start     Dose/Rate Route Frequency Ordered Stop   11/20/22 1000  dolutegravir-lamiVUDine (DOVATO) 50-300 MG per tablet 1 tablet        1 tablet Oral Daily 11/19/22 1421     11/20/22 0600  ceFAZolin (ANCEF) IVPB 2g/100 mL premix        2 g 200 mL/hr over 30 Minutes Intravenous On call to O.R. 11/19/22 1506 11/19/22 1625   11/20/22 0300  vancomycin (VANCOCIN) IVPB 1000 mg/200 mL premix       See Hyperspace for full Linked Orders Report.   1,000 mg 200 mL/hr over 60 Minutes Intravenous Every 12 hours 11/19/22 1432     11/20/22 0000  amoxicillin-clavulanate (AUGMENTIN) 875-125 MG tablet        1 tablet Oral 2 times daily 11/20/22 0903 12/04/22 2359   11/20/22 0000  doxycycline (VIBRA-TABS) 100 MG tablet        100 mg Oral 2 times daily 11/20/22 0903 12/04/22 2359   11/19/22 1800  Ampicillin-Sulbactam (UNASYN) 3 g in sodium chloride 0.9 % 100 mL IVPB        3 g 200 mL/hr over 30 Minutes Intravenous Every 6 hours 11/19/22 1432     11/19/22 1513  ceFAZolin (ANCEF) 2-4 GM/100ML-% IVPB       Note to Pharmacy: Renda Rolls L: cabinet override  11/19/22 1513 11/19/22 1625   11/19/22 1500  vancomycin (VANCOREADY) IVPB 1750 mg/350 mL       See Hyperspace for full Linked Orders Report.   1,750 mg 175 mL/hr over 120 Minutes Intravenous  Once 11/19/22 1432 11/19/22 1742   11/19/22 0945  piperacillin-tazobactam (ZOSYN) IVPB 3.375 g        3.375 g 100 mL/hr over 30 Minutes Intravenous  Once 11/19/22 0942 11/19/22 1039   11/19/22 0000  doxycycline (VIBRAMYCIN) 100 MG capsule  Status:  Discontinued        100 mg Oral 2 times daily 11/19/22 1137 11/19/22    11/19/22 0000  amoxicillin-clavulanate (AUGMENTIN) 875-125 MG tablet  Status:  Discontinued        1 tablet Oral Every 12 hours 11/19/22 1137  11/19/22    11/19/22 0000  ciprofloxacin (CIPRO) 500 MG tablet  Status:  Discontinued        500 mg Oral Every 12 hours 11/19/22 1256 11/19/22      .  POD/HD#: 1  67 y/o male s/p dog bite 11/11/2022 presented to ED on 11/19/2022 with infection of wound R leg   - infected dog bite R leg s/p I&D, placement of wound vac Weightbearing WBAT R leg with CAM boot. Ok to use crutches   ROM/Activity   Activity as tolerated    Gentle ankle motion as tolerated    Wound care   Do not remove dressing until office follow up    Keep dressings clean and dry    Call office with issues    - Pain management:  Pt in chronic pain management    No additional narcotics needed   - ABL anemia/Hemodynamics  Stable  - Medical issues   Per Medicine  - DVT/PE prophylaxis:  No pharmacologics needed at dc  - ID:   Received vanc and unasyn in hospital  Dc on doxycycline and augmentin x 14 days  - Dispo:  Ok to [employer] today   Follow up with ortho on 11/27/2022 for vac change/wound eval  Continue to work on getting home vac for patient     [place], [place] [phone] (C) 11/20/2022, 10:36 AM  [facility] [place] [address] [phone] Domingo Sep (F)    After 5pm and on the weekends please log on to Amion, go to orthopaedics and the look under the [facility] Call for the provider(s) on call. You can also call our office at [phone] and then follow the prompts to be connected to the call team.  Patient ID: Omar Sandoval, male   DOB: 1956-03-11, 67 y.o.   MRN: [id number]

## 2022-11-20 NOTE — Discharge Summary (Signed)
Physician Discharge Summary   Patient: Omar Sandoval MRN: [id number] DOB: December 23, 1955  Admit date:     11/19/2022  Discharge date: 11/20/22  Discharge Physician: Cordelia Poche, MD   PCP: Eyvonne Mechanic, MD   Recommendations at discharge:  Orthopedic surgery follow-up on 4/3 Continue wound vac dressing until follow-up Antibiotics x2 weeks per orthopedic surgery Follow-up blood cultures  Discharge Diagnoses: Principal Problem:   Wound infection, dog bite Active Problems:   HIV disease ([place])   Hyperlipidemia   Coronary artery disease involving native coronary artery of native heart with angina pectoris ([place])   Chronic combined systolic and diastolic heart failure ([place])   OSA on CPAP   Dog bite  Resolved Problems:   * No resolved hospital problems. *  Hospital Course: DEWYANE ZUG is a 67 y.o. male with a history of HIV, OSA, CAD, hyperlipidemia, hypertension, chronic combined heart failure, alcohol use, marijuana use. Patient presented secondary to ongoing right leg wound after a dog bite with evidence of abscess formation on imaging. Empiric antibiotics started and orthopedic surgery consulted. I&D performed on 3/26 with subsequent wound vac placement. Patient transitioned to oral antibiotics and recommended for outpatient orthopedic surgery follow-up.  Assessment and Plan:  Right leg abscess Right leg infection Secondary to dog bite and worsening infection on antibiotics. 6.6 x 2.5 x 1.4 cm abscess noted on CT imaging. Empiric Vancomycin and Zosyn started on admission and transitioned to Vancomycin and Unasyn. Blood cultures obtained. Orthopedic surgery consulted and performed I&D on 3/26. Blood cultures with no growth to date. Orthopedic surgery recommendations for wound vac on discharge, in addition to doxycycline and Augmentin x14 days. Patient to follow-up with orthopedic surgery on 4/3.  HIV infection Most recent CD4 count of 745/uL on 12/2021 via Care Everywhere documentation.  Patient is managed on Dovato as an outpatient. Continue home regimen.  Chronic combined systolic and diastolic heart failure Stable. Euvolemic. Continue home Entresto, metoprolol and Jardiance.  CAD History of PCI and stent placement. Patient is on Plavix, which was held on admission but resumed on discharge per orthopedic surgery recommendations.  Primary hypertension Continue home Entresto and metoprolol  Hyperlipidemia Continue Lipitor.  OSA Continue CPAP   Consultants: Orthopedic surgery Procedures performed: I&D  Disposition: Home health Diet recommendation: Heart healthy   DISCHARGE MEDICATION: Allergies as of 11/20/2022       Reactions   Chocolate Diarrhea   Lactose Intolerance (gi) Diarrhea, Nausea And Vomiting   Milk (cow) Diarrhea   Vicodin [hydrocodone-acetaminophen] Nausea Only, Other (See Comments)   GI Upset        Medication List     TAKE these medications    amoxicillin-clavulanate 875-125 MG tablet Commonly known as: AUGMENTIN Take 1 tablet by mouth 2 (two) times daily for 14 days. What changed: when to take this   atorvastatin 40 MG tablet Commonly known as: LIPITOR TAKE 1 TABLET BY MOUTH ONCE DAILY AT 6PM .   clopidogrel 75 MG tablet Commonly known as: PLAVIX TAKE 1 TABLET BY MOUTH ONCE DAILY . APPOINTMENT REQUIRED FOR FUTURE REFILLS   dolutegravir-lamiVUDine 50-300 MG tablet Commonly known as: DOVATO Take 1 tablet by mouth daily.   doxycycline 100 MG tablet Commonly known as: VIBRA-TABS Take 1 tablet (100 mg total) by mouth 2 (two) times daily for 14 days.   empagliflozin 10 MG Tabs tablet Commonly known as: Jardiance Take 1 tablet (10 mg total) by mouth daily before breakfast.   Entresto 24-26 MG Generic drug: sacubitril-valsartan Take 1 tablet by  mouth 2 (two) times daily.   metoprolol succinate 25 MG 24 hr tablet Commonly known as: TOPROL-XL Take 1/2 (one-half) tablet by mouth twice daily   nitroGLYCERIN 0.4 MG SL  tablet Commonly known as: NITROSTAT Place 1 tablet (0.4 mg total) under the tongue every 5 (five) minutes x 3 doses as needed for chest pain.   oxyCODONE-acetaminophen 10-325 MG tablet Commonly known as: PERCOCET Take 0.5-1 tablets by mouth every 4 (four) hours as needed for pain.               Durable Medical Equipment  (From admission, onward)           Start     Ordered   11/20/22 0956  For home use only DME Crutches  Once        11/20/22 0955   11/19/22 2046  For home use only DME Negative pressure wound device  Once       Comments: Dressing changes to be done at office  Question Answer Comment  Frequency of dressing change Other see comments   Length of need 3 Months   Dressing type Foam   Amount of suction 125 mm/Hg   Pressure application Continuous pressure   Supplies 10 canisters and 15 dressings per month for duration of therapy      11/19/22 2045              Discharge Care Instructions  (From admission, onward)           Start     Ordered   11/20/22 0000  Leave dressing on - Keep it clean, dry, and intact until clinic visit        11/20/22 1201            Follow-up Information     Altamese [place], MD. Schedule an appointment as soon as possible for a visit on 11/27/2022.   Specialty: Orthopedic Surgery Why: call for appointment time Contact information: [place] [address] [phone]                Discharge Exam: BP 99/72 (BP Location: Left Arm)   Pulse 86   Temp 97.9 F (36.6 C) (Oral)   Resp 15   Ht 5\' 6"  (1.676 m)   Wt 77 kg   SpO2 100%   BMI 27.40 kg/m   General exam: Appears calm and comfortable Respiratory system: Clear to auscultation. Respiratory effort normal. Cardiovascular system: S1 & S2 heard, RRR. Gastrointestinal system: Abdomen is nondistended, soft and nontender. Normal bowel sounds heard. Central nervous system: Alert and oriented. No focal neurological  deficits. Musculoskeletal: Right leg with wound vac and surgical dressing Skin: No cyanosis. No rashes Psychiatry: Judgement and insight appear normal. Mood & affect appropriate.   Condition at discharge: stable  The results of significant diagnostics from this hospitalization (including imaging, microbiology, ancillary and laboratory) are listed below for reference.   Imaging Studies: CT TIBIA FIBULA RIGHT W CONTRAST  Result Date: 11/19/2022 CLINICAL DATA:  Patient status post dog bite 10 days ago. Right lower leg pain and swelling. EXAM: CT OF THE LOWER RIGHT EXTREMITY WITH CONTRAST TECHNIQUE: Multidetector CT imaging of the lower right extremity was performed according to the standard protocol following intravenous contrast administration. RADIATION DOSE REDUCTION: This exam was performed according to the departmental dose-optimization program which includes automated exposure control, adjustment of the mA and/or kV according to patient size and/or use of iterative reconstruction technique. CONTRAST:  75 mL OMNIPAQUE IOHEXOL  350 MG/ML SOLN COMPARISON:  Plain films right tib fib today. FINDINGS: Bones/Joint/Cartilage No acute bony or joint abnormality is seen. No bony destructive change or periosteal reaction. Screw fragments in the distal tibia are noted. There is some ossification of the syndesmosis of the distal tibia and fibula. Ligaments Suboptimally assessed by CT. Muscles and Tendons No intramuscular fluid collection or gas.  Intact. Soft tissues There is infiltration of subcutaneous fat about the lower leg. A focus of gas along the superficial fascia of the medial gastrocnemius is subjacent to a skin wound. Fluid deep to the wound measures approximately 6.6 cm craniocaudal by 2.5 cm AP by 1.4 cm transverse. It has Hounsfield units of approximately 65 consistent with hemorrhage. No rim enhancing fluid collection is identified. IMPRESSION: 1. Skin wound with underlying hematoma in the medial  lower leg. Negative for rim enhancing fluid collection to suggest abscess. 2. Infiltration of subcutaneous fat of the lower leg could be due to dependent change or cellulitis. 3. Negative for osteomyelitis or other acute bony or joint abnormality. Electronically Signed   By: Inge Rise M.D.   On: 11/19/2022 14:14   DG Tibia/Fibula Right  Result Date: 11/19/2022 CLINICAL DATA:  Dog bite EXAM: RIGHT TIBIA AND FIBULA - 2 VIEW COMPARISON:  11/11/2022 FINDINGS: No recent fracture or dislocation is seen. There are 2 surgical screws in the distal tibia. There is deformity in the proximal shaft of right fibula suggesting old healed fracture. Smooth marginated calcifications are noted adjacent to the tips of medial and lateral malleoli residual from previous injury. There is soft tissue swelling in the lower leg and around the ankle. There are scattered vascular calcifications. IMPRESSION: No fracture or dislocation is seen. There are no focal lytic lesions. No significant interval changes are noted. Electronically Signed   By: Elmer Picker M.D.   On: 11/19/2022 10:14   DG Tibia/Fibula Right  Result Date: 11/11/2022 CLINICAL DATA:  Dog bite.  Pain EXAM: RIGHT TIBIA AND FIBULA - 2 VIEW COMPARISON:  None Available. FINDINGS: Osteopenia. Healed fracture of the proximal fibular neck with mature callus. Is also deformity distally with 2 screws along the distal tibia. Please correlate with history. No underlying acute fracture or dislocation. Soft tissue swelling. No radiopaque foreign body. Bandage medially along the lower leg. IMPRESSION: Changes of remote injury with healed fractures and ORIF of the distal tibia. Osteopenia Electronically Signed   By: Jill Side M.D.   On: 11/11/2022 14:57   CT Renal Stone Study  Result Date: 11/02/2022 CLINICAL DATA:  Two day history of left flank pain EXAM: CT ABDOMEN AND PELVIS WITHOUT CONTRAST TECHNIQUE: Multidetector CT imaging of the abdomen and pelvis was  performed following the standard protocol without IV contrast. RADIATION DOSE REDUCTION: This exam was performed according to the departmental dose-optimization program which includes automated exposure control, adjustment of the mA and/or kV according to patient size and/or use of iterative reconstruction technique. COMPARISON:  Remote prior CT scan of the abdomen and pelvis 03/28/2009 FINDINGS: Lower chest: No acute abnormality. Hepatobiliary: Normal hepatic contour and morphology. No discrete hepatic lesion. Gallbladder is unremarkable. No intra or extrahepatic biliary ductal dilatation. Pancreas: Unremarkable. No pancreatic ductal dilatation or surrounding inflammatory changes. Spleen: Normal in size without focal abnormality. Adrenals/Urinary Tract: Adrenal glands are unremarkable. Kidneys are normal, without renal calculi, focal lesion, or hydronephrosis. Bladder is unremarkable. Stomach/Bowel: No evidence of obstruction or focal bowel wall thickening. Normal appendix in the right lower quadrant. The terminal ileum is unremarkable. Vascular/Lymphatic: Limited  evaluation in the absence of intravenous contrast. 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 The aneurysm is relatively long in length. This is new compared to prior imaging from 20/10. Evaluation is limited in the absence of intravenous contrast. Scattered plaque throughout the abdominal aorta and branch vessels. No suspicious lymphadenopathy. Reproductive: Prostate is unremarkable. Other: Omental fat containing umbilical hernia. Musculoskeletal: Chronic T12 compression fracture with approximately 50% height loss. No acute fracture or malalignment. Bilateral lower lumbar facet arthropathy. IMPRESSION: 1. No evidence of nephrolithiasis or ureteral stones. 2. 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 Evaluation is limited in the absence of intravenous  contrast. Consider further evaluation with CT arteriogram of the abdomen and pelvis. 3. Chronic T12 compression fracture with approximately 50% height loss. Electronically Signed   By: Jacqulynn Cadet M.D.   On: 11/02/2022 14:12    Microbiology: Results for orders placed or performed during the hospital encounter of 11/19/22  Blood culture (routine x 2)     Status: None (Preliminary result)   Collection Time: 11/19/22  9:32 AM   Specimen: BLOOD  Result Value Ref Range Status   Specimen Description BLOOD RIGHT ANTECUBITAL  Final   Special Requests   Final    BOTTLES DRAWN AEROBIC AND ANAEROBIC Blood Culture results may not be optimal due to an inadequate volume of blood received in culture bottles   Culture   Final    NO GROWTH < 24 HOURS Performed at [facility] [address] [place], [place] [address]    Report Status PENDING  Incomplete  Blood culture (routine x 2)     Status: None (Preliminary result)   Collection Time: 11/19/22 10:02 AM   Specimen: BLOOD  Result Value Ref Range Status   Specimen Description BLOOD SITE NOT SPECIFIED  Final   Special Requests   Final    BOTTLES DRAWN AEROBIC AND ANAEROBIC Blood Culture results may not be optimal due to an inadequate volume of blood received in culture bottles   Culture   Final    NO GROWTH < 24 HOURS Performed at [facility], [place] [address], [place], [place] [address]    Report Status PENDING  Incomplete  Surgical pcr screen     Status: None   Collection Time: 11/19/22  3:22 PM   Specimen: Nasal Mucosa; Nasal Swab  Result Value Ref Range Status   MRSA, PCR NEGATIVE NEGATIVE Final   Staphylococcus aureus NEGATIVE NEGATIVE Final    Comment: (NOTE) The Xpert SA Assay (FDA approved for NASAL specimens in patients 24 years of age and older), is one component of a comprehensive surveillance program. It is not intended to diagnose infection nor to guide or monitor treatment. Performed at [facility],  [place] [address] [place], [place] [address]     Labs: CBC: Recent Labs  Lab 11/18/22 1740 11/19/22 0932 11/20/22 0210  WBC 5.1 5.1 3.4*  NEUTROABS 2.7 3.3  --   HGB 10.8* 10.7* 10.3*  HCT 31.6* 32.1* 30.2*  MCV 100.0 102.2* 99.7  PLT 385 401* [id number]   Basic Metabolic Panel: Recent Labs  Lab 11/18/22 1740 11/19/22 1110 11/19/22 1117 11/20/22 0210  NA 135 137  --  134*  K 3.1* 3.2*  --  4.0  CL 95* 101  --  97*  CO2 28 25  --  27  GLUCOSE 162* 91  --  178*  BUN <5* <5*  --  <5*  CREATININE 0.79 0.66  --  0.64  CALCIUM 9.1 8.8*  --  8.9  MG  --   --  1.7  --    Liver Function Tests: Recent Labs  Lab 11/19/22 1110 11/20/22 0210  AST 17 17  ALT 14 13  ALKPHOS 71 68  BILITOT 0.8 0.3  PROT 6.4* 5.6*  ALBUMIN 3.0* 2.6*    Discharge time spent: 35 minutes.  Signed: Cordelia Poche, MD [facility] 11/20/2022

## 2022-11-20 NOTE — TOC Initial Note (Signed)
Transition of Care [facility]) - Initial/Assessment Note    Patient Details  Name: Omar Sandoval MRN: [id number] Date of Birth: 12-02-1955  Transition of [facility]) CM/SW Contact:    Sharin Mons, RN Phone Number: 11/20/2022, 8:21 AM  Clinical Narrative:        Right leg dog bite complicated by infection.                  - s/p  Debridement of skin, application of biologic graft, application of wound VAC 3/26 From home alone. States  [name] (ex. Roommate) will assist with care once discharge. Per provider pt will d/c with Prevena vac and change to [facility]/VAC [name]. pending). Referral made with [name] ( [email or web address]) for wound vac, acceptance pending. Referral made with [name] with [place] for home health RN services and accepted [facility] Fri or Sat.            Per provider: "no vac changes are needed. we will change when he comes to the office. I just need the machine hooked up to the sponge as soon as he gets the home vac.We will determine at first office follow up if we are going to continue with vac changes ."  11/20/2022  1000 -  Call received from [name]. [name] informed NCM they are unable to provide wound vac 2/2 out of network. Referral then made with [name] with [employer] . [name] to email forms to [facility] for MD/ provider to complete, authorization pending for wound vac.    Patient Goals and CMS Choice     Choice offered to / list presented to : Patient      Expected Discharge Plan and Services   Discharge Planning Services: CM Consult                     DME Arranged: Vac DME Agency: [facility] Date DME Agency Contacted: 11/20/22 Time DME Agency Contacted: [phone] Representative spoke with at DME Agency: [name] ( voice message left HH Arranged: RN [place] Agency: [place] Date [place]: 11/20/22 Time [place]: [phone] Representative spoke with at [place]: [name]  Prior Living Arrangements/Services                       Activities  of Daily Living Home Assistive Devices/Equipment: None ADL Screening (condition at time of admission) Patient's cognitive ability adequate to safely complete daily activities?: Yes Is the patient deaf or have difficulty hearing?: No Does the patient have difficulty seeing, even when wearing glasses/contacts?: No Does the patient have difficulty concentrating, remembering, or making decisions?: No Patient able to express need for assistance with ADLs?: No Does the patient have difficulty dressing or bathing?: No Independently performs ADLs?: Yes (appropriate for developmental age) Does the patient have difficulty walking or climbing stairs?: No Weakness of Legs: Right (d/t injury) Weakness of Arms/Hands: None  Permission Sought/Granted                  Emotional Assessment              Admission diagnosis:  Wound infection [T14.8XXA, L08.9] Rabies exposure [Z20.3] Leg abscess [L02.419] Patient Active Problem List   Diagnosis Date Noted   Leg abscess 11/19/2022   Dog bite 11/19/2022   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   Laryngopharyngeal reflux (LPR) 04/24/2021   Essential hypertension 02/08/2019   OSA on CPAP 02/08/2019   Cardiomyopathy, ischemic 02/08/2019   Alcohol abuse  01/19/2018   Cannabis abuse 01/19/2018   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   Left inguinal hernia 07/14/2017   Chronic combined systolic and diastolic heart failure ([place]) 02/28/2017   Hyperlipidemia 02/06/2017   Coronary artery disease involving native coronary artery of native heart with angina pectoris ([place]) 02/06/2017   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 02/2017 02/03/2017   HIV disease ([place]) 01/06/2013   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   PCP:  Eyvonne Mechanic, MD Pharmacy:   [facility] ([place]), [place] - [address] [place] ([place]) [place] [address] Phone: [phone] Fax:  [phone]  [id number] - [place], [place] [id number] [place] [id number] Phone: [phone] Fax: [phone]     Social Determinants of Health (SDOH) Social History: [place]: No Food Insecurity (11/19/2022)  Housing: Low Risk  (11/19/2022)  Transportation Needs: No Transportation Needs (11/19/2022)  Utilities: Not At Risk (11/19/2022)  Tobacco Use: Medium Risk (11/19/2022)   SDOH Interventions: Housing Interventions: Intervention Not Indicated   Readmission Risk Interventions     No data to display

## 2022-11-20 NOTE — TOC Transition Note (Signed)
Transition of Care [facility]) - CM/SW Discharge Note   Patient Details  Name: Omar Sandoval MRN: [id number] Date of Birth: August 11, 1956  [facility]) CM/SW Contact:  Sharin Mons, RN Phone Number: 11/20/2022, 2:21 PM   Clinical Narrative:    Patient will DC to: Home  Anticipated DC date: 11/20/2022 Family notified: yes, Friend [facility]) Transport by: car  Late entry 1230 Per MD patient ready for DC today. RN, patient, patient's family, and [facility] notified of DC. Pt will discharge with Prevena wound vac from hospital. Authorization for regular wound vac received from [employer]. Equipment will be delivered to pt's home tonight. Alvis Lemmings  [facility] start of care , Friday 3/29, provider made aware.   [facility] pharmacy delivered RX meds to bedside. Post hospital f/u noted on AVS. Pt's friend Shalif to provide transportation to home. RNCM will sign off for now as intervention is no longer needed. Please consult [place] again if new needs arise.    Final next level of care: [place] Barriers to Discharge: No Barriers Identified   Patient Goals and CMS Choice   Choice offered to / list presented to : Patient  Discharge Placement                         Discharge Plan and Services Additional resources added to the After Visit Summary for     Discharge Planning Services: CM Consult            DME Arranged: Vac DME Agency: [facility] Date DME Agency Contacted: 11/20/22 Time DME Agency Contacted: [phone] Representative spoke with at DME Agency: [name] ( voice message left HH Arranged: RN [place] Agency: [place] Date [facility] Agency Contacted: 11/20/22 Time [place]: [phone] Representative spoke with at [place]: [place] Determinants of Health ([place]) Interventions SDOH Screenings   Food Insecurity: No Food Insecurity (11/19/2022)  Housing: Low Risk  (11/19/2022)  Transportation Needs: No Transportation Needs (11/19/2022)  Utilities: Not At Risk  (11/19/2022)  Tobacco Use: Medium Risk (11/20/2022)     Readmission Risk Interventions     No data to display

## 2022-11-20 NOTE — Evaluation (Signed)
Physical Therapy Evaluation Patient Details Name: Omar Sandoval MRN: [id number] DOB: [birth year] Today's Date: 11/20/2022  History of Present Illness  Pt is a 67yo male who came to ED with on going R leg wound. Pt now s/p  Debridement of skin, application of biologic graft, application of wound VAC 3/26. PMH: OSA, CAD status post stent, hyperlipidemia, HIV, hypertension, chronic combined systolic and diastolic CHF, alcohol use, marijuana use, LPR   Clinical Impression  Pt admitted with above. Pt completed gait training with crutches and stair negotiation with crutches with good return demonstration. Pt to wear R CAM boot for protection. Educated on keeping it elevated when at rest and to minimize pro-longed standing. Pt denies pain at this time. Acute PT to cont to follow.       Recommendations for follow up therapy are one component of a multi-disciplinary discharge planning process, led by the attending physician.  Recommendations may be updated based on patient status, additional functional criteria and insurance authorization.  Follow Up Recommendations       Assistance Recommended at Discharge PRN  Patient can return home with the following       Equipment Recommendations Crutches  Recommendations for Other Services       Functional Status Assessment Patient has had a recent decline in their functional status and demonstrates the ability to make significant improvements in function in a reasonable and predictable amount of time.     Precautions / Restrictions Precautions Precautions: Fall Precaution Comments: wound vac to R calf Restrictions Weight Bearing Restrictions: Yes RLE Weight Bearing: Weight bearing as tolerated Other Position/Activity Restrictions: CAM boot for R LE      Mobility  Bed Mobility Overal bed mobility: Needs Assistance Bed Mobility: Supine to Sit, Sit to Supine     Supine to sit: Modified independent (Device/Increase time) Sit to supine: Modified  independent (Device/Increase time)   General bed mobility comments: HOB elevated, no physical assist neded    Transfers Overall transfer level: Needs assistance Equipment used: Crutches Transfers: Sit to/from Stand Sit to Stand: Supervision           General transfer comment: supervision for safety, verbal cues for safe crutch management, no physical assist    Ambulation/Gait Ambulation/Gait assistance: Supervision Gait Distance (Feet): 150 Feet Assistive device: Crutches Gait Pattern/deviations: Step-to pattern, Decreased step length - left, Decreased stance time - right, Decreased weight shift to right Gait velocity: dec compared to baseline Gait velocity interpretation: <1.31 ft/sec, indicative of household ambulator   General Gait Details: verbal cues initiall for sequencing crutches with R foot, pt unable to complete fluid/reciprocal gait pattern, pt efficient with step to pattern  Stairs Stairs: Yes Stairs assistance: Min guard Stair Management: One rail Right, Forwards, With crutches Number of Stairs: 2 (x2) General stair comments: completed stair negotiation with L hand rail and crutches in R hand and then with bilat crutches, sequencing "up with the good (L), down with the bad (R)." pt with good return demonstration  Wheelchair Mobility    Modified Rankin (Stroke Patients Only)       Balance Overall balance assessment: Needs assistance Sitting-balance support: Feet supported, No upper extremity supported Sitting balance-Leahy Scale: Good     Standing balance support: Single extremity supported, During functional activity, Reliant on assistive device for balance Standing balance-Leahy Scale: Fair Standing balance comment: benefits from crutches  Pertinent Vitals/Pain Pain Assessment Pain Assessment: No/denies pain    Home Living Family/patient expects to be discharged to:: Private residence Living Arrangements:  Alone Available Help at Discharge: Friend(s);Available 24 hours/day Type of Home: House Home Access: Stairs to enter Entrance Stairs-Rails: Can reach both Entrance Stairs-Number of Steps: 5   Home Layout: One level Home Equipment: None      Prior Function Prior Level of Function : Independent/Modified Independent;Working/employed;Driving             Mobility Comments: works as a Programme researcher, broadcasting/film/video as Hydrologist ADLs Comments: indep     Journalist, newspaper   Dominant Hand: Right    Extremity/Trunk Assessment   Upper Extremity Assessment Upper Extremity Assessment: Overall WFL for tasks assessed    Lower Extremity Assessment Lower Extremity Assessment: RLE deficits/detail RLE Deficits / Details: limited ankle ROM due to wound vac and calf wound    Cervical / Trunk Assessment Cervical / Trunk Assessment: Normal  Communication   Communication: No difficulties  Cognition Arousal/Alertness: Awake/alert Behavior During Therapy: WFL for tasks assessed/performed Overall Cognitive Status: Within Functional Limits for tasks assessed                                          General Comments General comments (skin integrity, edema, etc.): R LE with ace wrap around wound and wound vac    Exercises     Assessment/Plan    PT Assessment Patient needs continued PT services  PT Problem List Decreased strength;Decreased balance;Decreased activity tolerance;Decreased mobility;Decreased knowledge of use of DME       PT Treatment Interventions Gait training;DME instruction;Stair training;Functional mobility training;Therapeutic activities;Therapeutic exercise    PT Goals (Current goals can be found in the Care Plan section)  Acute Rehab PT Goals Patient Stated Goal: home PT Goal Formulation: With patient Time For Goal Achievement: 12/03/22 Potential to Achieve Goals: Good    Frequency Min 3X/week     Co-evaluation               AM-PAC PT "6 Clicks" Mobility   Outcome Measure Help needed turning from your back to your side while in a flat bed without using bedrails?: None Help needed moving from lying on your back to sitting on the side of a flat bed without using bedrails?: None Help needed moving to and from a bed to a chair (including a wheelchair)?: None Help needed standing up from a chair using your arms (e.g., wheelchair or bedside chair)?: A Little Help needed to walk in hospital room?: A Little Help needed climbing 3-5 steps with a railing? : A Little 6 Click Score: 21    End of Session Equipment Utilized During Treatment: Gait belt Activity Tolerance: Patient tolerated treatment well Patient left: in bed;with call bell/phone within reach Nurse Communication: Mobility status PT Visit Diagnosis: Unsteadiness on feet (R26.81);Difficulty in walking, not elsewhere classified (R26.2)    Time: [id number] PT Time Calculation (min) (ACUTE ONLY): 29 min   Charges:   PT Evaluation $PT Eval Moderate Complexity: 1 Mod PT Treatments $Gait Training: 8-22 mins        Kittie Plater, PT, DPT Acute Rehabilitation Services Secure chat preferred Office #: [phone]   Berline Lopes 11/20/2022, 10:51 AM

## 2022-11-20 NOTE — Anesthesia Postprocedure Evaluation (Signed)
Anesthesia Post Note  Patient: Omar Sandoval  Procedure(s) Performed: IRRIGATION AND DEBRIDEMENT RIGHT LOWER LEG (Right: Leg Lower)     Patient location during evaluation: PACU Anesthesia Type: General Level of consciousness: awake and alert Pain management: pain level controlled Vital Signs Assessment: post-procedure vital signs reviewed and stable Respiratory status: spontaneous breathing, nonlabored ventilation and respiratory function stable Cardiovascular status: stable and blood pressure returned to baseline Anesthetic complications: no   No notable events documented.  Last Vitals:  Vitals:   11/20/22 0029 11/20/22 0817  BP: 90/65 99/72  Pulse:  86  Resp: 13 15  Temp:  36.6 C  SpO2:  100%    Last Pain:  Vitals:   11/20/22 1100  TempSrc:   PainSc: 0-No pain                 Audry Pili

## 2022-11-20 NOTE — Progress Notes (Signed)
Orthopedic Tech Progress Note Patient Details:  Omar Sandoval Jan 08, 1956 [id number] Medium CAM Gilford Rile and 5'6'' crutches were delivered to the patient's room.  Ortho Devices Type of Ortho Device: CAM walker, Crutches Ortho Device/Splint Location: RLE Ortho Device/Splint Interventions: Ordered, Adjustment      Kynzlie Hilleary E Leyla Soliz 11/20/2022, 10:53 AM

## 2022-11-20 NOTE — Hospital Course (Signed)
Omar Sandoval is a 67 y.o. male with a history of HIV, OSA, CAD, hyperlipidemia, hypertension, chronic combined heart failure, alcohol use, marijuana use. Patient presented secondary to ongoing right leg wound after a dog bite with evidence of abscess formation on imaging. Empiric antibiotics started and orthopedic surgery consulted. I&D performed on 3/26 with subsequent wound vac placement. Patient transitioned to oral antibiotics and recommended for outpatient orthopedic surgery follow-up.

## 2022-11-23 LAB — CULTURE, BLOOD (ROUTINE X 2)
Culture: NO GROWTH
Special Requests: ADEQUATE

## 2022-11-24 LAB — CULTURE, BLOOD (ROUTINE X 2)
Culture: NO GROWTH
Culture: NO GROWTH

## 2022-11-27 ENCOUNTER — Ambulatory Visit: Payer: Medicare HMO | Attending: Internal Medicine | Admitting: Internal Medicine

## 2022-11-27 ENCOUNTER — Ambulatory Visit (INDEPENDENT_AMBULATORY_CARE_PROVIDER_SITE_OTHER): Payer: Medicare HMO

## 2022-11-27 ENCOUNTER — Encounter: Payer: Self-pay | Admitting: Internal Medicine

## 2022-11-27 VITALS — BP 110/60 | HR 74 | Ht 67.0 in | Wt 164.6 lb

## 2022-11-27 DIAGNOSIS — I10 Essential (primary) hypertension: Secondary | ICD-10-CM

## 2022-11-27 DIAGNOSIS — G4733 Obstructive sleep apnea (adult) (pediatric): Secondary | ICD-10-CM | POA: Diagnosis not present

## 2022-11-27 DIAGNOSIS — I25119 Atherosclerotic heart disease of native coronary artery with unspecified angina pectoris: Secondary | ICD-10-CM

## 2022-11-27 DIAGNOSIS — I493 Ventricular premature depolarization: Secondary | ICD-10-CM | POA: Diagnosis not present

## 2022-11-27 DIAGNOSIS — B20 Human immunodeficiency virus [HIV] disease: Secondary | ICD-10-CM

## 2022-11-27 DIAGNOSIS — Z720 Tobacco use: Secondary | ICD-10-CM | POA: Insufficient documentation

## 2022-11-27 DIAGNOSIS — E785 Hyperlipidemia, unspecified: Secondary | ICD-10-CM

## 2022-11-27 DIAGNOSIS — I5020 Unspecified systolic (congestive) heart failure: Secondary | ICD-10-CM | POA: Diagnosis not present

## 2022-11-27 DIAGNOSIS — I77819 Aortic ectasia, unspecified site: Secondary | ICD-10-CM | POA: Insufficient documentation

## 2022-11-27 MED ORDER — NITROGLYCERIN 0.4 MG SL SUBL: 0.4000 mg | SUBLINGUAL_TABLET | SUBLINGUAL | 3 refills | Status: DC | PRN

## 2022-11-27 NOTE — Patient Instructions (Addendum)
Medication Instructions:  Your physician recommends that you continue on your current medications as directed. Please refer to the Current Medication list given to you today.  *If you need a refill on your cardiac medications before your next appointment, please call your pharmacy*   Lab Work: NONE If you have labs (blood work) drawn today and your tests are completely normal, you will receive your results only by: [place] (if you have MyChart) OR A paper copy in the mail If you have any lab test that is abnormal or we need to change your treatment, we will call you to review the results.   Testing/Procedures: SEPT 2024: Your physician has requested that you have an echocardiogram. Echocardiography is a painless test that uses sound waves to create images of your heart. It provides your doctor with information about the size and shape of your heart and how well your heart's chambers and valves are working. This procedure takes approximately one hour. There are no restrictions for this procedure. Please do NOT wear cologne, perfume, aftershave, or lotions (deodorant is allowed). Please arrive 15 minutes prior to your appointment time.  Your physician has requested that you wear a heart monitor.    Follow-Up: At [facility], you and your health needs are our priority.  As part of our continuing mission to provide you with exceptional heart care, we have created designated Provider Care Teams.  These Care Teams include your primary Cardiologist (physician) and Advanced Practice Providers (APPs -  Physician Assistants and Nurse Practitioners) who all work together to provide you with the care you need, when you need it.  We recommend signing up for the patient portal called "MyChart".  Sign up information is provided on this After Visit Summary.  MyChart is used to connect with patients for Virtual Visits (Telemedicine).  Patients are able to view lab/test results, encounter  notes, upcoming appointments, etc.  Non-urgent messages can be sent to your provider as well.   To learn more about what you can do with MyChart, go to [email or web address].    Your next appointment:   Sept 2024 after Echo  Provider:   Rudean Haskell, MD or Ambrose Pancoast, NP, or Nicholes Rough, PSA        Other Instructions Bryn Gulling- Long Term Monitor Instructions  Your physician has requested you wear a ZIO patch monitor for 7 days.  This is a single patch monitor. [employer] supplies one patch monitor per enrollment. Additional stickers are not available. Please do not apply patch if you will be having a Nuclear Stress Test,   Cardiac CT, MRI, or Chest Xray during the period you would be wearing the  monitor. The patch cannot be worn during these tests. You cannot remove and re-apply the  ZIO XT patch monitor.  Your ZIO patch monitor will be mailed 3 day USPS to your address on file. It may take 3-5 days  to receive your monitor after you have been enrolled.  Once you have received your monitor, please review the enclosed instructions. Your monitor  has already been registered assigning a specific monitor serial # to you.  Billing and Patient Assistance Program Information  We have supplied [employer] with any of your insurance information on file for billing purposes. [employer] offers a sliding scale Patient Assistance Program for patients that do not have  insurance, or whose insurance does not completely cover the cost of the ZIO monitor.  You must apply for the Patient Assistance Program to qualify  for this discounted rate.  To apply, please call [employer] at [phone], select option 4, select option 2, ask to apply for  Patient Assistance Program. Theodore Demark will ask your household income, and how many people  are in your household. They will quote your out-of-pocket cost based on that information.  [employer] will also be able to set up a 84-month, interest-free payment plan if  needed.  Applying the monitor   Shave hair from upper left chest.  Hold abrader disc by orange tab. Rub abrader in 40 strokes over the upper left chest as  indicated in your monitor instructions.  Clean area with 4 enclosed alcohol pads. Let dry.  Apply patch as indicated in monitor instructions. Patch will be placed under collarbone on left  side of chest with arrow pointing upward.  Rub patch adhesive wings for 2 minutes. Remove white label marked "1". Remove the white  label marked "2". Rub patch adhesive wings for 2 additional minutes.  While looking in a mirror, press and release button in center of patch. A small green light will  flash 3-4 times. This will be your only indicator that the monitor has been turned on.  Do not shower for the first 24 hours. You may shower after the first 24 hours.  Press the button if you feel a symptom. You will hear a small click. Record Date, Time and  Symptom in the Patient Logbook.  When you are ready to remove the patch, follow instructions on the last 2 pages of Patient  Logbook. Stick patch monitor onto the last page of Patient Logbook.  Place Patient Logbook in the blue and white box. Use locking tab on box and tape box closed  securely. The blue and white box has prepaid postage on it. Please place it in the mailbox as  soon as possible. Your physician should have your test results approximately 7 days after the  monitor has been mailed back to [facility].  Call [place] at [phone] if you have questions regarding  your ZIO XT patch monitor. Call them immediately if you see an orange light blinking on your  monitor.  If your monitor falls off in less than 4 days, contact our Monitor department at [phone].  If your monitor becomes loose or falls off after 4 days call [employer] at [phone] for  suggestions on securing your monitor

## 2022-11-27 NOTE — Progress Notes (Signed)
Cardiology Office Note:    Date:  11/27/2022   ID:  Omar Sandoval, DOB [birth year], MRN [id number]  PCP:  Eyvonne Mechanic, MD  Cardiologist:  Omar Grooms, MD (Inactive)   Referring MD: Eyvonne Mechanic, MD   CC: Transition to new cardiologist  History of Present Illness:    Omar Sandoval is a 67 y.o. male with a hx of HIV, hypertension, HLD, PVCs, chronic combined systolic and diastolic CHF.  Also has CAD status post inferior STEMI 01/2017 treated with DES to the RCA with residual 70% mid LAD and 60 to 70% distal circumflex EF 40 to 50% at that time.  Echo after cath 04/2022 EF 35 to 40%.  Cath revealed eccentric LAD disease, medically managed. 2024: Transitioned from Dr. Tamala Sandoval  Had dog bite and his getting ABX for his leg.  Hoping to get his wound vac off 11/27/22.  Patient notes that he is doing OK.   Since last visit notes no further chest pain . Has feeling skipping heart beats.  No SOB with exertion Seen a hepatologist in [facility] because of hepatomegaly.  Only drank a 40 ounce yesterday.  Is trying to cut back.  Smokes a "fair amount of weed" ~ 3-4 blunts a day since 14. No weight gain or leg swelling on his left leg (dog bit his right leg).   Past Medical History:  Diagnosis Date   HIV (human immunodeficiency virus infection)    Immune deficiency disorder    Inguinal hernia    Left   Myocardial infarction    01-2017 angioplasty with stents    Past Surgical History:  Procedure Laterality Date   COLONOSCOPY     CORONARY ANGIOPLASTY     CORONARY/GRAFT ACUTE MI REVASCULARIZATION N/A 02/03/2017   Procedure: Coronary/Graft Acute MI Revascularization;  Surgeon: Belva Crome, MD;  Location: [place] [facility];  Service: Cardiovascular;  Laterality: N/A;   FRACTURE SURGERY     left ankle and right foot   HERNIA REPAIR     I & D EXTREMITY Right 11/19/2022   Procedure: IRRIGATION AND DEBRIDEMENT RIGHT LOWER LEG;  Surgeon: Altamese Romeville, MD;  Location: [place];  Service:  Orthopedics;  Laterality: Right;   INGUINAL HERNIA REPAIR Left 08/27/2017   Procedure: LAPAROSCOPIC LEFT INGUINAL HERNIA REPAIR WITH MESH;  Surgeon: Ralene Ok, MD;  Location: [place];  Service: General;  Laterality: Left;   INSERTION OF MESH Left 08/27/2017   Procedure: INSERTION OF MESH;  Surgeon: Ralene Ok, MD;  Location: [place];  Service: General;  Laterality: Left;   LEFT HEART CATH AND CORONARY ANGIOGRAPHY N/A 02/03/2017   Procedure: Left Heart Cath and Coronary Angiography;  Surgeon: Belva Crome, MD;  Location: [place] [facility];  Service: Cardiovascular;  Laterality: N/A;   LEFT HEART CATH AND CORONARY ANGIOGRAPHY N/A 09/25/2018   Procedure: LEFT HEART CATH AND CORONARY ANGIOGRAPHY;  Surgeon: Belva Crome, MD;  Location: [place] [facility];  Service: Cardiovascular;  Laterality: N/A;   OPEN REDUCTION INTERNAL FIXATION (ORIF) DISTAL RADIAL FRACTURE Right 07/20/2016   Procedure: OPEN REDUCTION INTERNAL FIXATION (ORIF) DISTAL RADIAL FRACTURE;  Surgeon: Iran Planas, MD;  Location: [place];  Service: Orthopedics;  Laterality: Right;   RIGHT/LEFT HEART CATH AND CORONARY ANGIOGRAPHY N/A 08/02/2022   Procedure: RIGHT/LEFT HEART CATH AND CORONARY ANGIOGRAPHY;  Surgeon: Belva Crome, MD;  Location: [place] [facility];  Service: Cardiovascular;  Laterality: N/A;   SURGERY SCROTAL / TESTICULAR  nondecended testes    Current Medications: Current Meds  Medication Sig   amoxicillin-clavulanate (AUGMENTIN) 875-125 MG tablet Take 1 tablet by mouth 2 (two) times daily for 14 days.   aspirin EC 81 MG tablet Take by mouth.   atorvastatin (LIPITOR) 40 MG tablet TAKE 1 TABLET BY MOUTH ONCE DAILY AT 6PM .   clopidogrel (PLAVIX) 75 MG tablet TAKE 1 TABLET BY MOUTH ONCE DAILY . APPOINTMENT REQUIRED FOR FUTURE REFILLS   Dolutegravir-lamiVUDine 50-300 MG TABS Take 1 tablet by mouth daily.    doxycycline (VIBRA-TABS) 100 MG tablet Take 1 tablet (100 mg total) by mouth 2 (two) times daily for 14  days.   empagliflozin (JARDIANCE) 10 MG TABS tablet Take 1 tablet (10 mg total) by mouth daily before breakfast.   metoprolol succinate (TOPROL-XL) 25 MG 24 hr tablet Take 1/2 (one-half) tablet by mouth twice daily (Patient taking differently: 25 mg daily.)   oxyCODONE-acetaminophen (PERCOCET) 10-325 MG tablet Take 0.5-1 tablets by mouth every 4 (four) hours as needed for pain.   sacubitril-valsartan (ENTRESTO) 24-26 MG Take 1 tablet by mouth 2 (two) times daily. (Patient taking differently: Take 1 tablet by mouth daily.)   [DISCONTINUED] nitroGLYCERIN (NITROSTAT) 0.4 MG SL tablet Place 1 tablet (0.4 mg total) under the tongue every 5 (five) minutes x 3 doses as needed for chest pain.     Allergies:   Chocolate, Lactose intolerance (gi), Milk (cow), and Vicodin [hydrocodone-acetaminophen]   Social History   Socioeconomic History   Marital status: Single    Spouse name: Not on file   Number of children: Not on file   Years of education: Not on file   Highest education level: Not on file  Occupational History   Not on file  Tobacco Use   Smoking status: Former    Packs/day: 0.50    Years: 45.00    Additional pack years: 0.00    Total pack years: 22.50    Types: Cigarettes    Quit date: 2013    Years since quitting: 11.2   Smokeless tobacco: Never  Vaping Use   Vaping Use: Never used  Substance and Sexual Activity   Alcohol use: Yes    Alcohol/week: 3.0 standard drinks of alcohol    Types: 3 Cans of beer per week    Comment: beer daily   Drug use: Yes    Types: Marijuana    Comment: used  Marijuana   Sexual activity: Not on file  Other Topics Concern   Not on file  Social History Narrative   Not on file   Social Determinants of Health   Financial Resource Strain: Not on file  Food Insecurity: No Food Insecurity (11/19/2022)   Hunger Vital Sign    Worried About Running Out of Food in the Last Year: Never true    Ran Out of Food in the Last Year: Never true   Transportation Needs: No Transportation Needs (11/19/2022)   PRAPARE - Hydrologist (Medical): No    Lack of Transportation (Non-Medical): No  Physical Activity: Not on file  Stress: Not on file  Social Connections: Not on file     Family History: The patient's family history includes Cancer in his brother and mother.  ROS:   Please see the history of present illness.    Has not had syncope.  Not using nitroglycerin.  All other systems reviewed and are negative.  EKGs/Labs/Other Studies Reviewed:    The following studies were reviewed  today:  EKG:  11/26/21: SR inferior infarct RBBB  Cardiac Studies & Procedures   CARDIAC CATHETERIZATION  CARDIAC CATHETERIZATION 08/02/2022  Narrative CONCLUSIONS: Normal right heart pressures.  In fact the patient seems somewhat volume depleted.  Mean capillary wedge pressure 2 mmHg LV gram reveals dyskinesis of inferobasal and mid inferior wall.  Normal anterior wall motion.  EF 40%.  LVEDP 6 mmHg. Coronary anatomy is static compared to the most recent angiogram from 2020. There is 40% in-stent restenosis in the right coronary. There is eccentric 70 % stenosis in the LAD crossing over a moderate first diagonal.  Ostial LAD is less than 50%. Distal left main is 40%. Distal circumflex disease is 50 to 60%, showing some improvement compared to prior. RECOMMENDATIONS: Continue aggressive risk factor modification. Guideline directed medical therapy of systolic heart failure.  Findings Coronary Findings Diagnostic  Dominance: Right  Left Main Mid LM lesion is 40% stenosed. Dist LM lesion is 30% stenosed.  Left Anterior Descending Prox LAD lesion is 70% stenosed.  Second Diagonal Branch Vessel is small in size.  Ramus Intermedius Vessel is small.  Left Circumflex Vessel is small. Mid Cx lesion is 70% stenosed. Dist Cx lesion is 35% stenosed.  Right Coronary Artery Mid RCA lesion is 40% stenosed. The  lesion was previously treated .  Right Posterior Atrioventricular Artery Vessel is small in size.  Third Right Posterolateral Branch Vessel is small in size. Collaterals 3rd RPL filled by collaterals from 1st Mrg.  Intervention  No interventions have been documented.   CARDIAC CATHETERIZATION  CARDIAC CATHETERIZATION 09/25/2018  Narrative  Moderately severe coronary artery disease with stable anatomy since acute intervention in June 2018.  Distal left main is calcified and is 40 to 50% narrowed.  Proximal to mid LAD within a tortuous segment after the first diagonal.  Lesion is calcified and angulated.  Widely patent ramus  Circumflex contains distal 70% and distal obtuse marginal 80% stenosis.  There is 30% in-stent restenosis in the mid right coronary.  Otherwise, widely patent.  LV function is decreased with akinesis of the inferobasal wall.  EF is estimated to be 35%.  LVEDP is upper normal 16 to 18 mmHg.  RECOMMENDATIONS:   There is mild ischemia in the anterior wall distribution, anatomy is stable compared to a year and a half ago, and symptoms are chronic and stable.  Plan to continue medical therapy.  Intensify secondary risk modification.  Increase statin therapy to maximal intensity.  Unstable symptoms or progressive angina would lead to consideration of CABG versus high risk PCI.  PCI risk would be high due to distal left main through which devices/stents may not track given the angulation of the LAD off the left main and the presence of calcium.  Once anal biopsy is performed, will start Plavix and discontinue aspirin.  Plavix can be paused if further surgical treatment is needed.  Findings Coronary Findings Diagnostic  Dominance: Right  Left Main Mid LM lesion is 50% stenosed.  Left Anterior Descending Prox LAD lesion is 75% stenosed.  Second Diagonal Branch Vessel is small in size.  Ramus Intermedius Vessel is small.  Left Circumflex Vessel  is small. Mid Cx lesion is 70% stenosed. Dist Cx lesion is 80% stenosed.  Right Coronary Artery Mid RCA lesion is 30% stenosed. The lesion was previously treated.  Right Posterior Atrioventricular Artery Vessel is small in size.  Third Right Posterolateral Branch Vessel is small in size. Collaterals 3rd RPL filled by collaterals from 1st Mrg.  Intervention  No interventions have been documented.   STRESS TESTS  MYOCARDIAL PERFUSION IMAGING 05/17/2022  Narrative   Findings are consistent with prior myocardial infarction. The study is intermediate risk.   No ST deviation was noted.   LV perfusion is abnormal. Defect 1: There is a large defect with severe reduction in uptake present in the apical to basal inferior location(s) that is fixed. There is abnormal wall motion in the defect area. Consistent with infarction.   Left ventricular function is abnormal. There was a single regional abnormality. Nuclear stress EF: 46 %. The left ventricular ejection fraction is mildly decreased (45-54%). End diastolic cavity size is mildly enlarged. End systolic cavity size is mildly enlarged.   Prior study available for comparison from 09/21/2018.  Large inferior infarct from apex to base with no ischemia Estimated EF 46%   ECHOCARDIOGRAM  ECHOCARDIOGRAM COMPLETE 05/02/2022  Narrative ECHOCARDIOGRAM REPORT    Patient Name:   SEIJI LETBETTER  Date of Exam: 05/02/2022 Medical Rec #:  [id number]     Height:       66.0 in Accession #:    [id number]    Weight:       166.0 lb Date of Birth:  Sep 07, 1955      BSA:          1.848 m Patient Age:    31 years      BP:           124/70 mmHg Patient Gender: M             HR:           68 bpm. Exam Location:  [facility]  Procedure: 2D Echo, 3D Echo, Cardiac Doppler and Color Doppler  Indications:    R06.02 SOB  History:        Patient has prior history of Echocardiogram examinations, most recent 10/20/2020. CHF, Previous Myocardial Infarction and  CAD; Risk Factors:Hypertension, Dyslipidemia and Sleep Apnea. Ischemic cardiomyopathy.  Sonographer:    Diamond Nickel RCS Referring Phys: [place]   1. Left ventricular ejection fraction, by estimation, is 35 to 40%. Left ventricular ejection fraction by 3D volume is 32 %. The left ventricle has moderately decreased function. The left ventricle demonstrates regional wall motion abnormalities (see scoring diagram/findings for description). The left ventricular internal cavity size was mildly dilated. Left ventricular diastolic parameters are consistent with Grade I diastolic dysfunction (impaired relaxation). There is akinesis of the left ventricular, entire inferior wall. There is hypokinesis of the left ventricular, basal-mid inferoseptal wall and inferolateral wall. 2. Right ventricular systolic function is normal. The right ventricular size is normal. Tricuspid regurgitation signal is inadequate for assessing PA pressure. 3. The mitral valve is normal in structure. No evidence of mitral valve regurgitation. 4. The aortic valve is tricuspid. There is moderate calcification of the aortic valve. There is moderate thickening of the aortic valve. Aortic valve regurgitation is not visualized. Aortic valve sclerosis/calcification is present, without any evidence of aortic stenosis. 5. Aortic dilatation noted. There is borderline dilatation of the aortic root, measuring 39 mm. There is mild dilatation of the ascending aorta, measuring 40 mm. 6. The inferior vena cava is normal in size with greater than 50% respiratory variability, suggesting right atrial pressure of 3 mmHg.  Comparison(s): The left ventricular function is worsened. The left ventricular wall motion abnormality is worse.  FINDINGS Left Ventricle: Left ventricular ejection fraction, by estimation, is 35 to 40%. Left ventricular ejection fraction by  3D volume is 32 %. The left ventricle has moderately decreased  function. The left ventricle demonstrates regional wall motion abnormalities. The left ventricular internal cavity size was mildly dilated. There is borderline left ventricular hypertrophy. Left ventricular diastolic parameters are consistent with Grade I diastolic dysfunction (impaired relaxation). Normal left ventricular filling pressure.  Right Ventricle: The right ventricular size is normal. No increase in right ventricular wall thickness. Right ventricular systolic function is normal. Tricuspid regurgitation signal is inadequate for assessing PA pressure.  Left Atrium: Left atrial size was normal in size.  Right Atrium: Right atrial size was normal in size.  Pericardium: There is no evidence of pericardial effusion.  Mitral Valve: The mitral valve is normal in structure. Mild to moderate mitral annular calcification. No evidence of mitral valve regurgitation.  Tricuspid Valve: The tricuspid valve is normal in structure. Tricuspid valve regurgitation is not demonstrated.  Aortic Valve: The aortic valve is tricuspid. There is moderate calcification of the aortic valve. There is moderate thickening of the aortic valve. Aortic valve regurgitation is not visualized. Aortic valve sclerosis/calcification is present, without any evidence of aortic stenosis.  Pulmonic Valve: The pulmonic valve was normal in structure. Pulmonic valve regurgitation is mild.  Aorta: Aortic dilatation noted. There is borderline dilatation of the aortic root, measuring 39 mm. There is mild dilatation of the ascending aorta, measuring 40 mm.  Venous: The inferior vena cava is normal in size with greater than 50% respiratory variability, suggesting right atrial pressure of 3 mmHg.  IAS/Shunts: No atrial level shunt detected by color flow Doppler.   LEFT VENTRICLE PLAX 2D LVIDd:         5.20 cm         Diastology LVIDs:         4.80 cm         LV e' medial:    7.40 cm/s LV PW:         0.90 cm         LV E/e'  medial:  9.2 LV IVS:        1.20 cm         LV e' lateral:   9.03 cm/s LVOT diam:     2.00 cm         LV E/e' lateral: 7.5 LV SV:         58 LV SV Index:   31 LVOT Area:     3.14 cm        3D Volume EF LV 3D EF:    Left ventricul ar ejection fraction by 3D volume is 32 %.  3D Volume EF: 3D EF:        32 % LV EDV:       181 ml LV ESV:       123 ml LV SV:        58 ml  RIGHT VENTRICLE RV Basal diam:  2.90 cm RV S prime:     13.10 cm/s TAPSE (M-mode): 2.4 cm  LEFT ATRIUM             Index        RIGHT ATRIUM           Index LA diam:        2.50 cm 1.35 cm/m   RA Area:     14.80 cm LA Vol (A2C):   26.5 ml 14.34 ml/m  RA Volume:   38.00 ml  20.57 ml/m LA Vol (A4C):   15.4 ml 8.34  ml/m LA Biplane Vol: 20.5 ml 11.10 ml/m AORTIC VALVE             PULMONIC VALVE LVOT Vmax:   97.40 cm/s  PR End Diast Vel: 4.33 msec LVOT Vmean:  57.800 cm/s LVOT VTI:    0.185 m  AORTA Ao Root diam: 4.00 cm Ao Asc diam:  3.95 cm  MITRAL VALVE MV Area (PHT): 2.80 cm    SHUNTS MV Decel Time: 271 msec    Systemic VTI:  0.18 m MV E velocity: 67.80 cm/s  Systemic Diam: 2.00 cm MV A velocity: 84.10 cm/s MV E/A ratio:  0.81  Mihai Croitoru MD Electronically signed by Sanda Klein MD Signature Date/Time: 05/02/2022/12:53:56 PM    Final    MONITORS  CARDIAC EVENT MONITOR 10/28/2017  Narrative  Basic underlying rhythm is normal sinus rhythm  Premature ventricular contractions occasionally in a bigeminal pattern.  Fluttering and palpitations correlate with premature ventricular contractions.  PVCs frequently present without complaints.  Basic underlying rhythm is normal sinus rhythm. Isolated PVCs occasionally and trigeminy and bigeminy correlate with patient's complaints no sustained arrhythmias or profound bradycardia.             Recent Labs: 11/19/2022: Magnesium 1.7 11/20/2022: ALT 13; BUN <5; Creatinine, Ser 0.64; Hemoglobin 10.3; Platelets 386; Potassium 4.0; Sodium  134  Recent Lipid Panel    Component Value Date/Time   CHOL 135 04/22/2022 0953   TRIG 42 04/22/2022 0953   HDL 71 04/22/2022 0953   CHOLHDL 1.9 04/22/2022 0953   CHOLHDL 3.3 02/03/2017 1323   VLDL 8 02/03/2017 1323   LDLCALC 54 04/22/2022 0953    Physical Exam:    VS:  BP 110/60   Pulse 74   Ht 5\' 7"  (1.702 m)   Wt 164 lb 9.6 oz (74.7 kg)   SpO2 97%   BMI 25.78 kg/m     Wt Readings from Last 3 Encounters:  11/27/22 164 lb 9.6 oz (74.7 kg)  11/19/22 169 lb 12.1 oz (77 kg)  08/02/22 163 lb (73.9 kg)    Gen: no distress   Neck: No JVD Cardiac: No Rubs or Gallops, no murmur, RRR +2 radial pulses Respiratory: Clear to auscultation bilaterally, normal effort, normal  respiratory rate GI: Soft, nontender, non-distended  MS: trace left leg edema;  moves all extremities, dark draining from R leg wound vac Neuro:  At time of evaluation, alert and oriented to person/place/time/situation  Psych: Normal affect, patient feels ok  ASSESSMENT:    1. Premature ventricular contractions   2. Tobacco abuse   3. HIV infection, unspecified symptom status   4. OSA on CPAP   5. Essential hypertension   6. Acquired dilation of ascending aorta and aortic root   7. HFrEF (heart failure with reduced ejection fraction)   8. Coronary artery disease involving native coronary artery of native heart with angina pectoris   9. Hyperlipidemia, unspecified hyperlipidemia type     PLAN:    Frequent PVCs - offered increase in BB - will get one week heart monitor  Alcohol abuse - discussed cessation  Tobacco Abuse Marijuana Abuse - discussed cessation; his heavy smoking contributes to his SOB   Heart Failure Reduced Ejection Fraction (combined systolic and diastolic) - chronic - NYHA class II, Stage C, euvolemic, etiology is multi-focval - metoprolol 25 mg PO daily  - ARNI 24/26 mg  - SGLT2i continued - aldactone deferred he does not want to start new medications  Coronary Artery  Disease; Obstructive/Nonobstructive -  with HIV On HAART - asymptomatic  - continue ASA 81 mg; SDM about DAPT; will stop plavix for now - continue statin, goal LDL < 55 - continue BB  HTN - continue therapy as above  OSA - CPAP  RBBB - Monitor  Mild aortic dilation - repeat echo in September 2024  Former tobacco abuse - discussed cessation   Me or Team f/u in September after echo   Medication Adjustments/Labs and Tests Ordered: Current medicines are reviewed at length with the patient today.  Concerns regarding medicines are outlined above.  Orders Placed This Encounter  Procedures   LONG TERM MONITOR (3-14 DAYS)   EKG 12-Lead   ECHOCARDIOGRAM COMPLETE   Meds ordered this encounter  Medications   nitroGLYCERIN (NITROSTAT) 0.4 MG SL tablet    Sig: Place 1 tablet (0.4 mg total) under the tongue every 5 (five) minutes x 3 doses as needed for chest pain.    Dispense:  25 tablet    Refill:  3    Patient Instructions  Medication Instructions:  Your physician recommends that you continue on your current medications as directed. Please refer to the Current Medication list given to you today.  *If you need a refill on your cardiac medications before your next appointment, please call your pharmacy*   Lab Work: NONE If you have labs (blood work) drawn today and your tests are completely normal, you will receive your results only by: [place] (if you have MyChart) OR A paper copy in the mail If you have any lab test that is abnormal or we need to change your treatment, we will call you to review the results.   Testing/Procedures: SEPT 2024: Your physician has requested that you have an echocardiogram. Echocardiography is a painless test that uses sound waves to create images of your heart. It provides your doctor with information about the size and shape of your heart and how well your heart's chambers and valves are working. This procedure takes approximately one  hour. There are no restrictions for this procedure. Please do NOT wear cologne, perfume, aftershave, or lotions (deodorant is allowed). Please arrive 15 minutes prior to your appointment time.  Your physician has requested that you wear a heart monitor.    Follow-Up: At [facility], you and your health needs are our priority.  As part of our continuing mission to provide you with exceptional heart care, we have created designated Provider Care Teams.  These Care Teams include your primary Cardiologist (physician) and Advanced Practice Providers (APPs -  Physician Assistants and Nurse Practitioners) who all work together to provide you with the care you need, when you need it.  We recommend signing up for the patient portal called "MyChart".  Sign up information is provided on this After Visit Summary.  MyChart is used to connect with patients for Virtual Visits (Telemedicine).  Patients are able to view lab/test results, encounter notes, upcoming appointments, etc.  Non-urgent messages can be sent to your provider as well.   To learn more about what you can do with MyChart, go to [email or web address].    Your next appointment:   Sept 2024 after Echo  Provider:   Rudean Haskell, MD or Ambrose Pancoast, NP, or Nicholes Rough, PSA        Other Instructions Bryn Gulling- Long Term Monitor Instructions  Your physician has requested you wear a ZIO patch monitor for 7 days.  This is a single patch monitor. [employer] supplies one patch monitor per  enrollment. Additional stickers are not available. Please do not apply patch if you will be having a Nuclear Stress Test,   Cardiac CT, MRI, or Chest Xray during the period you would be wearing the  monitor. The patch cannot be worn during these tests. You cannot remove and re-apply the  ZIO XT patch monitor.  Your ZIO patch monitor will be mailed 3 day USPS to your address on file. It may take 3-5 days  to receive your monitor after you have been  enrolled.  Once you have received your monitor, please review the enclosed instructions. Your monitor  has already been registered assigning a specific monitor serial # to you.  Billing and Patient Assistance Program Information  We have supplied [employer] with any of your insurance information on file for billing purposes. [employer] offers a sliding scale Patient Assistance Program for patients that do not have  insurance, or whose insurance does not completely cover the cost of the ZIO monitor.  You must apply for the Patient Assistance Program to qualify for this discounted rate.  To apply, please call [employer] at [phone], select option 4, select option 2, ask to apply for  Patient Assistance Program. Theodore Demark will ask your household income, and how many people  are in your household. They will quote your out-of-pocket cost based on that information.  [employer] will also be able to set up a 87-month, interest-free payment plan if needed.  Applying the monitor   Shave hair from upper left chest.  Hold abrader disc by orange tab. Rub abrader in 40 strokes over the upper left chest as  indicated in your monitor instructions.  Clean area with 4 enclosed alcohol pads. Let dry.  Apply patch as indicated in monitor instructions. Patch will be placed under collarbone on left  side of chest with arrow pointing upward.  Rub patch adhesive wings for 2 minutes. Remove white label marked "1". Remove the white  label marked "2". Rub patch adhesive wings for 2 additional minutes.  While looking in a mirror, press and release button in center of patch. A small green light will  flash 3-4 times. This will be your only indicator that the monitor has been turned on.  Do not shower for the first 24 hours. You may shower after the first 24 hours.  Press the button if you feel a symptom. You will hear a small click. Record Date, Time and  Symptom in the Patient Logbook.  When you are ready to remove the  patch, follow instructions on the last 2 pages of Patient  Logbook. Stick patch monitor onto the last page of Patient Logbook.  Place Patient Logbook in the blue and white box. Use locking tab on box and tape box closed  securely. The blue and white box has prepaid postage on it. Please place it in the mailbox as  soon as possible. Your physician should have your test results approximately 7 days after the  monitor has been mailed back to [facility].  Call [facility] at [phone] if you have questions regarding  your ZIO XT patch monitor. Call them immediately if you see an orange light blinking on your  monitor.  If your monitor falls off in less than 4 days, contact our Monitor department at [phone].  If your monitor becomes loose or falls off after 4 days call [employer] at [phone] for  suggestions on securing your monitor     Signed, Werner Lean, MD  11/27/2022 10:24  AM    [place]

## 2022-11-27 NOTE — Progress Notes (Unsigned)
Enrolled patient for a 7 day Zio XT monitor to be mailed to patients home.  

## 2022-12-09 DIAGNOSIS — I493 Ventricular premature depolarization: Secondary | ICD-10-CM

## 2022-12-26 ENCOUNTER — Other Ambulatory Visit (HOSPITAL_COMMUNITY): Payer: Medicare HMO

## 2023-01-02 ENCOUNTER — Telehealth: Payer: Self-pay | Admitting: Internal Medicine

## 2023-01-02 NOTE — Telephone Encounter (Signed)
Patient calling for monitor results. Please advise

## 2023-01-02 NOTE — Telephone Encounter (Signed)
Advised that provider has not reviewed results.  Once results are reviewed our office will call with results.

## 2023-01-03 ENCOUNTER — Telehealth: Payer: Self-pay

## 2023-01-03 MED ORDER — METOPROLOL SUCCINATE ER 25 MG PO TB24: 25.0000 mg | ORAL_TABLET | Freq: Two times a day (BID) | ORAL | 3 refills | Status: DC

## 2023-01-03 NOTE — Telephone Encounter (Signed)
-----   Message from Christell Constant, MD sent at 01/03/2023 12:14 PM EDT ----- Results: Rare PVCs, and one episode of NSVT in the setting of prior heart disease Plan: Increase metoprolol 50 mg; continue work with substance cessation.  Christell Constant, MD

## 2023-01-03 NOTE — Telephone Encounter (Signed)
The patient has been notified of the result and verbalized understanding.  All questions (if any) were answered. Landrie Beale N Kayceon Oki, RN 01/03/2023 1:40 PM   Pt reports takes metoprolol BID.  Increase dose ordered as 50 mg total pt to take 25 mg PO BID.   Advised pt to monitor BP and HR.  Reports a nurse comes out 3 times per week to change dressing on leg and check BP.  Will advise of medication change. All questions answered advised to call in with concerns.

## 2023-03-04 ENCOUNTER — Telehealth: Payer: Self-pay | Admitting: Cardiovascular Disease

## 2023-03-04 NOTE — Telephone Encounter (Signed)
Patient is requesting provider switch from Dr. Izora Ribas (previous pt of smith) , to Dr.O'neal.  Once approved I can contact pt to schedule f/u appt.

## 2023-04-18 ENCOUNTER — Ambulatory Visit: Payer: Medicare HMO | Attending: Cardiovascular Disease | Admitting: Cardiovascular Disease

## 2023-04-18 ENCOUNTER — Encounter: Payer: Self-pay | Admitting: Cardiovascular Disease

## 2023-04-18 VITALS — BP 98/48 | HR 60 | Ht 67.0 in | Wt 167.8 lb

## 2023-04-18 DIAGNOSIS — I5022 Chronic systolic (congestive) heart failure: Secondary | ICD-10-CM | POA: Diagnosis not present

## 2023-04-18 DIAGNOSIS — I493 Ventricular premature depolarization: Secondary | ICD-10-CM | POA: Diagnosis not present

## 2023-04-18 DIAGNOSIS — E782 Mixed hyperlipidemia: Secondary | ICD-10-CM | POA: Diagnosis not present

## 2023-04-18 DIAGNOSIS — I2510 Atherosclerotic heart disease of native coronary artery without angina pectoris: Secondary | ICD-10-CM

## 2023-04-18 NOTE — Progress Notes (Signed)
Cardiology Office Note:   Date:  04/18/2023  NAME:  Omar Sandoval    MRN: [id number] DOB:  1956/02/23   PCP:  Bartholomew Boards, MD  Cardiologist:  Lesleigh Noe, MD (Inactive)  Electrophysiologist:  None   Referring MD: Bartholomew Boards, MD   Chief Complaint  Patient presents with   Follow-up         History of Present Illness:   Omar Sandoval is a 67 y.o. male with a hx of systolic heart failure, CAD, HIV, PVCs, hyperlipidemia who presents for follow-up.  Was seen by Dr. Katrinka Blazing.  Now transitioning to our care.  History of a nonischemic cardiomyopathy with a EF 35 to 40%.  Left heart catheterization in December showed stable CAD.  No intervention was pursued.  He reports no symptoms of angina today.  His blood pressure is a bit low 98/48.  No murmurs on exam and euvolemic.  He is on appropriate medical therapy but cannot titrate up with Aldactone.  We did discuss continuing current medications.  His cardiomyopathy is likely mixed.  He does have regional wall motion abnormalities on echo.  Prior history of inferior MI.  EKG also confirms this.  Currently on aspirin and Lipitor.  LDL levels were at goal last year.  He does have HIV but this is well treated.  There is mention of PVCs but these are controlled as well.   Problem List HIV CHF -EF 47% 10/21/2020 -EF 35-40% 04/2022 CAD -inferior STEMI 01/2017 -06/02/2022: 70% mid LAD, 70% circumflex, 40% ISR of mid RCA stent, 40% left main 4. PVCs 5. HLD -T chol 135, HDL 71, LDL 54, TG 42  Past Medical History: Past Medical History:  Diagnosis Date   CHF (congestive heart failure) (HCC)    Coronary artery disease    HIV (human immunodeficiency virus infection) (HCC)    Hyperlipidemia    Immune deficiency disorder (HCC)    Inguinal hernia    Left   Myocardial infarction [facility])    [id number] angioplasty with stents    Past Surgical History: Past Surgical History:  Procedure Laterality Date   COLONOSCOPY     CORONARY ANGIOPLASTY      CORONARY/GRAFT ACUTE MI REVASCULARIZATION N/A 02/03/2017   Procedure: Coronary/Graft Acute MI Revascularization;  Surgeon: [name], MD;  Location: [facility] INVASIVE CV LAB;  Service: Cardiovascular;  Laterality: N/A;   FRACTURE SURGERY     left ankle and right foot   HERNIA REPAIR     I & D EXTREMITY Right 11/19/2022   Procedure: IRRIGATION AND DEBRIDEMENT RIGHT LOWER LEG;  Surgeon: Myrene Galas, MD;  Location: [facility] OR;  Service: Orthopedics;  Laterality: Right;   INGUINAL HERNIA REPAIR Left 08/27/2017   Procedure: LAPAROSCOPIC LEFT INGUINAL HERNIA REPAIR WITH MESH;  Surgeon: Axel Filler, MD;  Location[facility] OR;  Service: General;  Laterality: Left;   INSERTION OF MESH Left 08/27/2017   Procedure: INSERTION OF MESH;  Surgeon: Axel Filler, MD;  Location: [facility] OR;  Service: General;  Laterality: Left;   LEFT HEART CATH AND CORONARY ANGIOGRAPHY N/A 02/03/2017   Procedure: Left Heart Cath and Coronary Angiography;  Surgeon: [name], MD;  Location: [facility] INVASIVE CV LAB;  Service: Cardiovascular;  Laterality: N/A;   LEFT HEART CATH AND CORONARY ANGIOGRAPHY N/A 09/25/2018   Procedure: LEFT HEART CATH AND CORONARY ANGIOGRAPHY;  Surgeon: [name], MD;  Location: [facility] INVASIVE CV LAB;  Service: Cardiovascular;  Laterality: N/A;   OPEN REDUCTION INTERNAL FIXATION (ORIF)  DISTAL RADIAL FRACTURE Right 07/20/2016   Procedure: OPEN REDUCTION INTERNAL FIXATION (ORIF) DISTAL RADIAL FRACTURE;  Surgeon: [name], MD;  Location: [facility] OR;  Service: Orthopedics;  Laterality: Right;   RIGHT/LEFT HEART CATH AND CORONARY ANGIOGRAPHY N/A 08/02/2022   Procedure: RIGHT/LEFT HEART CATH AND CORONARY ANGIOGRAPHY;  Surgeon: [name], MD;  Location: [facility] INVASIVE CV LAB;  Service: Cardiovascular;  Laterality: N/A;   SURGERY SCROTAL / TESTICULAR     nondecended testes    Current Medications: Current Meds  Medication Sig   aspirin EC 81 MG tablet Take by mouth.   atorvastatin (LIPITOR) 40 MG tablet TAKE 1 TABLET  BY MOUTH ONCE DAILY AT 6PM .   Dolutegravir-lamiVUDine 50-300 MG TABS Take 1 tablet by mouth daily.    empagliflozin (JARDIANCE) 10 MG TABS tablet Take 1 tablet (10 mg total) by mouth daily before breakfast.   metoprolol succinate (TOPROL XL) 25 MG 24 hr tablet Take 1 tablet (25 mg total) by mouth 2 (two) times daily.   nitroGLYCERIN (NITROSTAT) 0.4 MG SL tablet Place 1 tablet (0.4 mg total) under the tongue every 5 (five) minutes x 3 doses as needed for chest pain.   oxyCODONE-acetaminophen (PERCOCET) 10-325 MG tablet Take 0.5-1 tablets by mouth every 4 (four) hours as needed for pain.   sacubitril-valsartan (ENTRESTO) 24-26 MG Take 1 tablet by mouth 2 (two) times daily. 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  Tobacco Use   Smoking status: Former    Current packs/day: 0.00    Average packs/day: 0.5 packs/day for 45.0 years (22.5 ttl pk-yrs)    Types: Cigarettes    Start date: 1968    Quit date: 2013    Years since quitting: 11.6   Smokeless tobacco: Never  Vaping Use   Vaping status: Never Used  Substance and Sexual Activity   Alcohol use: Yes    Alcohol/week: 3.0 standard drinks of alcohol    Types: 3 Cans of beer per week    Comment: beer daily   Drug use: Yes    Types: Marijuana    Comment: used  Marijuana   Sexual activity: Not on file  Other Topics Concern   Not on file  Social History Narrative   Not on file   Social Determinants of Health   Financial Resource Strain: Not on file  Food Insecurity: Low Risk  (01/08/2023)   [facility]    Food vital sign    Within the past 12 months, you worried that your food would run out before you got money to buy more: Never true    Within the past 12 months, the food you bought just didn't last and you didn't have money to get more. : Never true  Transportation Needs: No Transportation Needs (01/08/2023)   [facility]   Transportation    In the past 12 months, has lack of reliable transportation kept you from medical appointments, meetings, work or from getting things needed for daily living? : No  Physical Activity: Not  on file  Stress: Not on file  Social Connections: Unknown (01/08/2022)   Received from [facility]   Social Network    Social Network: Not on file     Family History: The patient's family history includes Cancer in his brother and mother.  ROS:   All other ROS reviewed and negative. Pertinent positives noted in the HPI.     EKGs/Labs/Other Studies Reviewed:   The following studies were personally reviewed by me today:  EKG:  EKG is ordered today.    EKG Interpretation Date/Time:  Friday April 18 2023 14:11:47 EDT Ventricular Rate:  60 PR Interval:  172 QRS Duration:  144 QT Interval:  454 QTC Calculation: 454 R Axis:   -29  Text Interpretation: Normal sinus rhythm Right bundle branch block Lateral infarct , age undetermined Inferior infarct , age undetermined Confirmed by Lennie Odor ([id number]) on 04/18/2023 2:46:25 PM   [facility] 08/02/2022 CONCLUSIONS: Normal right heart pressures.  In fact the patient seems somewhat volume depleted.  Mean capillary wedge pressure 2 mmHg LV gram reveals dyskinesis of inferobasal and mid inferior wall.  Normal anterior wall motion.  EF 40%.  LVEDP 6 mmHg. Coronary anatomy is static compared to the most recent angiogram from 2020. There is 40% in-stent restenosis in the right coronary. There is eccentric 70 % stenosis in the LAD crossing over a moderate first diagonal.  Ostial LAD is less than  50%. Distal left main is 40%. Distal circumflex disease is 50 to 60%, showing some improvement compared to prior. RECOMMENDATIONS: Continue aggressive risk factor modification. Guideline directed medical therapy of systolic heart failure.  TTE 05/02/2022  1. Left ventricular ejection fraction, by estimation, is 35 to 40%. Left  ventricular ejection fraction by 3D volume is 32 %. The left ventricle has  moderately decreased function. The left ventricle demonstrates regional  wall motion abnormalities (see  scoring diagram/findings for description). The left ventricular internal  cavity size was mildly dilated. Left ventricular diastolic parameters are  consistent with Grade I diastolic dysfunction (impaired relaxation). There  is akinesis of the left  ventricular, entire inferior wall. There is hypokinesis of the left  ventricular, basal-mid inferoseptal wall and inferolateral wall.   2. Right ventricular systolic function is normal. The right ventricular  size is normal. Tricuspid regurgitation signal is inadequate for assessing  PA pressure.   3. The mitral valve is normal in structure. No evidence of mitral valve  regurgitation.   4. The aortic valve is tricuspid. There is moderate calcification of the  aortic valve. There is moderate thickening of the aortic valve. Aortic  valve regurgitation is not visualized. Aortic valve  sclerosis/calcification is present, without any evidence  of aortic stenosis.   5. Aortic dilatation noted. There is borderline dilatation of the aortic  root, measuring 39 mm. There is mild dilatation of the ascending aorta,  measuring 40 mm.   6. The inferior vena cava is normal in size with greater than 50%  respiratory variability, suggesting right atrial pressure of 3 mmHg.   Recent Labs: 11/19/2022: Magnesium 1.7 11/20/2022: ALT 13; BUN <5; Creatinine, Ser 0.64; Hemoglobin 10.3; Platelets 386; Potassium 4.0; Sodium 134   Recent Lipid Panel     Component Value Date/Time   CHOL 135 04/22/2022 0953   TRIG 42 04/22/2022 0953   HDL 71 04/22/2022 0953   CHOLHDL 1.9 04/22/2022 0953   CHOLHDL 3.3 02/03/2017 1323   VLDL 8 02/03/2017 1323   LDLCALC 54 04/22/2022 0953    Physical  Exam:   VS:  BP (!) 98/48   Pulse 60   Ht 5\' 7"  (1.702 m)   Wt 167 lb 12.8 oz (76.1 kg)   SpO2 95%   BMI 26.28 kg/m    Wt Readings from Last 3 Encounters:  04/18/23 167 lb 12.8 oz (76.1 kg)  11/27/22 164 lb 9.6 oz (74.7 kg)  11/19/22 169 lb 12.1 oz (77 kg)    General: Well nourished, well developed, in no acute distress Head: Atraumatic, normal size  Eyes: PEERLA, EOMI  Neck: Supple, no JVD Endocrine: No thryomegaly Cardiac: Normal S1, S2; RRR; no murmurs, rubs, or gallops Lungs: Clear to auscultation bilaterally, no wheezing, rhonchi or rales  Abd: Soft, nontender, no hepatomegaly  Ext: No edema, pulses 2+ Musculoskeletal: No deformities, BUE and BLE strength normal and equal Skin: Warm and dry, no rashes   Neuro: Alert and oriented to person, place, time, and situation, CNII-XII grossly intact, no focal deficits  Psych: Normal mood and affect   ASSESSMENT:   Omar Sandoval is a 67 y.o. male who presents for the following: 1. Premature ventricular contractions   2. Chronic systolic heart failure (HCC)   3. Coronary artery disease involving native coronary artery of native heart without angina pectoris   4. Mixed hyperlipidemia     PLAN:   1. Premature ventricular contractions -History of PVCs.  Controlled on beta-blocker.  No symptoms.  Continue to monitor.  2. 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  History of inferior MI with wall motion abnormalities on echo.  Ejection fraction recently 35-40%.  Could be related to HIV as well.  Blood pressure is a bit low.  No further room for medical therapy titration.  Would continue metoprolol succinate 25 mg daily, Entresto 24-26 mg  twice daily, Jardiance 10 mg daily.  I do not believe he will tolerate Aldactone.  Plan for repeat echo in the next 2 weeks.  Follow-up in November plan.  No signs of volume overload.  No need for Lasix.  3. Coronary artery disease involving native coronary artery of native heart without angina pectoris 4. Mixed hyperlipidemia -History of CAD status post inferior STEMI.  Recent heart cath with stable CAD.  It seems all of his lesions are stable and he is without symptoms of angina.  We will continue with aggressive medical therapy.  Currently on aspirin.  On Lipitor 40.  Lipids are at goal.  We will continue to follow him closely for this.  He will follow-up with me in November.  Has an echo pending.  Disposition: Return in about 3 months (around 07/07/2023).  Medication Adjustments/Labs and Tests Ordered: Current medicines are reviewed at length with the patient today.  Concerns regarding medicines are outlined above.  Orders Placed This Encounter  Procedures   EKG 12-Lead   No orders of the defined types were placed in this encounter.  Patient Instructions  Medication Instructions:  Continue same medications *If you need a refill on your cardiac medications before your next appointment, please call your pharmacy*   Lab Work: None ordered   Testing/Procedures: None ordered   Follow-Up: At [facility], you and your health needs are our priority.  As part of our continuing mission to provide you with exceptional heart care, we have created designated Provider Care Teams.  These Care Teams include your primary Cardiologist (physician) and Advanced Practice Providers (APPs -  Physician Assistants and Nurse Practitioners) who all work together  to provide you with the care you need, when you need it.  We recommend signing up for the patient portal called "MyChart".  Sign up information is provided on this After Visit Summary.  MyChart is used to connect with patients for Virtual  Visits (Telemedicine).  Patients are able to view lab/test results, encounter notes, upcoming appointments, etc.  Non-urgent messages can be sent to your provider as well.   To learn more about what you can do with MyChart, go to [email or web address].    Your next appointment:  Keep appointment already scheduled    Provider:  Dr.O'Neal     Time Spent with Patient: I have spent a total of 35 minutes with patient reviewing hospital notes, telemetry, EKGs, labs and examining the patient as well as establishing an assessment and plan that was discussed with the patient.  > 50% of time was spent in direct patient care.  Signed, Lenna Gilford. Flora Lipps, MD, [facility]  [facility]  [address], [address] [place], [place] [address] [phone]  04/18/2023 2:51 PM

## 2023-04-18 NOTE — Patient Instructions (Signed)
Medication Instructions:  Continue same medications *If you need a refill on your cardiac medications before your next appointment, please call your pharmacy*   Lab Work: None ordered   Testing/Procedures: None ordered   Follow-Up: At [facility], you and your health needs are our priority.  As part of our continuing mission to provide you with exceptional heart care, we have created designated Provider Care Teams.  These Care Teams include your primary Cardiologist (physician) and Advanced Practice Providers (APPs -  Physician Assistants and Nurse Practitioners) who all work together to provide you with the care you need, when you need it.  We recommend signing up for the patient portal called "MyChart".  Sign up information is provided on this After Visit Summary.  MyChart is used to connect with patients for Virtual Visits (Telemedicine).  Patients are able to view lab/test results, encounter notes, upcoming appointments, etc.  Non-urgent messages can be sent to your provider as well.   To learn more about what you can do with MyChart, go to [email or web address].    Your next appointment:  Keep appointment already scheduled    Provider:  Dr.O'Neal

## 2023-05-01 ENCOUNTER — Ambulatory Visit (HOSPITAL_COMMUNITY): Payer: Medicare HMO | Attending: Internal Medicine

## 2023-05-01 DIAGNOSIS — I77819 Aortic ectasia, unspecified site: Secondary | ICD-10-CM | POA: Diagnosis not present

## 2023-05-01 DIAGNOSIS — I5020 Unspecified systolic (congestive) heart failure: Secondary | ICD-10-CM | POA: Insufficient documentation

## 2023-05-01 DIAGNOSIS — I493 Ventricular premature depolarization: Secondary | ICD-10-CM | POA: Insufficient documentation

## 2023-05-01 LAB — ECHOCARDIOGRAM COMPLETE
Area-P 1/2: 3.88 cm2
S' Lateral: 4.4 cm

## 2023-05-05 ENCOUNTER — Ambulatory Visit: Payer: Medicare HMO | Admitting: Nurse Practitioner

## 2023-05-08 ENCOUNTER — Other Ambulatory Visit (HOSPITAL_BASED_OUTPATIENT_CLINIC_OR_DEPARTMENT_OTHER): Payer: Self-pay | Admitting: Physician Assistant

## 2023-05-08 DIAGNOSIS — M545 Low back pain, unspecified: Secondary | ICD-10-CM

## 2023-05-13 IMAGING — CR DG KNEE COMPLETE 4+V*L*
4 series · 4 of 4 positions shown · non-contrast
Comparison: None.

CLINICAL DATA: Fall, left knee pain

EXAM:
LEFT KNEE - COMPLETE 4+ VIEW

[knee ap]
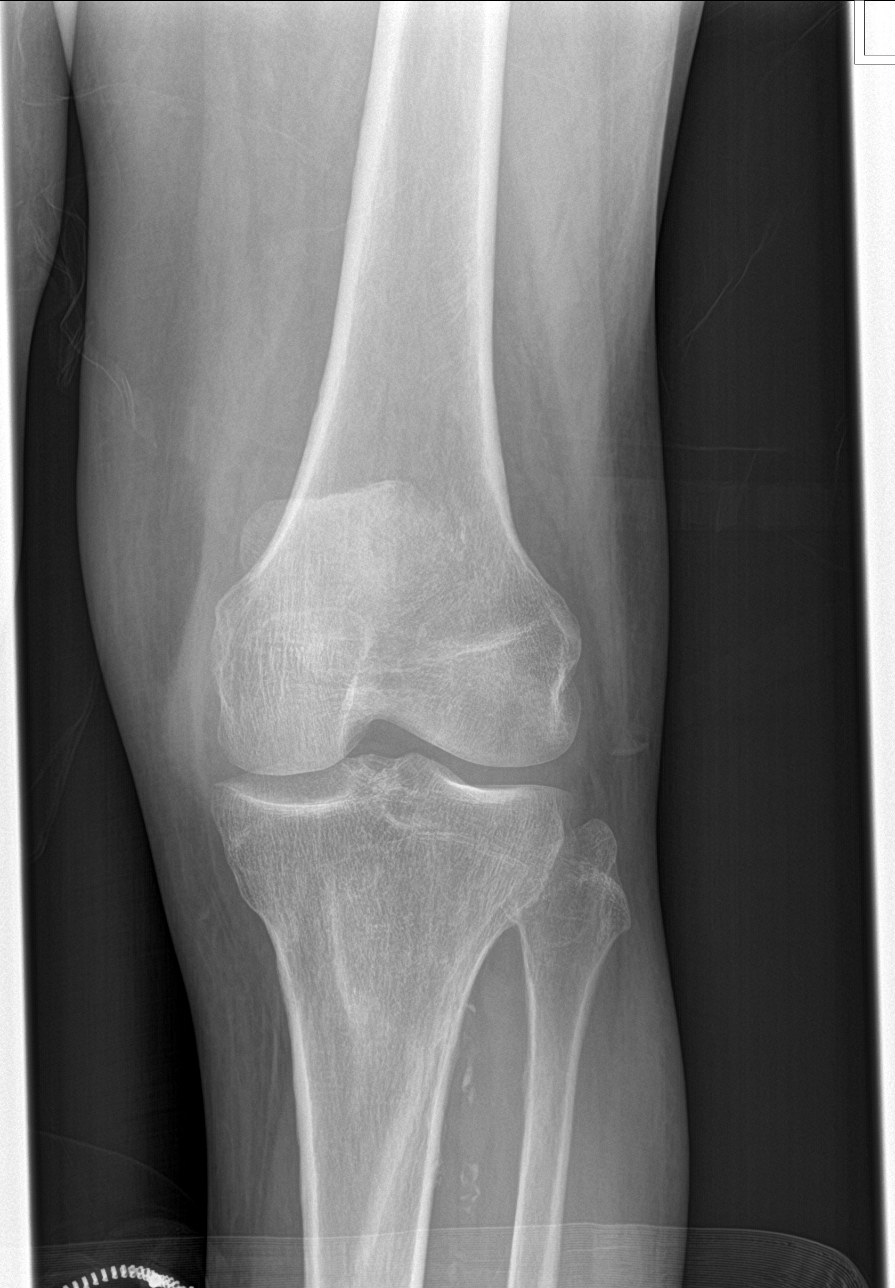

[knee lat]
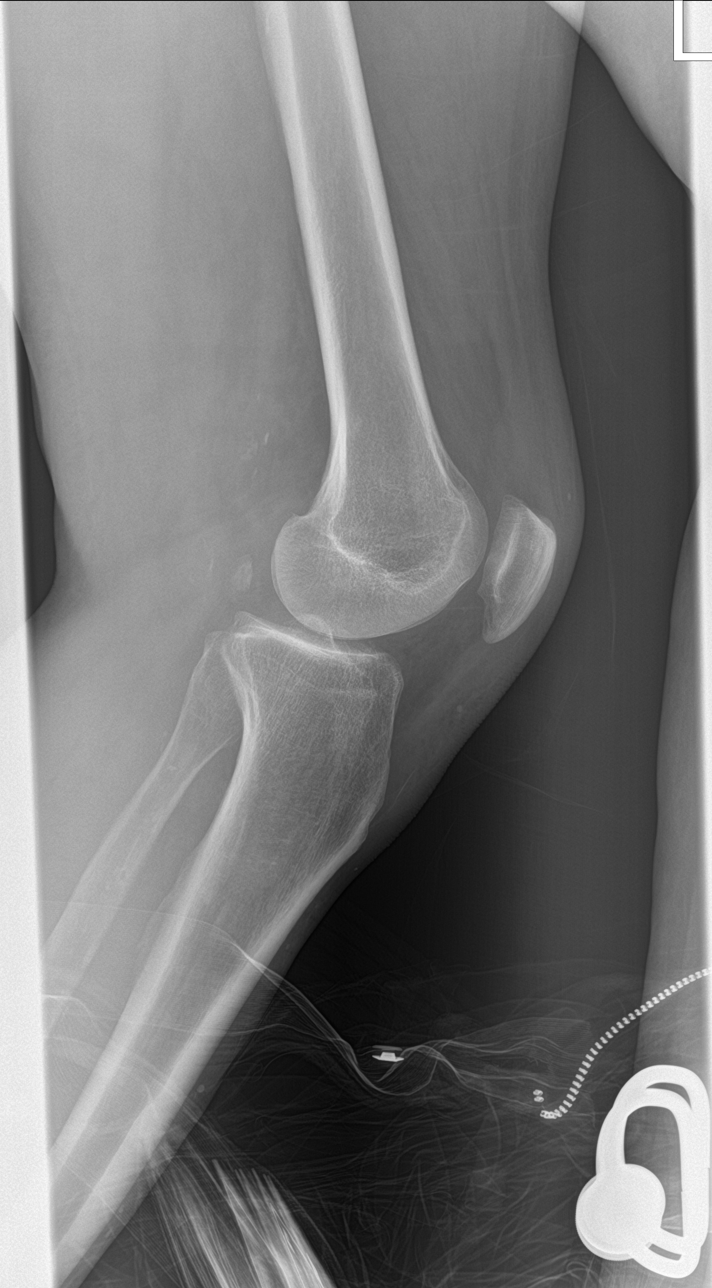

[knee obl (1 of 2)]
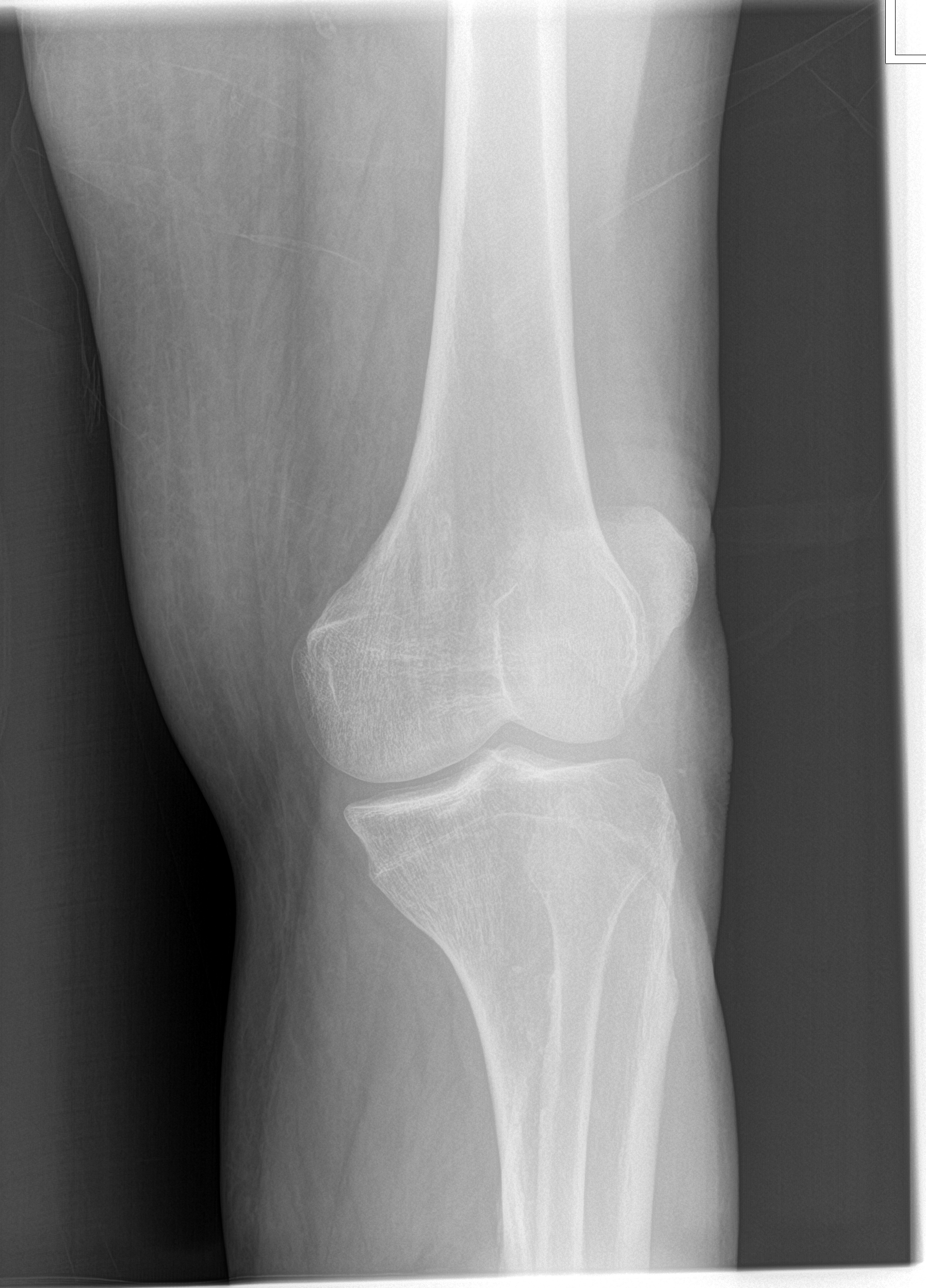

[knee obl (2 of 2)]
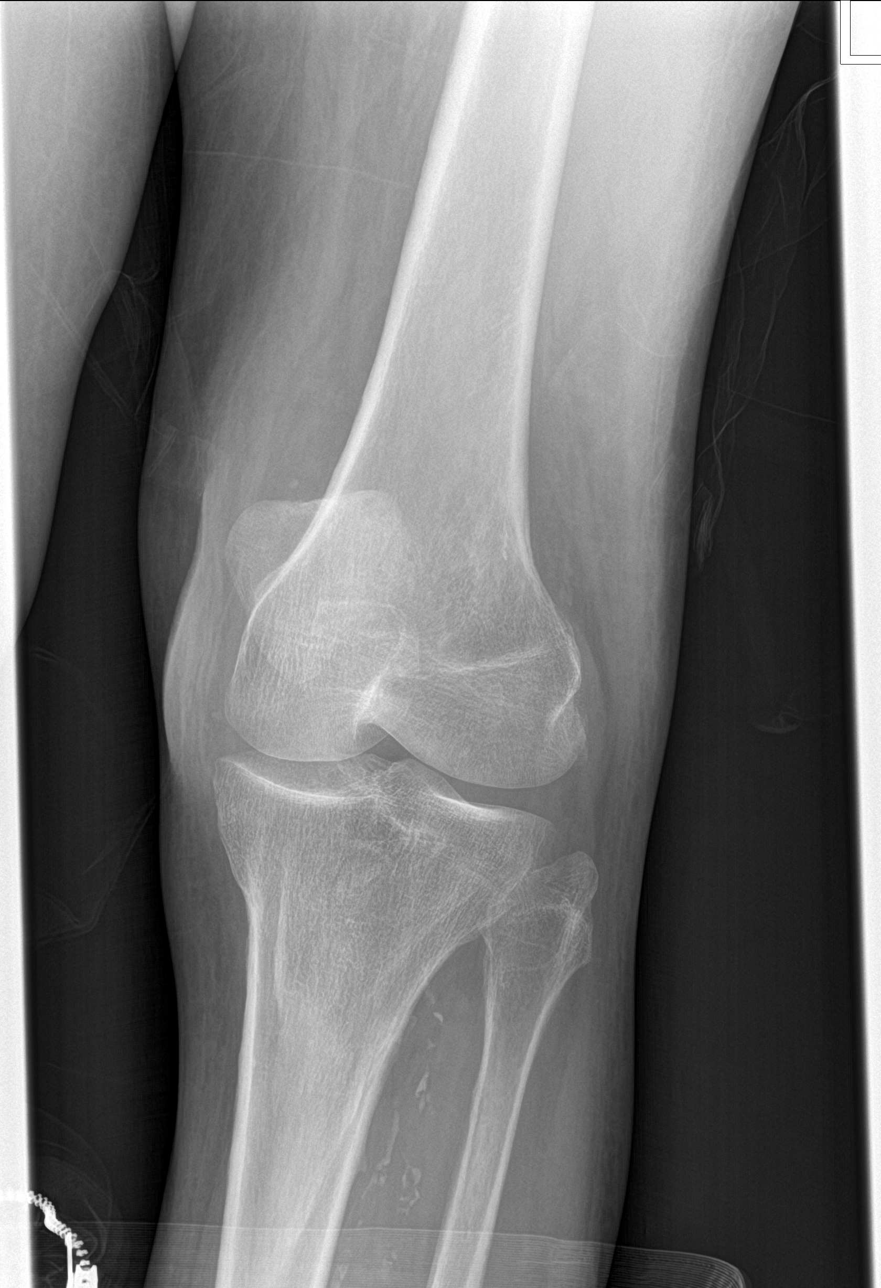

[4 of 4 positions shown; findings below may reference images not displayed]

FINDINGS: No evidence of fracture, dislocation, or joint effusion. No evidence
of arthropathy or other focal bone abnormality. Vascular
calcifications are seen within the posterior soft tissues.
IMPRESSION: Negative.

## 2023-07-06 NOTE — Progress Notes (Unsigned)
Cardiology Office Note:  .   Date:  07/07/2023  ID:  Omar Sandoval, DOB January 25, 1956, MRN [id number] PCP: Bartholomew Boards, MD  [facility] Providers Cardiologist:  Reatha Harps, MD { History of Present Illness: .   Omar Sandoval is a 67 y.o. male with below history who presents for follow-up.   History of Present Illness   Omar Sandoval, a 67 year old male with a history of CAD with inferior STEMI in 2018, systolic heart failure with an ejection fraction of 40-45%, and well-controlled hyperlipidemia, presents for a follow-up appointment. The patient reports occasional dizziness and lightheadedness, which he attributes to his late breakfast. He also experiences a throbbing sensation in his head. The patient is currently taking Entresto, metoprolol, and Jardiance, the latter of which he has been instructed to take before breakfast. 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 The patient also sees a neurologist for issues with his neck and back, for which he receives cortisone shots every three months. Denies CP.          Problem List HIV CHF -EF 47% 10/21/2020 -EF 35-40% 04/2022 -EF 40-45% 05/01/2023 CAD -inferior STEMI 01/2017 -06/02/2022: 70% mid LAD, 70% circumflex, 40% ISR of mid RCA stent, 40% left main 4. PVCs 5. HLD -T chol 135, HDL 71, LDL 54, TG 42 6. 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 Neck Arthritis  -injections     ROS: All other ROS reviewed and negative. Pertinent positives noted in the HPI.     Studies Reviewed: [name]        TTE 05/01/2023  1. Left ventricular ejection fraction, by estimation, is 40 to 45%. The  left ventricle has mildly decreased function. The left ventricle  demonstrates regional wall motion abnormalities with basal to mid inferior  and basal to mid inferolateral akinesis.  Left ventricular diastolic parameters are consistent with  Grade I  diastolic dysfunction (impaired relaxation).   2. Right ventricular systolic function is normal. The right ventricular  size is normal. Tricuspid regurgitation signal is inadequate for assessing  PA pressure.   3. The mitral valve is normal in structure. Trivial mitral valve  regurgitation. No evidence of mitral stenosis.   4. The aortic valve is tricuspid. There is mild calcification of the  aortic valve. Aortic valve regurgitation is not visualized. No aortic  stenosis is present.   5. Aortic dilatation noted. There is mild dilatation of the ascending  aorta, measuring 40 mm.   6. The inferior vena cava is normal in size with greater than 50%  respiratory variability, suggesting right atrial pressure of 3 mmHg.  Physical Exam:   VS:  BP 90/60 (BP Location: Left Arm, Patient Position: Sitting, Cuff Size: Normal)   Pulse (!) 59   Ht 5\' 7"  (1.702 m)   Wt 162 lb (73.5 kg)   BMI 25.37 kg/m    Wt Readings from Last 3 Encounters:  07/07/23 162 lb (73.5 kg)  04/18/23 167 lb 12.8 oz (76.1 kg)  11/27/22 164 lb 9.6 oz (74.7 kg)    GEN: Well nourished, well developed in no acute distress NECK: No JVD; No carotid bruits CARDIAC: RRR, no murmurs, rubs, gallops RESPIRATORY:  Clear to auscultation without rales, wheezing or rhonchi  ABDOMEN: Soft, non-tender, non-distended EXTREMITIES:  No edema; No deformity  ASSESSMENT AND PLAN: .   Assessment and Plan    Systolic  Heart Failure EF improved to 45% with medication. No symptoms of heart failure. Stable weight. Blood pressure slightly low (90/60) but no symptoms of dizziness or lightheadedness. -Continue Entresto 24-26mg  BID. -Reduce Metoprolol succinate to 25mg  daily at night. -Continue Jardiance with food. -Monitor symptoms and weight.  Coronary Artery Disease History of inferior STEMI in 2018. Diffuse 3-vessel CAD managed medically. No symptoms of angina. -Continue Aspirin 81mg  daily. -Continue Lipitor 40mg   daily.  Hyperlipidemia Well controlled with LDL of 54. -Check lipids today. -Continue Lipitor 40mg  daily.  Hepatic and Celiac Artery Aneurysms Stable, followed at [facility]. -[facility] follow[facility] at [facility].  General Health Maintenance -Encourage regular meals. -See APP in 6 months and MD in 1 year.              Follow-up: Return in about 1 year (around 07/06/2024).  Time Spent with Patient: I have spent a total of 35 minutes caring for this patient today face to face, ordering and reviewing labs/tests, reviewing prior records/medical history, examining the patient, establishing an assessment and plan, communicating results/findings to the patient/family, and documenting in the medical record.   Signed, Lenna Gilford. Flora Lipps, MD, [facility]  [facility]  [address] [place], [place] [address] [phone]  9:51 AM

## 2023-07-07 ENCOUNTER — Encounter: Payer: Self-pay | Admitting: Cardiovascular Disease

## 2023-07-07 ENCOUNTER — Ambulatory Visit: Payer: Medicare HMO | Attending: Cardiovascular Disease | Admitting: Cardiovascular Disease

## 2023-07-07 VITALS — BP 90/60 | HR 59 | Ht 67.0 in | Wt 162.0 lb

## 2023-07-07 DIAGNOSIS — I2510 Atherosclerotic heart disease of native coronary artery without angina pectoris: Secondary | ICD-10-CM | POA: Diagnosis not present

## 2023-07-07 DIAGNOSIS — E782 Mixed hyperlipidemia: Secondary | ICD-10-CM | POA: Diagnosis not present

## 2023-07-07 DIAGNOSIS — I5022 Chronic systolic (congestive) heart failure: Secondary | ICD-10-CM | POA: Diagnosis not present

## 2023-07-07 DIAGNOSIS — I493 Ventricular premature depolarization: Secondary | ICD-10-CM

## 2023-07-07 MED ORDER — METOPROLOL SUCCINATE ER 25 MG PO TB24: 25.0000 mg | ORAL_TABLET | Freq: Every day | ORAL | 3 refills | Status: DC

## 2023-07-07 NOTE — Patient Instructions (Addendum)
Medication Instructions:  - Start METOPROLOL SUCCINATE 25mg , once daily at night before bed.     *If you need a refill on your cardiac medications before your next appointment, please call your pharmacy*   Lab Work: LIPIDS    If you have labs (blood work) drawn today and your tests are completely normal, you will receive your results only by: MyChart Message (if you have MyChart) OR A paper copy in the mail If you have any lab test that is abnormal or we need to change your treatment, we will call you to review the results.   Testing/Procedures: NONE    Follow-Up: At [facility], you and your health needs are our priority.  As part of our continuing mission to provide you with exceptional heart care, we have created designated Provider Care Teams.  These Care Teams include your primary Cardiologist (physician) and Advanced Practice Providers (APPs -  Physician Assistants and Nurse Practitioners) who all work together to provide you with the care you need, when you need it.  We recommend signing up for the patient portal called "MyChart".  Sign up information is provided on this After Visit Summary.  MyChart is used to connect with patients for Virtual Visits (Telemedicine).  Patients are able to view lab/test results, encounter notes, upcoming appointments, etc.  Non-urgent messages can be sent to your provider as well.   To learn more about what you can do with MyChart, go to [email or web address].    Your next appointment:   6 month(s)  The format for your next appointment:   In Person  Provider:    6 month(s)  The format for your next appointment:   In Person  Provider:   Edd Fabian, FNP, Micah Flesher, PA-C, Marjie Skiff, PA-C, Robet Leu, PA-C, Juanda Crumble, PA-C, Joni Reining, DNP, ANP, Azalee Course, PA-C, or Bernadene Person, NP    Then, Reatha Harps, MD will plan to see you again in 1 year(s).   Other Instructions   Other Instructions

## 2023-07-08 LAB — LIPID PANEL
Chol/HDL Ratio: 3.2 ratio (ref 0.0–5.0)
Cholesterol, Total: 122 mg/dL (ref 100–199)
HDL: 38 mg/dL — ABNORMAL LOW (ref >39)
LDL Chol Calc (NIH): 72 mg/dL (ref 0–99)
Triglycerides: 54 mg/dL (ref 0–149)
VLDL Cholesterol Cal: 12 mg/dL (ref 5–40)

## 2023-08-05 ENCOUNTER — Other Ambulatory Visit: Payer: Self-pay | Admitting: *Deleted

## 2023-08-05 ENCOUNTER — Other Ambulatory Visit: Payer: Self-pay

## 2023-08-05 MED ORDER — METOPROLOL SUCCINATE ER 25 MG PO TB24: 25.0000 mg | ORAL_TABLET | Freq: Every day | ORAL | 3 refills | Status: DC

## 2023-09-01 ENCOUNTER — Other Ambulatory Visit: Payer: Self-pay

## 2023-09-01 ENCOUNTER — Emergency Department (HOSPITAL_COMMUNITY)
Admission: EM | Admit: 2023-09-01 | Discharge: 2023-09-01 | Disposition: A | Discharge status: Home / Self Care | Payer: 59 | Attending: Emergency Medicine | Admitting: Emergency Medicine

## 2023-09-01 DIAGNOSIS — Z79899 Other long term (current) drug therapy: Secondary | ICD-10-CM | POA: Diagnosis not present

## 2023-09-01 DIAGNOSIS — W541XXA Struck by dog, initial encounter: Secondary | ICD-10-CM | POA: Diagnosis not present

## 2023-09-01 DIAGNOSIS — S01411A Laceration without foreign body of right cheek and temporomandibular area, initial encounter: Secondary | ICD-10-CM | POA: Insufficient documentation

## 2023-09-01 DIAGNOSIS — Z7982 Long term (current) use of aspirin: Secondary | ICD-10-CM | POA: Diagnosis not present

## 2023-09-01 DIAGNOSIS — S0993XA Unspecified injury of face, initial encounter: Secondary | ICD-10-CM | POA: Diagnosis present

## 2023-09-01 DIAGNOSIS — Z23 Encounter for immunization: Secondary | ICD-10-CM | POA: Insufficient documentation

## 2023-09-01 DIAGNOSIS — Z7902 Long term (current) use of antithrombotics/antiplatelets: Secondary | ICD-10-CM | POA: Insufficient documentation

## 2023-09-01 DIAGNOSIS — S01401A Unspecified open wound of right cheek and temporomandibular area, initial encounter: Secondary | ICD-10-CM

## 2023-09-01 MED ORDER — TETANUS-DIPHTH-ACELL PERTUSSIS 5-2.5-18.5 LF-MCG/0.5 IM SUSY
0.5000 mL | PREFILLED_SYRINGE | Freq: Once | INTRAMUSCULAR | Status: AC
  Administered 2023-09-01: 0.5 mL via INTRAMUSCULAR
  Filled 2023-09-01: qty 0.5

## 2023-09-01 MED ORDER — EMPAGLIFLOZIN 10 MG PO TABS: 10.0000 mg | ORAL_TABLET | Freq: Every day | ORAL | 3 refills | Status: AC

## 2023-09-01 MED ORDER — AMOXICILLIN-POT CLAVULANATE 875-125 MG PO TABS: 1.0000 | ORAL_TABLET | Freq: Two times a day (BID) | ORAL | 0 refills | Status: AC

## 2023-09-01 MED ORDER — AMOXICILLIN-POT CLAVULANATE 875-125 MG PO TABS
1.0000 | ORAL_TABLET | Freq: Once | ORAL | Status: AC
  Administered 2023-09-01: 1 via ORAL
  Filled 2023-09-01: qty 1

## 2023-09-01 MED ORDER — LIDOCAINE-EPINEPHRINE (PF) 2 %-1:200000 IJ SOLN
10.0000 mL | Freq: Once | INTRAMUSCULAR | Status: AC
  Administered 2023-09-01: 10 mL
  Filled 2023-09-01: qty 20

## 2023-09-01 NOTE — Discharge Instructions (Addendum)
 Please watch closely for any signs or symptoms of infection.  Signs of infection include redness, swelling, discharge, or increased pain. Please follow-up with your doctor for suture removal in 5 to 7 days.  Return immediately if you have any signs of infection.

## 2023-09-01 NOTE — ED Provider Notes (Signed)
 [facility] AT [facility] Provider Note   CSN: [id number] Arrival date & time: 09/01/23  1118     History  Chief Complaint  Patient presents with   dog scratch to face    Omar Sandoval is a 68 y.o. male.  HPI 68 year old male presents today with laceration to face.  Initially stated that dog scratched him in the face but then stated he was unclear.  It is his dog when he was playing with the dog.  He noted an injury to his face.  Unclear when last tetanus shot was.    Home Medications Prior to Admission medications   Medication Sig Start Date End Date Taking? Authorizing Provider  amoxicillin -clavulanate (AUGMENTIN ) 875-125 MG tablet Take 1 tablet by mouth every 12 (twelve) hours. 09/01/23  Yes Levander Houston, MD  aspirin  EC 81 MG tablet Take by mouth. 09/20/22   [provider]  atorvastatin  (LIPITOR) 40 MG tablet TAKE 1 TABLET BY MOUTH ONCE DAILY AT 6PM . 06/18/22   Claudene Victory ORN, MD  clopidogrel  (PLAVIX ) 75 MG tablet TAKE 1 TABLET BY MOUTH ONCE DAILY . APPOINTMENT REQUIRED FOR FUTURE REFILLS 05/13/22   Claudene Victory ORN, MD  Dolutegravir -lamiVUDine  50-300 MG TABS Take 1 tablet by mouth daily.  06/03/18   [provider]  empagliflozin  (JARDIANCE ) 10 MG TABS tablet Take 1 tablet (10 mg total) by mouth daily before breakfast. 09/01/23   O'Neal, Darryle Ned, MD  metoprolol  succinate (TOPROL  XL) 25 MG 24 hr tablet Take 1 tablet (25 mg total) by mouth daily. 08/05/23   O'Neal, Darryle Ned, MD  nitroGLYCERIN  (NITROSTAT ) 0.4 MG SL tablet Place 1 tablet (0.4 mg total) under the tongue every 5 (five) minutes x 3 doses as needed for chest pain. 11/27/22   Santo Stanly LABOR, MD  oxyCODONE -acetaminophen  (PERCOCET) 10-325 MG tablet Take 0.5-1 tablets by mouth every 4 (four) hours as needed for pain.    [provider]  sacubitril -valsartan  (ENTRESTO ) 24-26 MG Take 1 tablet by mouth 2 (two) times daily. 05/03/22   Lucien Orren SAILOR, PA-C       Allergies    Chocolate, Lactose intolerance (gi), Milk (cow), and Vicodin [hydrocodone-acetaminophen ]    Review of Systems   Review of Systems  Physical Exam Updated Vital Signs BP 107/68 (BP Location: Left Arm)   Pulse 66   Temp 97.8 F (36.6 C) (Oral)   Resp 20   SpO2 96%  Physical Exam Vitals and nursing note reviewed.  Constitutional:      Appearance: He is well-developed.  HENT:     Head: Normocephalic and atraumatic.     Comments: Stellate laceration to right cheek    Right Ear: External ear normal.     Left Ear: External ear normal.     Nose: Nose normal.  Eyes:     Extraocular Movements: Extraocular movements intact.  Neck:     Trachea: No tracheal deviation.  Pulmonary:     Effort: Pulmonary effort is normal.  Musculoskeletal:        General: Normal range of motion.  Skin:    General: Skin is warm and dry.  Neurological:     Mental Status: He is alert and oriented to person, place, and time.  Psychiatric:        Mood and Affect: Mood normal.        Behavior: Behavior normal.     ED Results / Procedures / Treatments   Labs (all labs ordered are listed,  but only abnormal results are displayed) Labs Reviewed - No data to display  EKG None  Radiology No results found.  Procedures Procedures    Medications Ordered in ED Medications  amoxicillin -clavulanate (AUGMENTIN ) 875-125 MG per tablet 1 tablet (has no administration in time range)  Tdap (BOOSTRIX ) injection 0.5 mL (0.5 mLs Intramuscular Given 09/01/23 1309)  lidocaine -EPINEPHrine  (XYLOCAINE  W/EPI) 2 %-1:200000 (PF) injection 10 mL (10 mLs Infiltration Given by Other 09/01/23 1311)    ED Course/ Medical Decision Making/ A&P                                 Medical Decision Making Risk Prescription drug management.   Bite versus scratch to right face.  High risk of infection.  Discussed with patient.  Tetanus given.  Thorough washout and loose close done.  Please see full procedure note  by Tinnie Ewings, PA-C. Discussed risks of infection and return precautions with the patient Patient reports that is her dog and that the dog's immunizations are up-to-date.  Augmentin  given here in ED and prescription for 10 days.        Final Clinical Impression(s) / ED Diagnoses Final diagnoses:  Wound of right cheek, initial encounter    Rx / DC Orders ED Discharge Orders          Ordered    amoxicillin -clavulanate (AUGMENTIN ) 875-125 MG tablet  Every 12 hours        09/01/23 1451              Levander Houston, MD 09/01/23 1452

## 2023-09-01 NOTE — ED Provider Notes (Signed)
.  Laceration Repair  Date/Time: 09/01/2023 2:32 PM  Performed by: Minnie Tinnie BRAVO, PA Authorized by: Minnie Tinnie BRAVO, PA   Consent:    Consent obtained:  Verbal   Consent given by:  Patient   Risks, benefits, and alternatives were discussed: yes     Risks discussed:  Infection, pain and poor cosmetic result   Alternatives discussed: vs steri strips vs dermabond. Universal protocol:    Patient identity confirmed:  Arm band and verbally with patient Anesthesia:    Anesthesia method:  Local infiltration   Local anesthetic:  Lidocaine  1% WITH epi and lidocaine  2% WITH epi Laceration details:    Location:  Face   Face location:  R cheek   Length (cm):  2 Treatment:    Area cleansed with:  Saline   Amount of cleaning:  Standard   Irrigation solution:  Sterile saline   Irrigation volume:  200   Irrigation method:  Pressure wash Skin repair:    Repair method:  Sutures   Suture size:  6-0   Suture material:  Prolene   Number of sutures:  7 Approximation:    Approximation:  Close Repair type:    Repair type:  Simple Post-procedure details:    Dressing:  Open (no dressing)   Procedure completion:  Tolerated well, no immediate complications     Minnie Tinnie BRAVO, PA 09/01/23 1434    Levander Houston, MD 09/01/23 1713

## 2023-09-01 NOTE — ED Triage Notes (Signed)
 States his pit bull was playing and scratched his face around 9:30am.  Small scratch noted lateral to R eye and approx 3cm laceration to R cheek.  Bleeding controlled.  States dog is up-to-date on immunizations.  Denies pain.

## 2023-09-13 ENCOUNTER — Encounter (HOSPITAL_COMMUNITY): Payer: Self-pay

## 2023-09-13 ENCOUNTER — Ambulatory Visit (HOSPITAL_COMMUNITY)
Admission: EM | Admit: 2023-09-13 | Discharge: 2023-09-13 | Disposition: A | Discharge status: Home / Self Care | Payer: 59 | Attending: Emergency Medicine | Admitting: Emergency Medicine

## 2023-09-13 DIAGNOSIS — Z4802 Encounter for removal of sutures: Secondary | ICD-10-CM | POA: Diagnosis not present

## 2023-09-13 NOTE — ED Provider Notes (Signed)
[facility]    CSN: [id number] Arrival date & time: 09/13/23  1115      History   Chief Complaint Chief Complaint  Patient presents with   Suture / Staple Removal    HPI Omar Sandoval is a 68 y.o. male.   Patient presents to clinic for suture removal. Sutures were placed on 09/01/23 at the ED, facial laceration by patient's dog, 7 sutures were placed. Was told to come back in 5-7 days for removal, presents to clinic on 09/13/23.   Patient did not take Augmentin, did pick it up. Has not had any pain, warmth or drainage.   The history is provided by the patient and medical records.  Suture / Staple Removal    Past Medical History:  Diagnosis Date   CHF (congestive heart failure) (HCC)    Coronary artery disease    HIV (human immunodeficiency virus infection) (HCC)    Hyperlipidemia    Immune deficiency disorder (HCC)    Inguinal hernia    Left   Myocardial infarction [facility])    01-2017 angioplasty with stents    Patient Active Problem List   Diagnosis Date Noted   HFrEF (heart failure with reduced ejection fraction) (HCC) 11/27/2022   Premature ventricular contractions 11/27/2022   Acquired dilation of ascending aorta and aortic root (HCC) 11/27/2022   Tobacco abuse 11/27/2022   Wound infection, dog bite 11/20/2022   Dog bite 11/19/2022   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   Laryngopharyngeal reflux (LPR) 04/24/2021   Essential hypertension 02/08/2019   OSA on CPAP 02/08/2019   Cardiomyopathy, ischemic 02/08/2019   Alcohol abuse 01/19/2018   Cannabis abuse 01/19/2018   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   Left inguinal hernia 07/14/2017   Chronic combined systolic and diastolic heart failure (HCC) 02/28/2017   Hyperlipidemia 02/06/2017   Coronary artery disease involving native coronary artery of native heart with angina pectoris (HCC) 02/06/2017   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 02/2017 02/03/2017   HIV infection (HCC) 01/06/2013   AIN grade  III 01/06/2013   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    Past Surgical History:  Procedure Laterality Date   COLONOSCOPY     CORONARY ANGIOPLASTY     CORONARY/GRAFT ACUTE MI REVASCULARIZATION N/A 02/03/2017   Procedure: Coronary/Graft Acute MI Revascularization;  Surgeon: [name], MD;  Location: [facility];  Service: Cardiovascular;  Laterality: N/A;   FRACTURE SURGERY     left ankle and right foot   HERNIA REPAIR     I & D EXTREMITY Right 11/19/2022   Procedure: IRRIGATION AND DEBRIDEMENT RIGHT LOWER LEG;  Surgeon: Myrene Galas, MD;  Location: [facility] OR;  Service: Orthopedics;  Laterality: Right;   INGUINAL HERNIA REPAIR Left 08/27/2017   Procedure: LAPAROSCOPIC LEFT INGUINAL HERNIA REPAIR WITH MESH;  Surgeon: Axel Filler, MD;  Location: [facility] OR;  Service: General;  Laterality: Left;   INSERTION OF MESH Left 08/27/2017   Procedure: INSERTION OF MESH;  Surgeon: Axel Filler, MD;  Location: [facility] OR;  Service: General;  Laterality: Left;   LEFT HEART CATH AND CORONARY ANGIOGRAPHY N/A 02/03/2017   Procedure: Left Heart Cath and Coronary Angiography;  Surgeon: [name], MD;  Location: [facility]VASIVE CV LAB;  Service: Cardiovascular;  Laterality: N/A;   LEFT HEART CATH AND CORONARY ANGIOGRAPHY N/A 09/25/2018   Procedure: LEFT HEART CATH AND CORONARY ANGIOGRAPHY;  Surgeon: [name], MD;  Location: [facility];  Service: Cardiovascular;  Laterality: N/A;  OPEN REDUCTION INTERNAL FIXATION (ORIF) DISTAL RADIAL FRACTURE Right 07/20/2016   Procedure: OPEN REDUCTION INTERNAL FIXATION (ORIF) DISTAL RADIAL FRACTURE;  Surgeon: [name], MD;  Location: [facility] OR;  Service: Orthopedics;  Laterality: Right;   RIGHT/LEFT HEART CATH AND CORONARY ANGIOGRAPHY N/A 08/02/2022   Procedure: RIGHT/LEFT HEART CATH AND CORONARY ANGIOGRAPHY;  Surgeon: [name], MD;  Location: [facility];  Service: Cardiovascular;  Laterality: N/A;   SURGERY SCROTAL / TESTICULAR      nondecended testes       Home Medications    Prior to Admission medications   Medication Sig Start Date End Date Taking? Authorizing Provider  amoxicillin-clavulanate (AUGMENTIN) 875-125 MG tablet Take 1 tablet by mouth every 12 (twelve) hours. 09/01/23   Margarita Grizzle, MD  aspirin EC 81 MG tablet Take by mouth. 09/20/22   [provider]  atorvastatin (LIPITOR) 40 MG tablet TAKE 1 TABLET BY MOUTH ONCE DAILY AT 6PM . 06/18/22   [name], MD  clopidogrel (PLAVIX) 75 MG tablet TAKE 1 TABLET BY MOUTH ONCE DAILY . APPOINTMENT REQUIRED FOR FUTURE REFILLS 05/13/22   [name], MD  Dolutegravir-lamiVUDine 50-300 MG TABS Take 1 tablet by mouth daily.  06/03/18   [provider]  empagliflozin (JARDIANCE) 10 MG TABS tablet Take 1 tablet (10 mg total) by mouth daily before breakfast. 09/01/23   O'Neal, Ronnald Ramp, MD  metoprolol succinate (TOPROL XL) 25 MG 24 hr tablet Take 1 tablet (25 mg total) by mouth daily. 08/05/23   O'Neal, Ronnald Ramp, MD  nitroGLYCERIN (NITROSTAT) 0.4 MG SL tablet Place 1 tablet (0.4 mg total) under the tongue every 5 (five) minutes x 3 doses as needed for chest pain. 11/27/22   Christell Constant, MD  oxyCODONE-acetaminophen (PERCOCET) 10-325 MG tablet Take 0.5-1 tablets by mouth every 4 (four) hours as needed for pain.    [provider]  sacubitril-valsartan (ENTRESTO) 24-26 MG Take 1 tablet by mouth 2 (two) times daily. 05/03/22   Sharlene Dory, PA-C    Family History Family History  Problem Relation Age of Onset   Cancer Mother        lung   Cancer Brother        lung, liver, and colon    Social History Social History   Tobacco Use   Smoking status: Former    Current packs/day: 0.00    Average packs/day: 0.5 packs/day for 45.0 years (22.5 ttl pk-yrs)    Types: Cigarettes    Start date: 1968    Quit date: 2013    Years since quitting: 12.0   Smokeless tobacco: Never  Vaping Use   Vaping status: Never Used   Substance Use Topics   Alcohol use: Yes    Alcohol/week: 3.0 standard drinks of alcohol    Types: 3 Cans of beer per week    Comment: beer daily   Drug use: Yes    Types: Marijuana    Comment: used  Marijuana     Allergies   Chocolate, Lactose intolerance (gi), Milk (cow), and Vicodin [hydrocodone-acetaminophen]   Review of Systems Review of Systems  Per HPI   Physical Exam Triage Vital Signs ED Triage Vitals  Encounter Vitals Group     BP 09/13/23 1231 100/65     Systolic BP Percentile --      Diastolic BP Percentile --      Pulse Rate 09/13/23 1231 65     Resp 09/13/23 1231 16  Temp 09/13/23 1231 98.1 F (36.7 C)     Temp src --      SpO2 09/13/23 1231 99 %     Weight 09/13/23 1234 174 lb (78.9 kg)     Height 09/13/23 1234 5\' 7"  (1.702 m)     Head Circumference --      Peak Flow --      Pain Score 09/13/23 1233 0     Pain Loc --      Pain Education --      Exclude from Growth Chart --    No data found.  Updated Vital Signs BP 100/65 (BP Location: Left Arm)   Pulse 65   Temp 98.1 F (36.7 C)   Resp 16   Ht 5\' 7"  (1.702 m)   Wt 174 lb (78.9 kg)   SpO2 99%   BMI 27.25 kg/m   Visual Acuity Right Eye Distance:   Left Eye Distance:   Bilateral Distance:    Right Eye Near:   Left Eye Near:    Bilateral Near:     Physical Exam Vitals and nursing note reviewed.  Constitutional:      Appearance: Normal appearance.  HENT:     Head: Normocephalic and atraumatic.     Right Ear: External ear normal.     Left Ear: External ear normal.     Nose: Nose normal.     Mouth/Throat:     Mouth: Mucous membranes are moist.  Eyes:     Conjunctiva/sclera: Conjunctivae normal.  Cardiovascular:     Rate and Rhythm: Normal rate.  Pulmonary:     Effort: Pulmonary effort is normal. No respiratory distress.  Musculoskeletal:        General: Normal range of motion.  Skin:    General: Skin is warm and dry.  Neurological:     General: No focal deficit  present.     Mental Status: He is alert and oriented to person, place, and time.  Psychiatric:        Mood and Affect: Mood normal.        Behavior: Behavior normal.      UC Treatments / Results  Labs (all labs ordered are listed, but only abnormal results are displayed) Labs Reviewed - No data to display  EKG   Radiology No results found.  Procedures Procedures (including critical care time)  Medications Ordered in UC Medications - No data to display  Initial Impression / Assessment and Plan / UC Course  I have reviewed the triage vital signs and the nursing notes.  Pertinent labs & imaging results that were available during my care of the patient were reviewed by me and considered in my medical decision making (see chart for details).  Vitals and triage reviewed, patient is hemodynamically stable.  I does well-approximated to sutured wound to right face.  All the 7 sutures do not appear to be present, has presented 12 days after placement instead of the recommended 5-7.  No warmth, swelling or drainage.  No signs of infection.  Educated on signs and symptoms of infection and to start antibiotics if these present as wounds from a dog have a high rate of infection.  Staff to remove sutures.  Wound care discussed.  Plan of care, follow-up care return precautions given, no questions at this time.     Final Clinical Impressions(s) / UC Diagnoses   Final diagnoses:  Visit for suture removal     Discharge Instructions  Since she did not take your antibiotics, please be cautious for wound infection.  If it starts to get hot, warm, swollen and or starts to drain please start your antibiotics.  Return to clinic for any new or urgent symptoms.    ED Prescriptions   None    PDMP not reviewed this encounter.   [place] Troia, [place], [place] 09/13/23 1246

## 2023-09-13 NOTE — Discharge Instructions (Addendum)
Since she did not take your antibiotics, please be cautious for wound infection.  If it starts to get hot, warm, swollen and or starts to drain please start your antibiotics.  Return to clinic for any new or urgent symptoms.

## 2023-09-13 NOTE — ED Triage Notes (Signed)
Patient here today to have sutures removed from right cheek that he had placed at the ED 10 days ago.

## 2023-10-16 ENCOUNTER — Other Ambulatory Visit (HOSPITAL_COMMUNITY): Payer: Self-pay

## 2023-11-13 ENCOUNTER — Other Ambulatory Visit: Payer: Self-pay | Admitting: Physician Assistant

## 2023-11-14 ENCOUNTER — Other Ambulatory Visit: Payer: Self-pay

## 2023-11-14 MED ORDER — ATORVASTATIN CALCIUM 40 MG PO TABS: ORAL_TABLET | ORAL | 2 refills | Status: AC

## 2023-12-30 ENCOUNTER — Ambulatory Visit: Admitting: Podiatry

## 2023-12-30 ENCOUNTER — Ambulatory Visit (INDEPENDENT_AMBULATORY_CARE_PROVIDER_SITE_OTHER): Admitting: Podiatry

## 2023-12-30 DIAGNOSIS — L84 Corns and callosities: Secondary | ICD-10-CM

## 2023-12-30 DIAGNOSIS — I739 Peripheral vascular disease, unspecified: Secondary | ICD-10-CM

## 2023-12-30 NOTE — Progress Notes (Signed)
 Patient presents for evaluation and treatment of tenderness around nails feet and hyperkeratotic lesions.   Physical exam:  General appearance: Alert, pleasant, and in no acute distress  Vascular: Pedal pulses: DP palpable bilaterally, PT nonpalpable bilaterally.  Moderate edema lower legs bilaterally  Neurological:  Paresthesias noted no burning sensations noted.  Dermatologic:  Hyperkeratotic lesions 11 hyperkeratotic lesions plantar feet bilaterally. Nails thickened and dystrophic 1-5 BL. Skin thinned.  Decreased hair lower limbs BL.  Skin hyperpigmentation lower legs BL.  Musculoskeletal: Hammertoe deformities 2 through 5 laterally   Diagnosis: Painful hyperkeratotic lesions feet bilaterally.  PAD/PVD  Q 8  Plan: Debrided hyperkeratotic lesions feet x 11. Debrided dystrophic thickened nails 1 through 5 bilaterally.  Return 3 months

## 2023-12-31 ENCOUNTER — Ambulatory Visit: Admitting: Podiatry

## 2024-01-23 ENCOUNTER — Ambulatory Visit: Admitting: Podiatry

## 2024-01-30 ENCOUNTER — Ambulatory Visit: Admitting: Podiatry

## 2024-02-20 ENCOUNTER — Ambulatory Visit (INDEPENDENT_AMBULATORY_CARE_PROVIDER_SITE_OTHER): Admitting: Podiatry

## 2024-02-20 ENCOUNTER — Encounter: Payer: Self-pay | Admitting: Podiatry

## 2024-02-20 DIAGNOSIS — I739 Peripheral vascular disease, unspecified: Secondary | ICD-10-CM | POA: Diagnosis not present

## 2024-02-20 DIAGNOSIS — L84 Corns and callosities: Secondary | ICD-10-CM

## 2024-02-20 NOTE — Progress Notes (Signed)
 Patient presents for evaluation and treatment of tenderness around nails feet and hyperkeratotic lesions.   Physical exam:  General appearance: Alert, pleasant, and in no acute distress  Vascular: Pedal pulses: DP 2 bilaterally, PT 0 bilaterally.  Moderate edema lower legs bilaterally  Neurological:    Dermatologic:  Hyperkeratotic lesions x 7 plantar feet bilaterally. Skin thinned.  Decreased hair lower limbs BL.  Skin hyperpigmentation lower legs BL.  Musculoskeletal:     Diagnosis: Painful hyperkeratotic lesions feet bilaterally. PAD/PVD  Q8  Plan: Debrided hyperkeratotic lesions feet x 7.   Return 3 months

## 2024-03-10 ENCOUNTER — Encounter: Payer: Self-pay | Admitting: Podiatry

## 2024-03-10 ENCOUNTER — Ambulatory Visit (INDEPENDENT_AMBULATORY_CARE_PROVIDER_SITE_OTHER): Admitting: Podiatry

## 2024-03-10 DIAGNOSIS — I739 Peripheral vascular disease, unspecified: Secondary | ICD-10-CM

## 2024-03-10 DIAGNOSIS — L84 Corns and callosities: Secondary | ICD-10-CM | POA: Diagnosis not present

## 2024-03-10 NOTE — Progress Notes (Signed)
 Patient presents for evaluation and treatment of tenderness around nails feet and hyperkeratotic lesions.   Physical exam:  General appearance: Alert, pleasant, and in no acute distress  Vascular: Pedal pulses: DP palpable bilaterally, PT nonpalpable bilaterally.  Mild to moderate edema lower legs bilaterally  Neurological:    Dermatologic:  Hyperkeratotic lesions to plantar forefoot right, to plantar forefoot left, 2 mid foot Plantar foot left.  To heels bilaterally Skin thinned.  Decreased hair lower limbs BL.  Skin hyperpigmentation lower legs BL.  Musculoskeletal:     Diagnosis: Painful hyperkeratotic lesions feet bilaterally.  PAD/PVD  Q8  Plan: Debrided hyperkeratotic lesions feet x8.   Return 1 months RFC

## 2024-03-22 ENCOUNTER — Ambulatory Visit: Admitting: Podiatry

## 2024-03-30 ENCOUNTER — Ambulatory Visit (INDEPENDENT_AMBULATORY_CARE_PROVIDER_SITE_OTHER): Admitting: Podiatry

## 2024-03-30 DIAGNOSIS — L84 Corns and callosities: Secondary | ICD-10-CM | POA: Diagnosis not present

## 2024-03-30 DIAGNOSIS — I739 Peripheral vascular disease, unspecified: Secondary | ICD-10-CM | POA: Diagnosis not present

## 2024-03-30 MED ORDER — KETOCONAZOLE 2 % EX CREA: 1.0000 | TOPICAL_CREAM | Freq: Two times a day (BID) | CUTANEOUS | 9 refills | Status: AC

## 2024-03-30 NOTE — Addendum Note (Signed)
 Addended by: CHRISTINE NORLEEN LABOR on: 03/30/2024 03:55 PM   Modules accepted: Orders

## 2024-03-30 NOTE — Progress Notes (Addendum)
 Patient presents for evaluation and treatment of tenderness around nails feet and hyperkeratotic lesions.   Physical exam:  General appearance: Alert, pleasant, and in no acute distress  Vascular: Pedal pulses: DP palpable bilaterally, PT nonpalpable bilaterally .  Mild to moderate edema lower legs bilaterally  Neurological:   Dermatologic:  Hyperkeratotic lesions  3 plantar forefoot right, 2 plantar forefoot left, 2 mid foot Plantar foot left.  2 heels bilaterally . Skin thinned.  Decreased hair lower limbs BL.  Skin hyperpigmentation lower legs BL.  Musculoskeletal:     Diagnosis: Painful hyperkeratotic lesions feet bilaterally.  PAD/PVD  Q8  Plan: -Debrided hyperkeratotic lesions feet x9.  -Will get new prescription for Ketozole  ketoconazole  cream for recurrent tinea pedis bilateral -Rx ketoconazole  cream, apply twice daily to affected areas feet.  Return 3 months RFC

## 2024-05-04 ENCOUNTER — Ambulatory Visit (INDEPENDENT_AMBULATORY_CARE_PROVIDER_SITE_OTHER): Admitting: Podiatry

## 2024-05-04 DIAGNOSIS — I739 Peripheral vascular disease, unspecified: Secondary | ICD-10-CM

## 2024-05-04 DIAGNOSIS — L84 Corns and callosities: Secondary | ICD-10-CM

## 2024-05-04 NOTE — Progress Notes (Signed)
 Patient presents for evaluation and treatment of tenderness around nails feet and hyperkeratotic lesions.    Physical exam:   General appearance: Alert, pleasant, and in no acute distress   Vascular: Pedal pulses: DP palpable bilaterally, PT nonpalpable bilaterally .  Mild to moderate edema lower legs bilaterally   Neurological:    Dermatologic:  Hyperkeratotic lesions  3 plantar forefoot right, 2 plantar forefoot left, 2 mid foot Plantar foot left.  2 heels bilaterally . Skin thinned.  Decreased hair lower limbs BL.  Skin hyperpigmentation lower legs BL.   Musculoskeletal:       Diagnosis: Painful hyperkeratotic lesions feet bilaterally.   PAD/PVD  Q8   Plan: -Debrided hyperkeratotic lesions feet x9.     Return 3 mon RFC

## 2024-05-15 ENCOUNTER — Other Ambulatory Visit: Payer: Self-pay | Admitting: Internal Medicine

## 2024-05-22 ENCOUNTER — Other Ambulatory Visit: Payer: Self-pay | Admitting: Internal Medicine

## 2024-06-03 ENCOUNTER — Ambulatory Visit: Admitting: Podiatry

## 2024-06-08 ENCOUNTER — Ambulatory Visit: Admitting: Podiatry

## 2024-06-08 DIAGNOSIS — L84 Corns and callosities: Secondary | ICD-10-CM | POA: Diagnosis not present

## 2024-06-08 DIAGNOSIS — I739 Peripheral vascular disease, unspecified: Secondary | ICD-10-CM

## 2024-06-08 NOTE — Progress Notes (Signed)
 Patient presents for evaluation and treatment of tenderness around nails feet and hyperkeratotic lesions.   Physical exam:  General appearance: Alert, pleasant, and in no acute distress  Vascular: Pedal pulses: DP palpable bilaterally, PT nonpalpable bilaterally.  Mild to moderate  edema lower legs bilaterally  Neurological:    Dermatologic:  Hyperkeratotic lesions 3 plantar forefoot right, 2 plantar forefoot left, 2 mid foot Plantar foot left. 2 heels bilaterally . SABRA Skin thinned.  Decreased hair lower limbs BL.  Skin hyperpigmentation lower legs BL.  Musculoskeletal:     Diagnosis: 1.  Painful hyperkeratotic lesions feet bilaterally x 9 2.  Q8  Plan: Debrided hyperkeratotic lesions feet x9.   Return 3 months

## 2024-07-07 ENCOUNTER — Encounter: Payer: Self-pay | Admitting: Podiatry

## 2024-07-07 ENCOUNTER — Ambulatory Visit (INDEPENDENT_AMBULATORY_CARE_PROVIDER_SITE_OTHER): Admitting: Podiatry

## 2024-07-07 DIAGNOSIS — L84 Corns and callosities: Secondary | ICD-10-CM | POA: Diagnosis not present

## 2024-07-07 DIAGNOSIS — I739 Peripheral vascular disease, unspecified: Secondary | ICD-10-CM | POA: Diagnosis not present

## 2024-07-07 NOTE — Progress Notes (Signed)
 Patient presents for evaluation and treatment of tenderness around nails feet and hyperkeratotic lesions.   Physical exam:  General appearance: Alert, pleasant, and in no acute distress  Vascular: Pedal pulses: DP palpable bilaterally, PT nonpalpable bilaterally.  Mild to moderate edema lower legs bilaterally  Neurological:    Dermatologic:  Hyperkeratotic lesions 3 plantar forefoot right, 2 plantar forefoot left, 2 mid foot Plantar foot left. 2 heels bilaterally   Skin thinned.  Decreased hair lower limbs BL.  Skin hyperpigmentation lower legs BL.  Musculoskeletal:     Diagnosis: 1.  Painful hyperkeratotic lesions feet bilaterally. 2.  Q8  Plan: Debrided hyperkeratotic lesions feet x 9.   Return 3 months RFC

## 2024-08-06 ENCOUNTER — Ambulatory Visit: Admitting: Podiatry

## 2024-08-06 DIAGNOSIS — I739 Peripheral vascular disease, unspecified: Secondary | ICD-10-CM

## 2024-08-06 DIAGNOSIS — L84 Corns and callosities: Secondary | ICD-10-CM

## 2024-08-06 NOTE — Progress Notes (Signed)
 Patient presents for evaluation and treatment of tenderness around nails feet and hyperkeratotic lesions.   Physical exam:  General appearance: Alert, pleasant, and in no acute distress  Vascular: Pedal pulses: DP palpable bilaterally, PT nonpalpable bilaterally.  Mild to moderate edema lower legs bilaterally  Neurological:    Dermatologic:  Hyperkeratotic lesions 3 plantar forefoot right, 2 plantar forefoot left, 2 mid foot Plantar foot left. 2 heels bilaterally   Skin thinned.  Decreased hair lower limbs BL.  Skin hyperpigmentation lower legs BL.  Musculoskeletal:     Diagnosis: 1.  Painful hyperkeratotic lesions feet bilaterally. 2.  Q8  Plan: Debrided hyperkeratotic lesions feet x 9.   Return 3 months RFC

## 2024-08-28 ENCOUNTER — Other Ambulatory Visit: Payer: Self-pay | Admitting: Cardiovascular Disease

## 2024-09-03 ENCOUNTER — Ambulatory Visit: Admitting: Podiatry

## 2024-09-03 DIAGNOSIS — L84 Corns and callosities: Secondary | ICD-10-CM | POA: Diagnosis not present

## 2024-09-03 NOTE — Progress Notes (Signed)
 Patient presents for evaluation and treatment of tenderness around nails feet and hyperkeratotic lesions.   Physical exam:  General appearance: Alert, pleasant, and in no acute distress  Vascular: Pedal pulses: DP helpful bilaterally, PT nonpalpable bilaterally.  Mild to moderate edema lower legs bilaterally  Neurological:    Dermatologic:  Hyperkeratotic lesions 3 plantar forefoot right 9 (5 mm (, 2 plantar forefoot left (6 mm), 2 mid foot Plantar foot left (5 mm). 2 heels bilaterally (5 mm) Skin thinned.  Decreased hair lower limbs BL.  Skin hyperpigmentation lower legs BL.  Musculoskeletal:     Diagnosis: Painful hyperkeratotic lesions feet bilaterally.  PAD/PVD  Q8  Plan: Debrided hyperkeratotic lesions feet x9.   Return 3 months

## 2024-10-01 ENCOUNTER — Ambulatory Visit: Admitting: Podiatry

## 2024-10-01 DIAGNOSIS — L84 Corns and callosities: Secondary | ICD-10-CM

## 2024-10-01 NOTE — Progress Notes (Signed)
 Patient presents for evaluation and treatment of tenderness around nails feet and hyperkeratotic lesions.   Physical exam:  General appearance: Alert, pleasant, and in no acute distress  Vascular: Pedal pulses: DP palpable bilaterally, PT nonpalpable bilaterally.  Mild to moderate edema lower legs bilaterally  Neurological:   Dermatologic:  Hyperkeratotic lesions  3 plantar forefoot right 9 (5 mm (, 2 plantar forefoot left (6 mm), 2 mid foot Plantar foot left (5 mm). 2 heels bilaterally (5 mm) . Skin thinned.  Decreased hair lower limbs BL.  Skin hyperpigmentation lower legs BL.  Musculoskeletal:     Diagnosis: Painful hyperkeratotic lesions feet bilaterally.  PAD/PVD  Q8  Plan: Debrided hyperkeratotic lesions feet x 9.  Sharply debrided with a 312 chisel blade.   Return 3 months RFC

## 2024-12-29 ENCOUNTER — Ambulatory Visit: Admitting: Podiatry
